# Patient Record
Sex: Male | Born: 1935
Health system: Southern US, Community
[De-identification: ages and names within clinical notes are randomized; demographics above are authoritative.]

## PROBLEM LIST (undated history)

## (undated) DIAGNOSIS — Z951 Presence of aortocoronary bypass graft: Secondary | ICD-10-CM

## (undated) DIAGNOSIS — N400 Enlarged prostate without lower urinary tract symptoms: Secondary | ICD-10-CM

## (undated) DIAGNOSIS — I471 Supraventricular tachycardia, unspecified: Secondary | ICD-10-CM

## (undated) DIAGNOSIS — E785 Hyperlipidemia, unspecified: Secondary | ICD-10-CM

## (undated) DIAGNOSIS — I252 Old myocardial infarction: Secondary | ICD-10-CM

## (undated) DIAGNOSIS — I444 Left anterior fascicular block: Secondary | ICD-10-CM

## (undated) DIAGNOSIS — M1990 Unspecified osteoarthritis, unspecified site: Secondary | ICD-10-CM

## (undated) DIAGNOSIS — M109 Gout, unspecified: Secondary | ICD-10-CM

## (undated) DIAGNOSIS — D494 Neoplasm of unspecified behavior of bladder: Secondary | ICD-10-CM

## (undated) DIAGNOSIS — F32A Depression, unspecified: Secondary | ICD-10-CM

## (undated) DIAGNOSIS — K219 Gastro-esophageal reflux disease without esophagitis: Secondary | ICD-10-CM

## (undated) DIAGNOSIS — E119 Type 2 diabetes mellitus without complications: Secondary | ICD-10-CM

## (undated) DIAGNOSIS — I2510 Atherosclerotic heart disease of native coronary artery without angina pectoris: Secondary | ICD-10-CM

## (undated) DIAGNOSIS — I350 Nonrheumatic aortic (valve) stenosis: Secondary | ICD-10-CM

## (undated) DIAGNOSIS — Z87898 Personal history of other specified conditions: Secondary | ICD-10-CM

## (undated) DIAGNOSIS — N529 Male erectile dysfunction, unspecified: Secondary | ICD-10-CM

## (undated) DIAGNOSIS — Z973 Presence of spectacles and contact lenses: Secondary | ICD-10-CM

## (undated) DIAGNOSIS — Z87448 Personal history of other diseases of urinary system: Secondary | ICD-10-CM

## (undated) DIAGNOSIS — K449 Diaphragmatic hernia without obstruction or gangrene: Secondary | ICD-10-CM

## (undated) DIAGNOSIS — R399 Unspecified symptoms and signs involving the genitourinary system: Secondary | ICD-10-CM

## (undated) DIAGNOSIS — D509 Iron deficiency anemia, unspecified: Secondary | ICD-10-CM

## (undated) DIAGNOSIS — K5730 Diverticulosis of large intestine without perforation or abscess without bleeding: Secondary | ICD-10-CM

## (undated) DIAGNOSIS — Z87438 Personal history of other diseases of male genital organs: Secondary | ICD-10-CM

## (undated) DIAGNOSIS — Z87442 Personal history of urinary calculi: Secondary | ICD-10-CM

## (undated) DIAGNOSIS — I493 Ventricular premature depolarization: Secondary | ICD-10-CM

## (undated) DIAGNOSIS — R011 Cardiac murmur, unspecified: Secondary | ICD-10-CM

## (undated) DIAGNOSIS — I10 Essential (primary) hypertension: Secondary | ICD-10-CM

## (undated) DIAGNOSIS — F329 Major depressive disorder, single episode, unspecified: Secondary | ICD-10-CM

## (undated) DIAGNOSIS — M069 Rheumatoid arthritis, unspecified: Secondary | ICD-10-CM

## (undated) DIAGNOSIS — Q2381 Bicuspid aortic valve: Secondary | ICD-10-CM

## (undated) DIAGNOSIS — Q231 Congenital insufficiency of aortic valve: Secondary | ICD-10-CM

## (undated) HISTORY — PX: TOTAL KNEE ARTHROPLASTY: SHX125

## (undated) HISTORY — DX: Supraventricular tachycardia: I47.1

## (undated) HISTORY — PX: CARDIAC CATHETERIZATION: SHX172

## (undated) HISTORY — DX: Rheumatoid arthritis, unspecified: M06.9

## (undated) HISTORY — DX: Bicuspid aortic valve: Q23.81

## (undated) HISTORY — PX: FINGER SURGERY: SHX640

## (undated) HISTORY — DX: Supraventricular tachycardia, unspecified: I47.10

## (undated) HISTORY — DX: Depression, unspecified: F32.A

## (undated) HISTORY — DX: Congenital insufficiency of aortic valve: Q23.1

## (undated) HISTORY — DX: Nonrheumatic aortic (valve) stenosis: I35.0

## (undated) HISTORY — DX: Left anterior fascicular block: I44.4

## (undated) HISTORY — PX: TRANSTHORACIC ECHOCARDIOGRAM: SHX275

## (undated) HISTORY — DX: Major depressive disorder, single episode, unspecified: F32.9

## (undated) HISTORY — DX: Male erectile dysfunction, unspecified: N52.9

## (undated) HISTORY — PX: CYSTOSCOPY WITH RETROGRADE PYELOGRAM, URETEROSCOPY AND STENT PLACEMENT: SHX5789

---
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
## 1976-12-13 HISTORY — PX: CHOLECYSTECTOMY OPEN: SUR202

## 2000-10-24 ENCOUNTER — Encounter: Payer: Self-pay | Admitting: Orthopedic Surgery

## 2000-10-24 ENCOUNTER — Ambulatory Visit (HOSPITAL_COMMUNITY): Admission: RE | Admit: 2000-10-24 | Discharge: 2000-10-24 | Payer: Self-pay | Admitting: Orthopedic Surgery

## 2001-12-13 HISTORY — PX: CATARACT EXTRACTION W/ INTRAOCULAR LENS  IMPLANT, BILATERAL: SHX1307

## 2002-06-04 ENCOUNTER — Ambulatory Visit (HOSPITAL_COMMUNITY): Admission: RE | Admit: 2002-06-04 | Discharge: 2002-06-04 | Payer: Self-pay | Admitting: Gastroenterology

## 2007-08-23 ENCOUNTER — Inpatient Hospital Stay (HOSPITAL_COMMUNITY): Admission: RE | Admit: 2007-08-23 | Discharge: 2007-08-26 | Payer: Self-pay | Admitting: Orthopedic Surgery

## 2008-03-20 ENCOUNTER — Inpatient Hospital Stay (HOSPITAL_COMMUNITY): Admission: RE | Admit: 2008-03-20 | Discharge: 2008-03-23 | Payer: Self-pay | Admitting: Orthopedic Surgery

## 2008-04-10 ENCOUNTER — Encounter: Payer: Self-pay | Admitting: Thoracic Surgery (Cardiothoracic Vascular Surgery)

## 2008-04-10 ENCOUNTER — Inpatient Hospital Stay (HOSPITAL_COMMUNITY): Admission: EM | Admit: 2008-04-10 | Discharge: 2008-04-17 | Payer: Self-pay | Admitting: Emergency Medicine

## 2008-04-10 ENCOUNTER — Ambulatory Visit: Payer: Self-pay | Admitting: Thoracic Surgery (Cardiothoracic Vascular Surgery)

## 2008-04-11 HISTORY — PX: CORONARY ARTERY BYPASS GRAFT: SHX141

## 2008-05-09 ENCOUNTER — Ambulatory Visit: Payer: Self-pay | Admitting: Thoracic Surgery (Cardiothoracic Vascular Surgery)

## 2008-05-09 ENCOUNTER — Encounter
Admission: RE | Admit: 2008-05-09 | Discharge: 2008-05-09 | Payer: Self-pay | Admitting: Thoracic Surgery (Cardiothoracic Vascular Surgery)

## 2008-05-16 ENCOUNTER — Encounter (HOSPITAL_COMMUNITY): Admission: RE | Admit: 2008-05-16 | Discharge: 2008-08-14 | Payer: Self-pay | Admitting: Cardiology

## 2010-08-13 ENCOUNTER — Inpatient Hospital Stay (HOSPITAL_COMMUNITY): Admission: EM | Admit: 2010-08-13 | Discharge: 2010-08-14 | Payer: Self-pay | Admitting: Emergency Medicine

## 2010-08-14 ENCOUNTER — Encounter (INDEPENDENT_AMBULATORY_CARE_PROVIDER_SITE_OTHER): Payer: Self-pay | Admitting: Diagnostic Radiology

## 2011-02-25 LAB — URINALYSIS, ROUTINE W REFLEX MICROSCOPIC
Bilirubin Urine: NEGATIVE
Hgb urine dipstick: NEGATIVE
Ketones, ur: NEGATIVE mg/dL
Nitrite: NEGATIVE
Urobilinogen, UA: 0.2 mg/dL (ref 0.0–1.0)
pH: 6 (ref 5.0–8.0)

## 2011-02-25 LAB — CBC
Hemoglobin: 12.8 g/dL — ABNORMAL LOW (ref 13.0–17.0)
MCH: 31.9 pg (ref 26.0–34.0)
Platelets: 175 10*3/uL (ref 150–400)
RBC: 4.01 MIL/uL — ABNORMAL LOW (ref 4.22–5.81)
WBC: 7.9 10*3/uL (ref 4.0–10.5)

## 2011-02-25 LAB — DIFFERENTIAL
Eosinophils Absolute: 0.3 10*3/uL (ref 0.0–0.7)
Lymphocytes Relative: 28 % (ref 12–46)
Lymphs Abs: 2.2 10*3/uL (ref 0.7–4.0)
Monocytes Relative: 9 % (ref 3–12)
Neutro Abs: 4.6 10*3/uL (ref 1.7–7.7)
Neutrophils Relative %: 58 % (ref 43–77)

## 2011-02-25 LAB — POCT CARDIAC MARKERS
CKMB, poc: <1 ng/mL — ABNORMAL LOW (ref 1.0–8.0)
Myoglobin, poc: 76.9 ng/mL (ref 12–200)
Troponin i, poc: <0.05 ng/mL (ref 0.00–0.09)

## 2011-02-25 LAB — POCT I-STAT, CHEM 8
BUN: 25 mg/dL — ABNORMAL HIGH (ref 6–23)
Chloride: 114 mEq/L — ABNORMAL HIGH (ref 96–112)
Creatinine, Ser: 1 mg/dL (ref 0.4–1.5)
Glucose, Bld: 109 mg/dL — ABNORMAL HIGH (ref 70–99)
Hemoglobin: 13.3 g/dL (ref 13.0–17.0)
Potassium: 3.9 mEq/L (ref 3.5–5.1)
Sodium: 144 mEq/L (ref 135–145)

## 2011-02-25 LAB — COMPREHENSIVE METABOLIC PANEL
Albumin: 3.5 g/dL (ref 3.5–5.2)
Alkaline Phosphatase: 68 U/L (ref 39–117)
BUN: 21 mg/dL (ref 6–23)
Creatinine, Ser: 1.04 mg/dL (ref 0.4–1.5)
Glucose, Bld: 108 mg/dL — ABNORMAL HIGH (ref 70–99)
Potassium: 4 mEq/L (ref 3.5–5.1)
Total Protein: 6.6 g/dL (ref 6.0–8.3)

## 2011-02-25 LAB — CARDIAC PANEL(CRET KIN+CKTOT+MB+TROPI): Relative Index: INVALID (ref 0.0–2.5)

## 2011-02-25 LAB — URINE MICROSCOPIC-ADD ON

## 2011-02-25 LAB — TSH: TSH: 1.082 u[IU]/mL (ref 0.350–4.500)

## 2011-02-25 LAB — TROPONIN I: Troponin I: 0.02 ng/mL (ref 0.00–0.06)

## 2011-02-25 LAB — CK TOTAL AND CKMB (NOT AT ARMC)
CK, MB: 1.9 ng/mL (ref 0.3–4.0)
Relative Index: INVALID (ref 0.0–2.5)
Total CK: 75 U/L (ref 7–232)

## 2011-02-25 LAB — MAGNESIUM: Magnesium: 1.8 mg/dL (ref 1.5–2.5)

## 2011-04-27 NOTE — Cardiovascular Report (Signed)
Seth Simpson, Seth Simpson                ACCOUNT NO.:  [id number]   MEDICAL RECORD NO.:  [id number]          PATIENT TYPE:  INP   LOCATION:  2905                         FACILITY:  [facility]   PHYSICIAN:  Lyn Records, M.D.   DATE OF BIRTH:  05-Nov-1931   DATE OF PROCEDURE:  DATE OF DISCHARGE:                            CARDIAC CATHETERIZATION   INDICATIONS:  This patient has been having waxing and waning chest pain  since 9:30 this morning.  Initial inferior ST elevation on EKGs at  around 1:30, had resolved to baseline by 2 o'clock p.m.  We still  brought to the cath lab to define coronary anatomy.   PROCEDURE PERFORMED:  1. Left heart catheter.  2. Selective coronary angiography.  3. Left ventriculography.   DESCRIPTION:  After informed consent, the patient was brought under  emergency circumstances.  A 6-French sheath was started on the left  femoral artery because the patient has a fresh less than 2-week right  total knee replacement.  In the cath lab, the patient was complaining of  chest pain and ST elevation noted in the ER had resolved.   A 6-French sheath was inserted and we used a 6-French right Judkins  catheter to perform right coronary angiography selectively and also left  ventriculography.  We then performed left coronary angiography with a #4  6-French left Judkins catheter.  Of note, on each engagement of the left  coronary with Judkins catheter, the patient became restless and  complained of shortness of breath.   We reviewed the digital replace in the cath suite and felt that his  anatomy was quite ambiguous.  My suspicion is that his right coronary  was the culprit vessel, although, his anatomy is such that a large  branching ramus intermedius branch could also be the culprit vessel.  With the patient not clinically stable and with him having basically  three-vessel coronary disease, I made a decision not to proceed with  multivessel PCI in this patient who is  complex.  We will start medical  therapy, reviewed the pictures with my colleague Dr. Corliss Marcus and  also have the surgeons to take a look at the patient and give [place] an  opinion about the feasibility of bypass surgery.  Each opportunity to  help the gentleman right now will be somewhat complicated by the fact  that he has recently had total knee replacement surgery.  Manual  compression was used for left groin hemostasis.   RESULTS:  1. Hemodynamic data:      a.     Aortic pressure 160/80.      b.     Left ventricular pressure 172/11.  2. Left ventriculography:  LV function is normal EF is greater than      60%.  No mitral regurgitation.  3. Coronary angiography.      a.     Left main coronary:  Appears widely patent mildly calcified.      b.     Left anterior descending coronary:  Left anterior descending       is calcified proximally.  Gives  origin to a large diagonal that       arises proximally.  There is eccentric 50%-70% LAD stenosis.  This       is also in the same region from which the first diagonal arises.       The first diagonal has 70%-75% proximal/ostial narrowing.      c.     Ramus intermedius branch:  The ramus trifurcates on the left       lateral wall and contains a 95% proximal stenosis eccentric and       could possibly be the culprit for the patient's presentation.      d.     Circumflex artery:  This vessel appears small and the       subtotally occluded with TIMI grade 3 flow to a small first obtuse       marginal branch less than 2 mm in diameter.      e.     Right coronary:  The right coronary is large vessel that       branches in the mid segment into a large codominant acute marginal       branch that courses along the anterolateral right ventricle and       then to the mid posterior and ventricular groove to supply the PDA       territory.  The right coronary also remains in the       atrioventricular groove and after the acute marginal origin        contains a segmental 95% stenosis that could also be the culprit.       The RCA then courses into the posterior interventricular groove       and supplies the proximal one third of the interventricular       groove.   CONCLUSION:  1. Aborted acute inferior myocardial infarction with spontaneous      resolution of inferior ST elevation and symptoms.  2. Ambiguous coronary anatomy with the possibility that the culprit is      the right coronary or the ramus intermedius branch.  3. Three-vessel coronary disease with high-grade mid right coronary      high-grade ramus intermedius subtotal circumflex and moderate-to-      moderately severe proximal left anterior descending and first      diagonal disease.  4. Normal left ventricular function.  5. Dilated aortic root with aortic calcification.   PLAN:  1. We will review digital images with Dr. Amil Amen.  2. We will have Dr. Jannette Spanner and see the patient from [facility] to assess the      patient's surgical possibilities.  3. We will start IV integralin.  The patient has already been given      aspirin.  We will aggressively treat his blood pressure.  We will      start IV nitroglycerin and statin therapy.  4. The patient may need to return back to the cath lab if recurrent ST      elevation, but hopefully aggressive medical therapy can cool him      off and allow [place] to make a more powerful decision about surgery      versus multivessel PCI.      Lyn Records, M.D.  Electronically Signed     HWS/MEDQ  D:  04/10/2008  T:  04/11/2008  Job:  [id number]   cc:   Francisca December, M.D.  Jaquelyn Bitter. Chabon, P.A.

## 2011-04-27 NOTE — Assessment & Plan Note (Signed)
OFFICE VISIT   Seth Simpson, Seth Simpson  DOB:  [birth year]                                        May 09, 2008  CHART #:  [id number]   HISTORY OF PRESENT ILLNESS:  The patient is status post coronary artery  bypass grafting times 5 done by Dr. Dorris Fetch April 11, 2008.  The  patient did well postoperatively with the exception of mild anemia and  an episode of atrial fibrillation which was converted back to normal  sinus rhythm prior to discharge home.  He presents today for his 3-week  followup visit.  The patient states he is feeling well and is without  complaints.  He has seen Dr. Amil Amen last Thursday who has been managing  his Coumadin.  Cardiac rehab has contacted the patient and is scheduled  to meet with them next Thursday.  The patient is ambulating 3 to 4 times  per day without difficulty.  He denies any chest pain, shortness of  breath, fevers, nausea, vomiting, lightheadedness, dizziness.  His  appetite is stable.   PHYSICAL EXAM:  Vital Signs:  Blood pressure of 135/81, pulse 69,  respirations 80 with O2 sats 90% on room air.  Respiratory:  Clear to  auscultation bilaterally.  Cardiac:  Regular rate and rhythm.  S1-S2  noted.  He does have a 2/6 to 3/6 systolic ejection murmur noted.  Abdomen:  Benign.  Extremities:  No peripheral edema noted.  He does  have an area of fluid collection at his distal AVH site on his left  lower extremity, but no cellulitic area noted to this.  It is not tender  to touch.   INCISIONS:  All incisions are clean, dry and intact and healing well.   STUDIES:  The patient had PA and lateral chest x-ray done today which is  clear.  No signs atelectasis or effusions.   IMPRESSION AND PLAN:  The patient is status post coronary artery bypass  grafting about 3 weeks post-op.  The patient is progressing well.  He  was seen and evaluated by Dr. Dorris Fetch.  Plan is for the patient to  continue all current medications and follow  up with Dr. Amil Amen as  directed.  Coumadin dosing will be based per Dr. Amil Amen.  The patient  is to follow up with his orthopedic surgeon as well pertaining to his  knee replacement.  He is encouraged to do cardiac rehab.  He is to  continue to ambulate 3 to 4 times per day.  The patient is to contact us  if he develops any fevers, opening or drainage from any of his incision  sites, or his fluid collection on his left lower extremity becomes  tender, warm to touch, or erythematous.  The patient acknowledged  understanding.  The patient is instructed he is released to drive, but  told no heavy lifting over 10 pounds for another 2 to 4 weeks.  We will  release the patient from our office and follow up as needed.  The  patient is in agreement.   Salvatore Decent Dorris Fetch, M.D.  Electronically Signed   KMD/MEDQ  D:  05/09/2008  T:  05/09/2008  Job:  [id number]   cc:   Francisca December, M.D.

## 2011-04-27 NOTE — Op Note (Signed)
Seth Simpson, Seth Simpson                ACCOUNT NO.:  [id number]   MEDICAL RECORD NO.:  [id number]          PATIENT TYPE:  INP   LOCATION:  0010                         FACILITY:  [facility]   PHYSICIAN:  Ollen Gross, M.D.    DATE OF BIRTH:  [birth year]   DATE OF PROCEDURE:  03/20/2008  DATE OF DISCHARGE:                               OPERATIVE REPORT   PREOPERATIVE DIAGNOSIS:  Osteoarthritis, right knee.   POSTOPERATIVE DIAGNOSIS:  Osteoarthritis, right knee.   PROCEDURE:  Right total knee arthroplasty.   SURGEON:  Ollen Gross, M.D.   ASSISTANT:  Alexzandrew L. Perkins, P.A.C.   ANESTHESIA:  General with postop Marcaine pain pump.   ESTIMATED BLOOD LOSS:  Minimal.   DRAINS:  None.   TOURNIQUET TIME:  30 minutes at 300 mmHg.   COMPLICATIONS:  None.   CONDITION:  Stable to recovery.   BRIEF CLINICAL NOTE:  Mr. Capri is a 77 year old male who has end  stage arthritis of the right knee with progressively worsening pain and  dysfunction.  He has had a previous successful left total knee  arthroplasty and presents now for right total knee arthroplasty.   PROCEDURE IN DETAIL:  After the successful administration of general  anesthetic, a tourniquet was placed high on the right thigh, and the  right lower extremity is prepped and draped in the usual sterile  fashion.  The extremity is wrapped in Esmarch, knee flexed, and  tourniquet inflated to 300 mmHg.  A midline incision was made with a 10  blade through the subcutaneous tissue to the level of the extensor  mechanism.  A fresh blade was used to make a medial parapatellar  arthrotomy.  Soft tissue over the proximal medial tibia was  subperiosteally elevated to the joint line with the knife and into the  semimembranosus bursa with a Cobb elevator.  The soft tissue laterally  was elevated with attention being paid to avoid the patellar tendon on  the tibial tubercle.  The patella was subluxed laterally and the knee  flexed 90  degrees.  The ACL and PCL were removed.  A drill was used to  create a starting hole in the distal femur and the canal was thoroughly  irrigated.  The 5 degrees right valgus alignment guide is placed  referencing off the posterior condyles. Rotation is marked and the block  pinned to remove 11 mm off the distal femur.  I took 11 mm because of  preop flexion contracture.  Distal femoral resection is made with an  oscillating saw.  Sizing block is placed, size 4 is the most  appropriate.  Rotation is marked at the epicondylar axis.  Size 4  cutting block is placed and the anterior, posterior, and chamfer cuts  made.   The tibia is subluxed forward and menisci removed.  Extramedullary  tibial alignment guide was placed referencing proximally at the medial  aspect of the tibial tubercle and distally along the second metatarsal  axis and tibial crest.  The block is pinned to remove 2 mm off the more  deficient medial side.  This is  a thicker resection than usual because  the medial side was very deficient.  The tibial resection is made with  an oscillating saw.  Size 4 is the most appropriate tibial component and  the proximal tibia prepared with the modular drill and keel punch for a  size 4.  Femoral preparation is completed with the intercondylar cut.   A size 4 mobile bearing tibial trial, size 4 posterior stabilized  femoral trial, and a 12.5 mm posterior stabilized rotating platform  insert trial are placed.  The 12.5 was not thick enough and I went to  15.  With 15, we had full extension with excellent varus valgus anterior  and posterior balance throughout full range of motion.  The patella was  everted and thickness measured to be 24 mm.  Freehand resection is taken  to 14 mm, a 38 template is placed, lug holes were drilled, trial patella  is placed and it tracks normally.  Osteophytes were removed from the  posterior femur with the trial in place.  All trials are removed and the   cut bone surfaces are prepared with pulsatile lavage.  The cement is  mixed and once ready for implantation, a size 4 mobile bearing tibial  tray, size 4 posterior stabilized femur, and 38 patella are cemented  into place and the patella is held with a clamp.  The trial 15 mm insert  is placed and the knee held in full extension and all extruded cement  removed.  When the cement had fully hardened, then the wound was  copiously irrigated with saline solution.  FloSeal was injected on the  posterior capsule and the permanent 15 mm posterior stabilized rotating  platform insert is placed into the tibial tray.  The FloSeal is injected  in the medial and lateral gutters and suprapatellar area.  A moist  sponge is placed and the tourniquet released for a total time of 30  minutes.  The sponge is held for two minutes then removed.  Minimal  bleeding is encountered.  That which is encountered is stopped with  electrocautery.  I then copiously irrigated again and closed the  extensor mechanism with interrupted #1 PDS.  Flexion against gravity was  135 degrees.  The subcu was closed with interrupted 2-0 Vicryl and  subcuticular running 4-0 Monocryl.  The incisions were cleaned and  dried.  The catheter for the Marcaine pain pump is placed and the pump  is initiated.  Steri-Strips were placed and he is placed into a bulky  dressing, awakened, and transferred to recovery in stable condition.      Ollen Gross, M.D.  Electronically Signed     FA/MEDQ  D:  03/20/2008  T:  03/20/2008  Job:  [id number]

## 2011-04-27 NOTE — Discharge Summary (Signed)
NAMEBERDELL, HOSTETLER                ACCOUNT NO.:  [id number]   MEDICAL RECORD NO.:  [id number]          PATIENT TYPE:  INP   LOCATION:  1621                         [facility]ACILITY:  [facility]   PHYSICIAN:  Ollen Gross, M.D.    DATE OF BIRTH:  1931-04-12   DATE OF ADMISSION:  03/20/2008  DATE OF DISCHARGE:  03/23/2008                               DISCHARGE SUMMARY   ADMISSION DIAGNOSES:  1. Osteoarthritis right knee.  2. Hypertension.  3. Coronary arterial disease.  4. Gastroesophageal reflux disease.   DISCHARGE DIAGNOSES:  1. Osteoarthritis right knee status post right total knee      arthroplasty.  2. Mild postop blood loss anemia.  Did not require transfusion.  Remainder of discharge diagnoses same as admitting diagnoses.   PROCEDURE:  March 20, 2008 right total knee surgery by Dr. Merlyn Albert  assisted by Patrica Duel, P.A.-C.  Anesthesia general.  Tourniquet time 30 minutes.   CONSULTATIONS:  None.   BRIEF HISTORY:  Mr. Hollings is a 77 year old male with end-stage  arthritis of the right knee with progressive  worsening pain and  dysfunction with a previous successful left total knee, now presents for  a right total knee.   LABORATORY DATA:  Preop CBC:  Hemoglobin 14.6, hematocrit of 41.6, white  cell count 6.4.  Platelets 189.  Hemoglobin dropped to 11.6 postop and  then to 9.8 with a hematocrit of 27.0.  Hemoglobin stabilized.  Last  known H&H was 9.4, hematocrit stabilized at 27.4.  PT PTT preop 13.5 and  31 respectively.  INR 1.0  Serial pro times all less than PT INR 32.2  and 3.0.  CHEM panel on admission all within normal limits.  Serial  albumins and all electrolytes remained within normal limits.  Preop UA  negative.  Blood retype O positive.  EKG:  The date on the EKG is cut off by the fax.  It was sent over by  [facility].  Said normal EKG.  Just unable to read the full date.   HOSPITAL COURSE:  The patient was admitted to [facility].  Tolerated  procedure well.  Later transferred from recovery room to  orthopedic floor.  Started on PCA and pain analgesics.  Given Coumadin  for DVT prophylaxis.  Given 24 hour postop IV antibiotics  postoperatively.  Had a fair amount of pain through the night  and had  to increase his PCA, but he was doing a little bit better.  Started  working on his pain control.  Had good output the next morning, over  300.  Hemoglobin was stable.  Blood pressure was a little low, so we put  his blood pressure medications on parameters and used fluids.  Serum  glucose was elevated so we took the D5 out and changed him over to  normal saline.  Got up and did extremely well with therapy that day.  On  postop day #1, walked about 120 feet.  Doing much better by day #2.  Sleeping well.  Pain was under better control.  Hemoglobin was down a  little bit to 9.8,  but he was asymptomatic with it.  Dressing was  changed and incision looked good.  Continued to progress well.  Walking  over 100 feet and then the next day 180 feet, and then 200 feet.  Progressed very well with physical therapy, and by March 23, 2008, he  was doing well.  No complaints.  He was discharged home.   DISCHARGE PLAN:  Patient was discharged home  on March 23, 2008.   DISCHARGE DIAGNOSIS:  Please see above.   DISCHARGE MEDICATIONS:  Percocet, Robaxin, Nu-Iron and Coumadin.   DIET:  Heart healthy diet.   ACTIVITY:  Weightbearing as tolerated.  Right leg total knee protocol.  Home health PT and home health nursing.  Followup two weeks.   DISPOSITION:  Home.   CONDITION ON DISCHARGE:  Improved.      Alexzandrew L. Perkins, P.A.C.      Ollen Gross, M.D.  Electronically Signed    ALP/MEDQ  D:  04/25/2008  T:  04/25/2008  Job:  [id number]   cc:   Ollen Gross, M.D.  Fax: [phone]

## 2011-04-27 NOTE — Discharge Summary (Signed)
NAME:  Seth Simpson, Seth Simpson                ACCOUNT NO.:  [id number]   MEDICAL RECORD NO.:  [id number]          PATIENT TYPE:  INP   LOCATION:  2015                         FACILITY:  [facility]   PHYSICIAN:  Salvatore Decent. Dorris Fetch, M.D.DATE OF BIRTH:  1930/12/22   DATE OF ADMISSION:  04/10/2008  DATE OF DISCHARGE:  04/17/2008                               DISCHARGE SUMMARY   ADMITTING DIAGNOSES:  1. ST-segment elevation myocardial infarction.  2. History of coronary artery disease.  3. History of hypertension.  4. History of hypercholesterolemia.  5. History of benign prostatic hypertrophy.  6. History of degenerative arthritis.  7. Status post right total knee replacement 3 weeks ago.   DISCHARGE DIAGNOSES:  1. ST-segment elevation myocardial infarction.  2. History of coronary artery disease.  3. History of hypertension.  4. History of hypercholesterolemia.  5. History of benign prostatic hypertrophy.  6. History of degenerative arthritis.  7. Status post right total knee replacement 3 weeks ago.  8. Postop atrial fibrillation.   PROCEDURES:  1. Cardiac catheterization performed on April 10, 2008, by Dr. Verdis Prime.  2. Coronary artery bypass graft x5 (left internal mammary artery to      ramus intermedius, saphenous vein graft to diagonal 1, saphenous      vein graft to left anterior descending, sequential saphenous vein      graft to acute marginal and distal right coronary with endoscopic      vein harvest of the left thigh, and open harvest of the right lower      extremity done on April 11, 2008, by Dr. Dorris Fetch).   HOSPITAL COURSE STAY:  This is a 77 year old male with known coronary  artery disease, who is status post right total knee replacement  approximately 3 weeks ago.  Several days prior to his admission to [facility], he had a brief episode of substernal chest discomfort, which  slowly resolved.  On the morning of April 10, 2008, he developed severe  substernal  chest pain that became rather persistent and he was brought  via EMS to [facility].  A 12-lead EKG showed ST-segment  elevation inferiorly.  The patient was given aspirin and nitroglycerin  and his chest pain resolved.  He then underwent a cardiac  catheterization on April 10, 2008, and he was found to have multivessel  coronary artery disease (50-70% LAD stenosis, 70-75% proximal ostial  narrowing of diagonal 1, 95% proximal stenosis of the ramus intermedius,  95% stenosis of the RCA, and left ventricular function was 60%.  The  patient underwent CABG x5.  Postoperatively, the patient was anemic.  His H&H was monitored closely.  He did remain asymptomatic.  Chest tubes  were removed on Apr 12, 2008.  Followup x-ray on Apr 13, 2008, revealed no  pneumothorax.  The patient had brief atrial flutter on Apr 13, 2008, and  then developed atrial fibrillation with RVR.  He was placed on an  amiodarone drip and then placed on p.o. amiodarone.  Coumadin was also  initiated.  He then converted to normal sinus  rhythm.  In addition, he  was volume overloaded.  He was diuresed daily.  The patient's H&H did  drop to 7.3 and 21.6 on postop day #4 (Apr 15, 2008).  He was given 2  units of packed red blood cells.  The patient was doing well with  cardiac rehab and continuing to pursue to the postop day #5.  He had  some sternal incisional pain and headache as well as some right great  toe pain, which he attributed to gout (which he had a previous history).  The patient was afebrile.  He remained hemodynamically stable.  His  weight was 67.8 kg, down from his preop weight of 70 kg.  His H&H was  10.1 and 30 respectively.  PT and INR were 20.4 and 1.7.   PHYSICAL EXAMINATION:  CARDIOVASCULAR:  Regular rate and rhythm.  Grade  1 systolic murmur.  PULMONARY:  Clear to auscultation bilaterally.  EXTREMITIES:  Trace edema.  His sternal and lower extremity wounds were  clean and dry.  No sign of  infection.  Positive bilateral thigh  ecchymoses.  The patient is doing well and surgically stable.   He is most likely going to be discharged on Apr 17, 2008.  Last chest x-  ray was done on Apr 13, 2008, which showed left lower lobe atelectasis  and tiny bilateral pleural effusions.  No pneumothorax.   LAST LABORATORY STUDIES:  CBC done Apr 16, 2008, H&H of 10.1 and 30  respectively, white count D100, and platelet count 237,000.  BMET done  on Apr 16, 2008, potassium 40, BUN and creatinine were 11 and 0.92  respectively.  PT and INR as stated previously 20.4 and 1.7  respectively.   DISCHARGE INSTRUCTIONS:  The patient is not to drive or lift more than  10 pounds until instructed otherwise.  He is to continue with breathing  exercise (uses incentive spirometer daily, to walk daily and increase  the number of time and length of walks as tolerated).  He is to remain  on low-fat, low-salt diet.   FOLLOWUP APPOINTMENTS:  His followup appointments include Dr. Amil Amen in  2 weeks.  He needs to call their office for an appointment.  Dr.  Dorris Fetch on May 09, 2008.  A chest x-ray will be obtained prior to  this appointment.  He will need to have a PT and INR drawn on Thursday,  results faxed to Dr. Amil Amen' office.  We will continue to monitor his  PT and INR and adjust his Coumadin accordingly.   DISCHARGE MEDICATIONS:  Include the following:  1. Terazosin 5 mg p.o. daily.  2. Ranitidine 10 mg p.o. daily.  3. ECASA 1 mg p.o. daily.  4. Methocarbamol 500 mg p.o. q.i.d. p.r.n.  5. Hydrocodone 5/500, 1-2 tablets p.o. q.4-6 h. p.r.n. pain.  6. Coumadin 2 mg p.o. daily or as directed.  7. Zocor 20 mg p.o. at bedtime.  8. Toprol-XL 50 mg p.o. daily.  9. Amiodarone 400 mg p.o. twice daily.  On Apr 19, 2008, the patient is      to take 200 mg p.o. twice daily until he sees his cardiologist.      The patient's epicardial pacing wires will be removed today and      chest tube sutures removed in  the a.m.      Doree Fudge, PA      Viviann Spare C. Dorris Fetch, M.D.  Electronically Signed    DZ/MEDQ  D:  04/16/2008  T:  04/17/2008  Job:  [id number]

## 2011-04-27 NOTE — H&P (Signed)
Seth Simpson, Seth Simpson                ACCOUNT NO.:  [id number]   MEDICAL RECORD NO.:  [id number]          Simpson TYPE:  INP   LOCATION:  0010                         FACILITY:  [facility]   PHYSICIAN:  Ollen Gross, M.D.    DATE OF BIRTH:  1931-02-19   DATE OF ADMISSION:  08/23/2007  DATE OF DISCHARGE:                              HISTORY & PHYSICAL   CHIEF COMPLAINT:  Left knee pain.   HISTORY OF PRESENT ILLNESS:  Seth Simpson is a 77 year old male who has  been seen by Dr. Lequita Halt for ongoing left knee pain.  Seth Simpson has known  arthritis and found to have some bone on bone in medial compartment. his  pain has been progressive in nature and now at a point where Seth Simpson would  like to have something done about it.  Risks and benefits of Seth total  knee replacement have been discussed with Seth Simpson.  Seth Simpson elects to  proceed with surgery.   ALLERGIES:  NO KNOWN DRUG ALLERGIES.   CURRENT MEDICATIONS:  Imdur, Zocor, Hytrin Lotrel, Toprol and Claritin.   PAST MEDICAL HISTORY:  Hypertension, coronary arterial disease,  gastroesophageal reflux disease.   PAST SURGICAL HISTORY:  Appendectomy, vasectomy, ACL knee reconstructive  surgery, gallbladder and lens implants.   SOCIAL HISTORY:  Married.  Retired.  Nonsmoker.  Occasional glass of  wine.  Two children.  Spouse will be assisting with care after surgery.   FAMILY HISTORY:  Father with a history of heart disease.  Brother with a  history of heart disease.  Mother with arthritis and hypertension.  Sister with breast cancer and another brother with COPD.   REVIEW OF SYSTEMS:  GENERAL:  No fevers, chills, night sweats.  NEURO:  No seizures, syncope or paralysis.  Respiratory: No shortness of breath,  productive cough or hemoptysis.  CARDIOVASCULAR: Seth Simpson had a cardiac  event approximately 12 ago.  Seth Simpson did have a cardiac catheterization and  found to have some nonobstructive coronary arterial disease.  Seth Simpson denies  any chest pain, angina, orthopnea.  GI:  No nausea, vomiting, diarrhea or  constipation.  GU:  Little bit of urgency, nocturia.  No dysuria or  hematuria.  MUSCULOSKELETAL:  Left knee.   PHYSICAL EXAM:  VITAL SIGNS:  Pulse 68, respirations 12, blood pressure  138/76.  GENERAL:  A 77 year old white male, well nourished, well developed, in  no acute distress.  Good historian.  Accompanied by his wife.  Seth Simpson is  alert, oriented and cooperative.  HEENT:  Normocephalic, atraumatic.  Pupils are round and reactive.  Oropharynx clear.  EOM's Intact.  NECK:  Supple.  CHEST: Clear anterior and posterior chest walls.  No rhonchi, rales or  wheezing.  HEART: Regular rate and rhythm with grade II/VI systolic ejection murmur  heard over aortic and also Seth mitral and tricuspid points of Seth heart,  anterior chest.  ABDOMEN:  Soft, flat, nontender.  Bowel sounds present.  BREASTS/GENITALIA:  Not done, not pertinent to present illness.  EXTREMITIES:  Left knee, no effusion, slight varus malalignment  deformity.  Seth Simpson does have marked crepitus noted on passive  range of  motion.  No instability.   IMPRESSION:  1. Osteoarthritis left knee.  2. Hypertension.  3. Coronary arterial disease.  4. Gastroesophageal reflux disease   PLAN:  Seth Simpson admitted to [facility] to undergo a left  total knee replacement arthroplasty.  Surgery will be performed by Dr.  Ollen Gross.      Alexzandrew L. Perkins, P.A.C.      Ollen Gross, M.D.  Electronically Signed    ALP/MEDQ  D:  08/22/2007  T:  08/23/2007  Job:  [id number]   cc:   Candyce Churn, M.D.  Fax: [phone]   Ollen Gross, M.D.  Fax: [phone]

## 2011-04-27 NOTE — Op Note (Signed)
NAMEEDEN, RHO                ACCOUNT NO.:  [id number]   MEDICAL RECORD NO.:  [id number]          PATIENT TYPE:  INP   LOCATION:  0010                         FACILITY:  [facility]   PHYSICIAN:  Ollen Gross, M.D.    DATE OF BIRTH:  1931-11-21   DATE OF PROCEDURE:  08/23/2007  DATE OF DISCHARGE:                               OPERATIVE REPORT   PREOPERATIVE DIAGNOSIS:  Osteoarthritis left knee.   POSTOPERATIVE DIAGNOSIS:  Osteoarthritis left knee.   PROCEDURE:  Left total knee arthroplasty.   SURGEON:  Ollen Gross, M.D.   ASSISTANT:  Avel Peace PA-C   ANESTHESIA:  Spinal with Duramorph.   ESTIMATED BLOOD LOSS:  Minimal.   DRAINS:  None.   TOURNIQUET TIME:  28 minutes at 300 mmHg.   COMPLICATIONS:  None.   CONDITION:  Stable to recovery.   BRIEF CLINICAL NOTE:  Mr. Schuenemann is a 77 year old male with severe end-  stage arthritis left knee with progressively worsening pain and  dysfunction.  He has failed nonoperative management and presents now for  left total knee arthroplasty.   PROCEDURE IN DETAIL:  After successful administration of spinal  anesthetic, a tourniquet is placed on the left thigh and left lower  extremity prepped and draped in usual sterile fashion.  Extremity was  wrapped in Esmarch, knee flexed, tourniquet inflated 300 mmHg.  Midline  incision made an 10 blade through subcutaneous tissue to the level of  the extensor mechanism.  Fresh blade used make a medial parapatellar  arthrotomy.  Soft tissue over the proximal medial tibia subperiosteally  elevated to the joint line with the knife and into the semimembranosus  bursa with a Cobb elevator.  Soft tissue laterally is elevated with  attention being paid to avoiding patellar tendon on tibial tubercle.  Patella subluxed laterally, knee flexed 90 degrees, ACL and PCL removed.  Drill was used to create a starting hole in the distal femur and the  canal was thoroughly irrigated.  5 degrees left valgus  alignment guide  was placed and referencing off the posterior condyles rotations marked  and the block pinned to remove 10 mm off the distal femur.  Distal  femoral resection is made with an oscillating saw.  Sizing blocks placed  and size 4 is most appropriate.  Rotation is marked off the epicondylar  axis and the size 4 cutting block placed.  The anterior, posterior and  chamfer cuts are made.   Tibia subluxed forward and menisci are removed.  Extramedullary tibial  alignment guides placed referencing proximally at the medial aspect of  the tibial tubercle and distally along the second metatarsal axis and  tibial crest.  Blocks pinned to remove 10 mm off the non deficient  lateral side.  Tibial resection is made with an oscillating saw.  Size 4  is the most appropriate tibial component and the proximal tibia is  prepared with the modular drill and keel punch for a size 4.  Femoral  preparation is completed with intercondylar cut.   Size 4 mobile bearing tibial trial, size 4 posterior stabilized femoral  trial  and a 10 mL posterior stabilized rotating platform insert trial  are placed.  With a 10 he has a tiny bit of varus-valgus play, so I went  to 12.5 which led to outstanding stability from full flexion down  through full extension.  We then everted the patella and the thickness  was measured to be 24 mm.  Freehand resection taken to 14 mm, 38  template is placed, lug holes were drilled, trial patella was placed and  it tracks normally.  Osteophytes were removed off the posterior femur  with the trial in place.  All trials removed and the cut bone surfaces  are prepared with pulsatile lavage.  Cement was mixed and once ready for  implantation a size 4 mobile bearing tibial trial, size 4 posterior  stabilized femoral trial and a 12.5 mm posterior stabilized rotating  platform insert trial are placed.  The knee was held in full extension  and all extruded cement removed.  We then also  cemented the 38 patella  in and held it with patellar clamp.  Once the cement is hardened, then  we removed the trial and thoroughly irrigated the joint.  FloSeal was  placed on the posterior capsule and the permanent 12.5 mm posterior  stabilized rotating platform insert is placed.  We then placed the  FloSeal in the suprapatellar area, medial and lateral gutters.  Moist  sponge is placed and the tourniquet released with a total time of 28  minutes.  Then we held pressure about two minutes, removed the sponge  and minimal bleeding was encountered.  Any encountered bleeding was  stopped with electrocautery.  Wound again irrigated and the arthrotomy  closed with interrupted #1 PDS.  Flexion against gravity to 140 degrees.  Subcu was closed with interrupted 2-0 Vicryl subcuticular running 4-0  Monocryl.  The incisions cleaned and dried and Steri-Strips and bulky  sterile dressing applied.  Placed into a knee immobilizer, awakened and  transferred to recovery in stable condition.      Ollen Gross, M.D.  Electronically Signed     FA/MEDQ  D:  08/23/2007  T:  08/24/2007  Job:  [id number]

## 2011-04-27 NOTE — Op Note (Signed)
NAME:  Winningham, Wilkie                ACCOUNT NO.:  [id number]   MEDICAL RECORD NO.:  [id number]          PATIENT TYPE:  INP   LOCATION:  2312                         FACILITY:  [facility]   PHYSICIAN:  Salvatore Decent. Dorris Fetch, M.D.DATE OF BIRTH:  [birth year]   DATE OF PROCEDURE:  04/11/2008  DATE OF DISCHARGE:                               OPERATIVE REPORT   PREOPERATIVE DIAGNOSIS:  Three-vessel coronary artery disease status  post acute myocardial infarction.   POSTOPERATIVE DIAGNOSIS:  Three-vessel coronary artery disease status  post acute myocardial infarction.   PROCEDURE:  1. Median sternotomy, extracorporeal circulation.  2. Coronary artery bypass grafting x5 (left internal mammary artery to      ramus intermedius, saphenous vein graft to first diagonal,      saphenous vein graft to left anterior descending, sequential      saphenous vein graft to acute marginal and distal right coronary).  3. Endoscopic vein harvest of the left thigh combination endoscopic      and open vein harvest right thigh.   SURGEON:  Salvatore Decent. Dorris Fetch, MD.   ASSISTANT:  Allyn Kenner, MD.   SECOND ASSISTANT:  Doree Fudge, PA.   ANESTHESIA:  General.   FINDINGS:  Hematuria with bleeding around the catheter noted on patient  arrival to room.  Catheter was left in place.  Good-quality coronary  targets for the ramus intermedius, diagonal and LAD, acute marginal, and  distal right coronary with severe plaquing at the bifurcation with fair-  quality target vessels.  Atypical course for the distal right coronary  artery.  Large segments of saphenous vein not acceptable for use as  bypass grafts.  Adequate vein was found for the three bypasses after  harvesting vein from the left leg and right thigh.   CLINICAL NOTE:  Mr. Kritikos is a 77 year old gentleman who recently had  total knee replacement.  He presented to the emergency room with  complaints of substernal chest pain.  He initially had  ST elevation.  He  was taken emergently to the catheterization laboratory by Dr. Verdis Prime and was found to have severe three-vessel disease but only  moderate disease in the LAD.  He stabilized medically and did not  require emergent intervention.  He was seen in consultation by Dr.  Tressie Stalker who recommended coronary bypass grafting with me  performing the surgery today.  The patient was in agreement.  The  patient was seen by Dr. Corliss Marcus on the morning of surgery who  reviewed the films, discussed with Dr. Katrinka Blazing and concurred with our  assessment.  The indications, risks, benefits and alternatives were  discussed with the patient.  He understood and accepted the risks and  agreed to proceed.   OPERATIVE NOTE:  Mr. Seelman was brought to the preop holding area on  April 30, 75.  There, the Anesthesia Service under the direction of  Dr. Judie Petit placed lines for monitoring arterial, central  venous, and pulmonary arterial pressure.  Intravenous antibiotics were  administered.  The patient was taken to the operating room, anesthetized  and intubated.  There it was noted to be blood around the Foley catheter  and notable hematuria.  The catheter was left in place and not replaced  with a temperature probe catheter.  The chest, abdomen and legs were  prepped and draped in the usual sterile fashion.   Incision was made in the medial aspect of the left leg at the level of  the knee.  The greater saphenous vein was identified.  It was harvested  endoscopically from groin to mid calf.  After removing the vein and  inspecting only a small portion of this vein was acceptable for use as a  bypass graft.  Simultaneously with this median sternotomy was performed  in the left internal mammary artery was harvested using standard  technique.  A 2000 units of heparin was administered during the vessel  harvest.  Next, an attempt was made to harvest the saphenous vein from  the  right leg endoscopically.  Incision was made just below the knee.  The vein was identified and a scope was inserted.  Vein was tracked  proximally and there was a proximal portion of the vein which was  suitable for use as a bypass graft, however, there was a long sclerotic  portion in the mid portion of the vein which was not easily accessible  endoscopically.  Bridged incisions then were used to harvest the  remainder of the vein.  Again, there was sufficient vein for the bypass  grafts to be utilized.   The patient then was fully heparinized and the pericardium was opened.  The ascending aorta was inspected with no evidence of atherosclerotic  disease.  The aorta was cannulated via concentric two Ethibond pledgeted  pursestring sutures.  A dual stage venous cannula was placed via  pursestring suture in the right atrial appendage.  Cardiopulmonary  bypass was instituted.  The patient was cooled to 32 degrees Celsius.  The coronary arteries were inspected and anastomotic sites were chosen.  The conduits were inspected and cut to length.  A foam pad was placed in  the pericardium to protect the left phrenic nerve.  A temperature probe  was placed in the myocardial septum and a cardioplegic cannula was  placed in the ascending aorta.  Of note, this dissection of the distal  right coronary in the usual location near the takeoff with a typical  posterior descending artery revealed no artery in that location.  Decision was made to dissect out the right coronary more proximally  after cross-clamping the heart.   The aorta was cross-clamped.  The left ventricle was emptied via the  aortic root vent.  Cardiac arrest then was achieved with a combination  of cold antegrade blood cardioplegia and topical iced saline.  A 1300 mL  of cardioplegia was administered.  Myocardial septal temperature was 10  degrees Celsius.  The following distal anastomoses were performed.   First, a reversed saphenous  vein graft was placed end-to-side to the  LAD.  This was in the mid portion of the vessel high on the anterior  wall.  It was a 2.5-mm good-quality targeted site.  Vein graft was good  quality.  The anastomosis was performed with a running 7-0 Prolene  suture.  Each anastomosis was probed proximally and distally to ensure  patency and each vein graft they were flushed with heparinized saline to  assess flow and then cardioplegia was administered to assess flow and  hemostasis.   Next, a reverse saphenous vein graft was placed end-to-side  of the first  diagonal branch of the LAD.  This was a 1.5-mm good-quality target.  The  vein graft was good quality.  The anastomosis was performed with a  running 7-0 Prolene suture and there was good flow and good hemostasis.   Next, the left internal mammary artery was brought through a window in  the pericardium.  The distal end was beveled and was anastomosed end-to-  side to the ramus intermedius.  This was 2-mm good-quality target.  It  was superficially intramyocardial.  The mammary to ramus anastomosis was  performed with a running 8-0 Prolene suture.  At completion of  anastomosis, the bulldog clamps were briefly removed to inspect for  hemostasis and was replaced.  The mammary pedicle was tacked to the  epicardial surface of the heart with 6-0 Prolene sutures.   Additional cardioplegia was administered and dissection was carried out.  The acute marginal branch was identified and was dissected retrograde.  There was a large fat pad in the bifurcation of the distal right and  acute marginal was deep within the fat pad in the atrioventricular  groove.  The bifurcation was identified.  There was a large plaque at  this site which involved also the takeoff of the acute marginal which  was not tight by angiographic criteria but there was a significant  burden of disease in that area.  Therefore, the decision was made to  sequentially bypass the  acute marginal and the distal right coronary.  A  side-to-side anastomosis was performed to the acute marginal which was a  1.5-mm vessel.  A fair-quality of the vein graft was anastomosed in side-  to-side with a running 7-0 Prolene suture.  The distal end of the vein  then was cut to length and then anastomosed end-to-side to the distal  right coronary.  After opening the distal right coronary, probe would  not pass proximally due to the tight stenosis.  There was heavy  calcification in this area.  Passing the probe distally, the vessel did  course much more posterior than normal and the probe could be palpated  within the right atrium adjacent to the atrioventricular groove.  This  anastomosis, likewise was performed with a running 7-0 Prolene suture.  There was good flow through the graft.  Cardioplegia was administered.  There was good hemostasis.   Additional cardioplegia then was administered.  The vein grafts were cut  to length.  The cardioplegic cannula was removed from the ascending  aorta.  The proximal vein graft anastomoses were performed to 4.0-mm  punch aortotomies with running 6-0 Prolene sutures.  After completion of  the final proximal anastomosis, the patient was placed in Trendelenburg  position.  Lidocaine was administered.  The bulldog clamps removed from  the left mammary artery after de-airing the aortic root.  The aortic  cross-clamp was removed.  Total cross-clamp time was 94 minutes.   While the patient being rewarmed, all proximal and distal anastomoses  were inspect for hemostasis.  Epicardial pacing wires were placed on the  right ventricle and right atrium.  The patient rewarmed to a core  temperature of 37 degrees Celsius.  He was weaned from cardiopulmonary  bypass on the first attempt.  He was DDD paced but on no inotropic  support on  separation from bypass.  Total bypass time was 140 minutes.  The patient did not require inotropic support.   At  test dose of protamine was administered and was well tolerated.  The  atrial and aortic cannulae were removed.  The remainder of the protamine  was administered without incident.  The chest was irrigated with 1 L of  warm normal saline containing 1 g of vancomycin.  Hemostasis was  achieved.  Left pleural and two mediastinal chest tubes were placed  through separate subcostal incisions.  The pericardium was  reapproximated with interrupted 3-0 silk sutures.  It came together  easily without tension.  The sternum was closed with a combination of  single, simple and figure-of-eight stainless steel wires.  The  pectoralis fascia, subcutaneous tissue and skin were closed in standard  fashion.  All sponge, needle and instrument counts were correct at the  end of the procedure.  The patient was transported from the operating  room to the Surgical Intensive Care Unit in fair condition.      Salvatore Decent Dorris Fetch, M.D.  Electronically Signed     SCH/MEDQ  D:  04/11/2008  T:  04/12/2008  Job:  [id number]   cc:   Francisca December, M.D.  Candyce Churn, M.D.

## 2011-04-27 NOTE — Consult Note (Signed)
[name], Seth Simpson                ACCOUNT NO.:  [id number]   MEDICAL RECORD NO.:  [id number]          PATIENT TYPE:  INP   LOCATION:  2905                         FACILITY:  [facility]   PHYSICIAN:  Salvatore Decent. Cornelius Moras, M.D. DATE OF BIRTH:  09-Aug-1931   DATE OF CONSULTATION:  DATE OF DISCHARGE:                                 CONSULTATION   REQUESTING PHYSICIAN:  Lyn Records, MD.   PRIMARY CARDIOLOGIST:  Francisca December, MD.   PRIMARY CARE PHYSICIAN:  Candyce Churn, MD.   REASON FOR CONSULTATION:  Three-vessel coronary artery disease with  acute coronary syndrome.   HISTORY OF PRESENT ILLNESS:  [name] is a 77 year old gentleman  from [place] with known history of coronary artery disease, dates  back about 13 years ago.  At that time, he apparently underwent cardiac  catheterization and was treated medically.  No interventions were  performed.  The patient has done well since then until recently.  The  patient underwent left total knee replacement in August 2008 and  recovered uneventfully.  The patient underwent right total knee  replacement 3 weeks ago from today by Dr. Ollen Gross.  This procedure  was initially tolerated quite well and the patient has recovered  uneventfully so far.  The patient states that several days ago, he had a  brief episode of substernal chest discomfort that resolved.  This  morning he developed severe substernal chest pain, stopped and started  and then became quite severe and persistent, prompting him to summon  EMS.  Upon initial arrival of EMS, a 12-lead electrocardiogram was  performed demonstrating ST-segment elevation inferiorly.  The patient  was administered aspirin and nitroglycerin.  His symptoms resolved.  He  was brought to the emergency room where baseline 12-lead  electrocardiograms were without ischemic change.  The patient was  brought to the cardiac cath lab for cardiac catheterization by Dr.  Katrinka Blazing.  Findings at the  time of catheterization demonstrate severe three-  vessel coronary artery disease with normal left ventricular function.  Cardiothoracic surgery consultation was requested.   REVIEW OF SYSTEMS:  GENERAL:  The patient overall has been doing quite  well.  His appetite has been normal.  He has otherwise been recovering  uneventfully following his recent knee surgery.  CARDIAC:  The patient  does report one brief episode of chest discomfort several days ago, but  otherwise he has not had any chest discomfort suspicious for angina in  many years.  The patient has fairly good exercise tolerance and has  otherwise been limited only by his knees up until now.  He denies  significant exertional shortness of breath.  He reports no resting  shortness of breath, PND, orthopnea, or lower extremity edema.  RESPIRATORY:  Negative.  The patient denies productive cough,  hemoptysis, wheezing.  GASTROINTESTINAL:  Negative.  The patient's  appetite has recovered following his knee surgery.  Bowel function has  been regular.  He denies hematochezia, hematemesis, melena.  MUSCULOSKELETAL:  Notable for some soreness and mild swelling in his  right knee related to his recent surgery.  He has been actively  involving in rehab and overall getting along well.  He has been  ambulating and using a cane to some degree although recently he has been  ambulating some without the cane for assistance and he has no difficulty  bearing weight.  GENITOURINARY:  Notable for some symptoms of mild  prostatic hypertrophy.  The patient denies urinary urgency or frequency.  HEENT:  Negative.  INFECTIOUS:  Negative   PAST MEDICAL HISTORY:  1. Coronary artery disease.  2. Hypertension.  3. Hypercholesterolemia.  4. Benign prostatic hypertrophy.  5. Degenerative arthritis.   PAST SURGICAL HISTORY:  1. Right total knee replacement 3 weeks ago.  2. Left total knee replacement, August 2008.  3. Cholecystectomy approximately 30  years ago.  4. Appendectomy in the distant past.   FAMILY HISTORY:  Noncontributory.   SOCIAL HISTORY:  The patient is retired from [employer] and lives here in  [place] with his wife.  He has remained active physically.  He has a  remote history of tobacco use, although he quit smoking more than 40  years ago.  He denies alcohol consumption.   MEDICATIONS PRIOR TO ADMISSION:  Include Imdur, Claritin-D, terazosin,  aspirin, Coumadin (2 mg daily), metoprolol, Endocet, methocarbamol, and  Hydrocodone.   DRUG ALLERGIES:  None known.   PHYSICAL EXAMINATION:  GENERAL:  The patient is a thin well-appearing  male who appears his stated age or perhaps even younger and is in no  acute distress.  He is currently in normal sinus rhythm.  He has not had  any chest pain since arrival to the hospital.  HEENT:  Exam is grossly unrevealing.  NECK:  Supple.  There is no cervical or supraclavicular lymphadenopathy.  There is no jugular venous distention.  No carotid bruits are noted.  CHEST:  Auscultation of chest demonstrates clear breath sounds  anteriorly.  No wheezes or rhonchi noted.  CARDIOVASCULAR:  Exam includes regular rate and rhythm.  No murmurs,  rubs or gallops noted.  ABDOMEN:  Flat, soft, nondistended, nontender.  Bowel sounds are  present.  EXTREMITIES:  The patient has just been cathed in the left femoral  artery.  There does not appear to be a hematoma.  The patient has a  surgical incision on the anterior surface of the right knee that is  healing nicely.  There is a well-healed scar on the left knee.  There is  mild swelling around the right knee.  There is no lower extremity edema.  Distal pulses are palpable in both lower legs at the ankle.  RECTAL:  Both deferred.  GU:  Both deferred.  NEUROLOGIC:  Grossly nonfocal and symmetrical throughout.   LABORATORY DATA:  Blood work obtained this admission include hemoglobin  12.3, hematocrit 36%, white blood count 12,000, platelet  count 466,000.  Sodium 135, potassium 4.4, chloride 106, bicarb 21, BUN 16, creatinine  0.9, glucose 136.  Prothrombin time 19.3 seconds, INR 1.6, PTT 32  seconds.   DIAGNOSTIC TEST:  Cardiac catheterization performed by Dr. Katrinka Blazing is  reviewed and this demonstrates three-vessel coronary artery disease with  normal left ventricular function.  There is 60% tubular stenosis of  proximal left anterior descending coronary artery with 90% ostial  stenosis of the diagonal branch.  There is a very large ramus  intermediate branch with 95% proximal stenosis.  There is 90% stenosis  of the proximal left circumflex coronary artery, although the terminal  branches of the circumflex are quite small after the  ramus intermediate.  There is 90% stenosis of mid right coronary artery.  Left ventricular  function appears preserved.   IMPRESSION:  Severe three-vessel coronary artery disease with normal  left ventricular function, sudden onset acute coronary syndrome now 3  weeks following right total knee replacement.  Although, multivessel  percutaneous coronary intervention is an option, I suspect that Mr.  Agerton would best be treated with surgical revascularization.   PLAN:  I have discussed options at length with Mr. Walker and his  family here in the cath lab holding area.  Dr. Katrinka Blazing has requested that  we hold off until Dr. Corliss Marcus has the opportunity to review the  cath films for further consultation.  We could tentatively plan to  proceed with surgery tomorrow by Dr. Dorris Fetch if all are in agreement  with proceeding with surgical revascularizations.  Mr. Urizar and his  family understand all potential associated risks of surgery including  but not limited to risk of death, stroke, myocardial infarction,  congestive heart failure, respiratory failure, pneumonia, bleeding  requiring blood transfusion, arrhythmia, infection, recurrent coronary  artery disease.  All their questions has  been addressed.      Salvatore Decent. Cornelius Moras, M.D.  Electronically Signed     CHO/MEDQ  D:  04/10/2008  T:  04/11/2008  Job:  [id number]

## 2011-04-30 NOTE — H&P (Signed)
Seth Simpson, Seth Simpson                ACCOUNT NO.:  [id number]   MEDICAL RECORD NO.:  [id number]          PATIENT TYPE:  INP   LOCATION:  0010                         FACILITY:  [facility]   PHYSICIAN:  Ollen Gross, M.D.    DATE OF BIRTH:  June 20, 1931   DATE OF ADMISSION:  03/20/2008  DATE OF DISCHARGE:                              HISTORY & PHYSICAL   CHIEF COMPLAINT:  Right knee pain.   HISTORY OF PRESENT ILLNESS:  The patient is a 77 year old white male  well-known to Dr. Ollen Gross and had previously undergone a left  total knee back on September 2008 doing well.  He has known arthritis in  both knees and presents for the right knee which has continued be  problematic and symptomatic.  The patient subsequently admitted to the  hospital.   ALLERGIES:  No known drug allergies.   CURRENT MEDICATIONS:  Imdur, Zocor, Hytrin, Lotrel, Toprol, Claritin,  Prilosec.   PAST MEDICAL HISTORY:  Hypertension, coronary arterial disease,  gastroesophageal reflux disease.   PAST SURGICAL HISTORY:  Appendectomy, vasectomy, ACL reconstructive  surgery, gallbladder surgery, lens implants, and left total knee.   SOCIAL HISTORY:  Married, retired, nonsmoker, occasional glass of wine.  Two children.  Spouse will be assisting with care.   FAMILY HISTORY:  Father with history of heart disease.  Brother with  history of heart disease.  Mother with arthritis and hypertension.  Sister with breast cancer.  Another brother with COPD.   REVIEW OF SYSTEMS:  General:  No fevers, chills or night sweats.  Neuro:  No seizures or paralysis.  Respiratory:  No shortness of breath,  productive cough or hemoptysis.  Cardiovascular: No chest pain,  orthopnea.  He did have a cardiac catheterization approximately 12 years  ago and had nonobstructive coronary artery disease.  GI: No nausea or  constipation.  GU: No dysuria or discharge.  Musculoskeletal: Right  knee.   PHYSICAL EXAM:  VITAL SIGNS:  Pulse 64,  respirations 12, blood pressure  148/70.  GENERAL: 77 year old white male well-nourished, well-developed, small  frame no acute distress.  HEENT:  Normocephalic, atraumatic.  Pupils are equal, round, and  reactive.  EOMs intact.  Neck is supple.  Faint bruits are noted.  CHEST:  Clear anterior posterior chest walls.  No rhonchi, rales or  wheezing.  HEART:  Regular rate and rhythm with a grade 3/6 systolic ejection  murmur heard throughout but best over aortic and mitral point.  ABDOMEN:  Soft, nontender.  Bowel sounds present.  BREASTS AND GENITALIA:  Not done not per the present illness.  EXTREMITIES:  Right knee range of motion 0 to 125.  Marked crepitus.  Tender more medial.   IMPRESSION:  1. Osteoarthritis right knee.  2. Hypertension.  3. Coronary arterial disease.  4. Gastroesophageal reflux disease.   PLAN:  The patient admitted to [facility] to undergo a right  total knee replacement arthroplasty.  Surgery will be performed by Dr.  Ollen Gross.      Alexzandrew L. Perkins, P.A.C.      Ollen Gross, M.D.  Electronically Signed  ALP/MEDQ  D:  03/20/2008  T:  03/20/2008  Job:  [id number]

## 2011-04-30 NOTE — Procedures (Signed)
[facility]  Patient:    Seth Simpson, Seth Simpson Visit Number: [id number] MRN: [id number]          Service Type: END Location: ENDO Attending Physician:  Dennison Bulla Ii Dictated by:   Verlin Grills, M.D. Proc. Date: 06/04/02 Admit Date:  06/04/2002 Discharge Date: 06/04/2002   CC:         Pearla Dubonnet, M.D.   Procedure Report  PROCEDURE:  Colonoscopy.  INDICATIONS FOR PROCEDURE:  The patient is a 77 year old male who is referred for his screening colonoscopy with polypectomy to prevent colon cancer.  I discussed with him the complications associated with colonoscopy and polypectomy including a 15 per 1000 risk of bleeding and 4 per 1000 risk of colon perforation requiring surgical repair.  The patient has signed the operative permit.  ENDOSCOPIST:  Verlin Grills, M.D.  PREMEDICATION:  Versed 5 mg and Demerol 50 mg.  ENDOSCOPE:  Olympus pediatric colonoscope.  DESCRIPTION OF PROCEDURE:  After obtaining informed consent, the patient was placed in the left lateral decubitus position.  I administered intravenous Demerol and intravenous Versed to achieve conscious sedation for the procedure.  The patients blood pressure, oxygen saturation, and cardiac rhythm were monitored throughout the procedure, and documented in the medical record.  Anal inspection was normal.  Digital rectal exam was normal.  The prostate was enlarged, but non-nodular.  The Olympus pediatric video colonoscope was introduced into the rectum and advanced to the cecum.  Colonic preparation for the exam today was excellent.  Rectum normal.  Sigmoid colon and descending colon:  Diverticulosis.  Splenic flexure normal.  Transverse colon normal.  Hepatic flexure normal.  Ascending colon normal.  Cecum and ileocecal valve normal.  ASSESSMENT:  Left colonic diverticulosis; otherwise normal proctocolonoscopy to the cecum.  No endoscopic evidence for  the presence of colorectal neoplasia. Dictated by:   Verlin Grills, M.D. Attending Physician:  Dennison Bulla Ii DD:  06/04/02 TD:  06/05/02 Job: [id number]

## 2011-05-04 NOTE — Discharge Summary (Signed)
NAMEJATHEN, Seth Simpson                ACCOUNT NO.:  [id number]   MEDICAL RECORD NO.:  [id number]          PATIENT TYPE:  INP   LOCATION:  1604                         FACILITY:  [facility]   PHYSICIAN:  Ollen Gross, M.D.    DATE OF BIRTH:  [birth year]   DATE OF ADMISSION:  08/23/2007  DATE OF DISCHARGE:  08/26/2007                               DISCHARGE SUMMARY   ADMITTING DIAGNOSES:  1. Osteoarthritis, left knee.  2. Hypertension.  3. Coronary arterial disease.  4. Gastroesophageal reflux disease.   DISCHARGE DIAGNOSES:  1. Osteoarthritis, left knee, status post left total knee replacement      arthroplasty.  2. Hypertension.  3. Coronary arterial disease.  4. Gastroesophageal reflux disease.   PROCEDURE PERFORMED:  On August 23, 2007, left total knee, surgeon  Dr. Lequita Halt, assistant Avel Peace, PA-C.  Spinal anesthesia.   CONSULTATIONS:  None.   HISTORY OF PRESENT ILLNESS:  The patient is a 77 year old male who has  been seen by Dr. Lequita Halt for ongoing knee pain that has been refractory  to nonoperative management and now presents for total knee arthroplasty.   LABORATORY DATA:  Preop CBC showed a hemoglobin 15.3, hematocrit of  44.9, red cell count 4.58, platelets 208.  Chemistry panel with slightly  elevated glucose 108 with remaining chemistry panel within normal  limits.  Preop UA negative except for trace of blood.  PT/INR preop 12.1  and 0.9 respectively with a PTT of 31.  Serial CBCs were followed, but  hemoglobin did drop down to 11.3 with last known H&H 10.6 and 30.3.  Serial protimes followed with last PT/INR 24.4 and 2.1.  Serial BMETs  were followed and electrolytes remained within normal limits.  Blood  group type O positive.   Preop EKG sinus rhythm at 58, normal EKG confirmed by Dr. Kevan Ny. Two-  view chest on August 17, 2007, no active disease.   HOSPITAL COURSE:  The patient was admitted to [facility] and  tolerated the procedure well and  later transferred to the recovery room  and orthopedic floor.  He was started on PCA and p.o. analgesic for pain  control following surgery and 24 hours postop IV antibiotics.  He was  started on Coumadin for DVT prophylaxis.  Actually, he was doing fairly  well on the morning of day #1, except for some pain, encouraged p.o.  medications and weaned off of the PCA.  Started back on home  medications.  He started to get up out of bed.  By day #2, the dressing  was changed and incision looked good.  He had excellent output.  Urinary  output was good, so we discontinued the fluids.  Hemoglobin was stable  with no complaints from a therapy standpoint.  He started getting up  doing more mobility walking about 50 feet on the morning of day #2, but  then got up to about 200 feet on that afternoon.  He continued to  progress well and was ready to go home by the following day of August 26, 2007.   DISPOSITION:  The patient is  discharged home on August 26, 2007.   DISCHARGE MEDICATIONS:  Percocet, Robaxin, Coumadin.   DIET:  Low sodium, heart-healthy diet.   ACTIVITY:  Weightbearing as tolerated in left lower extremity.  Home  health PT.  Home health nursing.  Total knee protocol.  Follow up in 2  weeks.   CONDITION ON DISCHARGE:  Improved.      Seth Simpson, P.A.C.      Ollen Gross, M.D.  Electronically Signed    ALP/MEDQ  D:  09/15/2007  T:  09/16/2007  Job:  [id number]   cc:   Candyce Churn, M.D.  Fax: [phone]

## 2011-09-07 LAB — URINALYSIS, ROUTINE W REFLEX MICROSCOPIC
Bilirubin Urine: NEGATIVE
Glucose, UA: NEGATIVE
Hgb urine dipstick: NEGATIVE
Nitrite: NEGATIVE
Specific Gravity, Urine: 1.024
Specific Gravity, Urine: 1.033 — ABNORMAL HIGH
Urobilinogen, UA: 1
pH: 5.5

## 2011-09-07 LAB — POCT I-STAT, CHEM 8
BUN: 11
Chloride: 109
Potassium: 4.5
Sodium: 140

## 2011-09-07 LAB — BASIC METABOLIC PANEL
CO2: 25
CO2: 21
Calcium: 8.4
Calcium: 8.5
Chloride: 106
Chloride: 106
Creatinine, Ser: 0.91
GFR calc Af Amer: >60
GFR calc Af Amer: >60
GFR calc non Af Amer: 56 — ABNORMAL LOW
Potassium: 4.3
Potassium: 4.7
Sodium: 135
Sodium: 139
Sodium: 135

## 2011-09-07 LAB — PROTIME-INR
INR: 1
INR: 1.8 — ABNORMAL HIGH
INR: 1.6 — ABNORMAL HIGH
Prothrombin Time: 13.5
Prothrombin Time: 19.3 — ABNORMAL HIGH
Prothrombin Time: 24.8 — ABNORMAL HIGH

## 2011-09-07 LAB — TYPE AND SCREEN
ABO/RH(D): O POS
ABO/RH(D): O POS
Antibody Screen: NEGATIVE

## 2011-09-07 LAB — CBC
HCT: 27 — ABNORMAL LOW
HCT: 27.4 — ABNORMAL LOW
HCT: 33.1 — ABNORMAL LOW
HCT: 31.9 — ABNORMAL LOW
HCT: 36 — ABNORMAL LOW
Hemoglobin: 11.6 — ABNORMAL LOW
Hemoglobin: 14.6
Hemoglobin: 9.4 — ABNORMAL LOW
Hemoglobin: 9.8 — ABNORMAL LOW
Hemoglobin: 10.9 — ABNORMAL LOW
Hemoglobin: 12.3 — ABNORMAL LOW
MCHC: 35
MCHC: 36.1 — ABNORMAL HIGH
MCHC: 34.2
MCHC: 34.6
MCV: 95.6
MCV: 96.5
MCV: 97.5
MCV: 94.7
MCV: 95.3
MCV: 96.1
Platelets: 169
Platelets: 466 — ABNORMAL HIGH
RBC: 2.83 — ABNORMAL LOW
RBC: 4.31
RBC: 2.98 — ABNORMAL LOW
RBC: 3.32 — ABNORMAL LOW
RBC: 3.77 — ABNORMAL LOW
RDW: 12.7
RDW: 12.7
RDW: 12.9
RDW: 13
RDW: 14.1
WBC: 12.3 — ABNORMAL HIGH
WBC: 10.2
WBC: 12 — ABNORMAL HIGH

## 2011-09-07 LAB — DIFFERENTIAL
Basophils Absolute: 0
Basophils Relative: 0
Eosinophils Absolute: 0
Eosinophils Relative: 0
Lymphocytes Relative: 10 — ABNORMAL LOW
Lymphs Abs: 1.1
Monocytes Absolute: 0.9
Monocytes Relative: 8
Neutro Abs: 9.8 — ABNORMAL HIGH
Neutrophils Relative %: 82 — ABNORMAL HIGH

## 2011-09-07 LAB — BLOOD GAS, ARTERIAL
Patient temperature: 98.6
pH, Arterial: 7.436

## 2011-09-07 LAB — APTT
aPTT: 31
aPTT: 32

## 2011-09-07 LAB — COMPREHENSIVE METABOLIC PANEL
Alkaline Phosphatase: 78
BUN: 12
CO2: 25
CO2: 26
Calcium: 9.3
Chloride: 107
Creatinine, Ser: 0.91
Creatinine, Ser: 0.99
GFR calc Af Amer: >60
GFR calc non Af Amer: >60
GFR calc non Af Amer: >60
Glucose, Bld: 103 — ABNORMAL HIGH
Potassium: 4.4
Total Bilirubin: 1.1
Total Protein: 6.7

## 2011-09-07 LAB — LIPID PANEL
HDL: 21 — ABNORMAL LOW
Triglycerides: 121
VLDL: 24

## 2011-09-07 LAB — URINE MICROSCOPIC-ADD ON

## 2011-09-07 LAB — ABO/RH: ABO/RH(D): O POS

## 2011-09-07 LAB — POCT I-STAT 4, (NA,K, GLUC, HGB,HCT)
Glucose, Bld: 121 — ABNORMAL HIGH
Glucose, Bld: 129 — ABNORMAL HIGH
Glucose, Bld: 95
HCT: 21 — ABNORMAL LOW
HCT: 33 — ABNORMAL LOW
Hemoglobin: 8.5 — ABNORMAL LOW
Hemoglobin: 9.9 — ABNORMAL LOW
Operator id: 274841
Operator id: 3291
Operator id: 3402
Operator id: 3402
Operator id: 3402
Potassium: 4.2
Potassium: 5.1
Sodium: 140
Sodium: 140
Sodium: 140
Sodium: 141

## 2011-09-07 LAB — POCT I-STAT GLUCOSE: Glucose, Bld: 115 — ABNORMAL HIGH

## 2011-09-07 LAB — HEMOGLOBIN AND HEMATOCRIT, BLOOD
HCT: 24.5 — ABNORMAL LOW
Hemoglobin: 8.4 — ABNORMAL LOW

## 2011-09-07 LAB — POCT I-STAT 3, ART BLOOD GAS (G3+)
Acid-base deficit: 2
Bicarbonate: 21.7
Bicarbonate: 21.8
Bicarbonate: 24.2 — ABNORMAL HIGH
O2 Saturation: 100
Operator id: 297641
Patient temperature: 36.6
TCO2: 23
TCO2: 26
pCO2 arterial: 36.9
pH, Arterial: 7.376
pH, Arterial: 7.38
pO2, Arterial: 122 — ABNORMAL HIGH

## 2011-09-07 LAB — HEMOGLOBIN A1C: Mean Plasma Glucose: 126

## 2011-09-07 LAB — BASIC METABOLIC PANEL WITH GFR
BUN: 16
Calcium: 9
GFR calc non Af Amer: >60
Glucose, Bld: 136 — ABNORMAL HIGH
Potassium: 4.4

## 2011-09-07 LAB — CARDIAC PANEL(CRET KIN+CKTOT+MB+TROPI)
Relative Index: 12.8 — ABNORMAL HIGH
Troponin I: 3.83

## 2011-09-07 LAB — PLATELET COUNT: Platelets: 310

## 2011-09-07 LAB — POCT CARDIAC MARKERS
CKMB, poc: <1 — ABNORMAL LOW
Troponin i, poc: <0.05

## 2011-09-07 LAB — HEPARIN LEVEL (UNFRACTIONATED): Heparin Unfractionated: 0.18 — ABNORMAL LOW

## 2011-09-24 LAB — BASIC METABOLIC PANEL
BUN: 8
CO2: 27
Calcium: 8.7
Calcium: 8.8
Creatinine, Ser: 1.23
GFR calc Af Amer: >60
GFR calc non Af Amer: >60
Potassium: 4.2

## 2011-09-24 LAB — PROTIME-INR
INR: 1.1
INR: 1.9 — ABNORMAL HIGH
INR: 2.1 — ABNORMAL HIGH
Prothrombin Time: 14.7
Prothrombin Time: 22.4 — ABNORMAL HIGH
Prothrombin Time: 24.4 — ABNORMAL HIGH

## 2011-09-24 LAB — TYPE AND SCREEN
ABO/RH(D): O POS
Antibody Screen: NEGATIVE

## 2011-09-24 LAB — CBC
HCT: 32.1 — ABNORMAL LOW
MCHC: 34.9
MCV: 97
Platelets: 150
RBC: 3.13 — ABNORMAL LOW
RBC: 3.59 — ABNORMAL LOW
WBC: 12.1 — ABNORMAL HIGH
WBC: 13.7 — ABNORMAL HIGH
WBC: 15.2 — ABNORMAL HIGH

## 2011-09-24 LAB — ABO/RH: ABO/RH(D): O POS

## 2012-04-06 ENCOUNTER — Encounter (HOSPITAL_COMMUNITY)
Admission: RE | Disposition: A | Discharge status: Home / Self Care | Payer: Self-pay | Source: Ambulatory Visit | Attending: Gastroenterology

## 2012-04-06 ENCOUNTER — Encounter (HOSPITAL_COMMUNITY): Payer: Self-pay

## 2012-04-06 ENCOUNTER — Ambulatory Visit (HOSPITAL_COMMUNITY)
Admission: RE | Admit: 2012-04-06 | Discharge: 2012-04-06 | Disposition: A | Discharge status: Home / Self Care | Payer: Medicare Other | Source: Ambulatory Visit | Attending: Gastroenterology | Admitting: Gastroenterology

## 2012-04-06 DIAGNOSIS — K222 Esophageal obstruction: Secondary | ICD-10-CM | POA: Insufficient documentation

## 2012-04-06 DIAGNOSIS — K5730 Diverticulosis of large intestine without perforation or abscess without bleeding: Secondary | ICD-10-CM | POA: Insufficient documentation

## 2012-04-06 DIAGNOSIS — N400 Enlarged prostate without lower urinary tract symptoms: Secondary | ICD-10-CM | POA: Insufficient documentation

## 2012-04-06 DIAGNOSIS — K219 Gastro-esophageal reflux disease without esophagitis: Secondary | ICD-10-CM | POA: Insufficient documentation

## 2012-04-06 DIAGNOSIS — K449 Diaphragmatic hernia without obstruction or gangrene: Secondary | ICD-10-CM | POA: Insufficient documentation

## 2012-04-06 DIAGNOSIS — I10 Essential (primary) hypertension: Secondary | ICD-10-CM | POA: Insufficient documentation

## 2012-04-06 DIAGNOSIS — Z7982 Long term (current) use of aspirin: Secondary | ICD-10-CM | POA: Insufficient documentation

## 2012-04-06 DIAGNOSIS — I2510 Atherosclerotic heart disease of native coronary artery without angina pectoris: Secondary | ICD-10-CM | POA: Insufficient documentation

## 2012-04-06 DIAGNOSIS — E7800 Pure hypercholesterolemia, unspecified: Secondary | ICD-10-CM | POA: Insufficient documentation

## 2012-04-06 DIAGNOSIS — Z79899 Other long term (current) drug therapy: Secondary | ICD-10-CM | POA: Insufficient documentation

## 2012-04-06 DIAGNOSIS — Z951 Presence of aortocoronary bypass graft: Secondary | ICD-10-CM | POA: Insufficient documentation

## 2012-04-06 DIAGNOSIS — D509 Iron deficiency anemia, unspecified: Secondary | ICD-10-CM | POA: Insufficient documentation

## 2012-04-06 DIAGNOSIS — Z87891 Personal history of nicotine dependence: Secondary | ICD-10-CM | POA: Insufficient documentation

## 2012-04-06 DIAGNOSIS — Z96659 Presence of unspecified artificial knee joint: Secondary | ICD-10-CM | POA: Insufficient documentation

## 2012-04-06 HISTORY — PX: ESOPHAGOGASTRODUODENOSCOPY: SHX5428

## 2012-04-06 HISTORY — PX: COLONOSCOPY: SHX5424

## 2012-04-06 HISTORY — DX: Cardiac murmur, unspecified: R01.1

## 2012-04-06 HISTORY — PX: BALLOON DILATION: SHX5330

## 2012-04-06 HISTORY — DX: Essential (primary) hypertension: I10

## 2012-04-06 HISTORY — DX: Gastro-esophageal reflux disease without esophagitis: K21.9

## 2012-04-06 HISTORY — DX: Atherosclerotic heart disease of native coronary artery without angina pectoris: I25.10

## 2012-04-06 SURGERY — COLONOSCOPY
Anesthesia: Moderate Sedation

## 2012-04-06 MED ORDER — METOPROLOL TARTRATE 1 MG/ML IV SOLN
5.0000 mg | Freq: Once | INTRAVENOUS | Status: AC
  Administered 2012-04-06: 5 mg via INTRAVENOUS

## 2012-04-06 MED ORDER — MIDAZOLAM HCL 10 MG/2ML IJ SOLN
INTRAMUSCULAR | Status: AC
  Filled 2012-04-06: qty 2

## 2012-04-06 MED ORDER — SODIUM CHLORIDE 0.9 % IV SOLN
Freq: Once | INTRAVENOUS | Status: AC
  Administered 2012-04-06: 15:00:00 via INTRAVENOUS

## 2012-04-06 MED ORDER — METOPROLOL TARTRATE 1 MG/ML IV SOLN
INTRAVENOUS | Status: AC
  Filled 2012-04-06: qty 5

## 2012-04-06 MED ORDER — FENTANYL NICU IV SYRINGE 50 MCG/ML
INJECTION | INTRAMUSCULAR | Status: DC | PRN
  Administered 2012-04-06 (×2): 25 ug via INTRAVENOUS

## 2012-04-06 MED ORDER — MIDAZOLAM HCL 10 MG/2ML IJ SOLN
INTRAMUSCULAR | Status: DC | PRN
  Administered 2012-04-06 (×2): 2.5 mg via INTRAVENOUS

## 2012-04-06 MED ORDER — FENTANYL CITRATE 0.05 MG/ML IJ SOLN
INTRAMUSCULAR | Status: AC
  Filled 2012-04-06: qty 2

## 2012-04-06 MED ORDER — BUTAMBEN-TETRACAINE-BENZOCAINE 2-2-14 % EX AERO
INHALATION_SPRAY | CUTANEOUS | Status: DC | PRN
  Administered 2012-04-06: 2 via TOPICAL

## 2012-04-06 NOTE — Op Note (Signed)
Procedure: Diagnostic esophagogastroduodenoscopy with dilation of a benign distal esophageal stricture.  Endoscopist: Danise Edge  Premedication: Versed 5 mg intravenously. Fentanyl 50 mcg intravenously.  Procedure: The patient was placed in the left lateral decubitus position. The Pentax gastroscope was passed through the posterior hypopharynx into the proximal esophagus without difficulty. The hypopharynx, larynx, and vocal cords appeared normal.  Esophagoscopy: The proximal and mid segments of the esophageal mucosa appeared normal. The squamocolumnar junction is noted at approximately 37 cm from the incisor teeth. There is a benign peptic stricture at the esophagogastric junction. There is no endoscopic evidence for the presence of erosive esophagitis or Barrett's esophagus. A benign peptic stricture at the esophagogastric junction was dilated to 16.5 mm using the esophageal balloon dilator.  Gastroscopy: Retroflex view of the gastric cardia and fundus was normal. There is a moderate-sized hiatal hernia. The gastric body, antrum, and pylorus appeared normal.  Duodenoscopy: The duodenal bulb and descending duodenum appeared normal.  Assessment: A benign peptic stricture at the esophagogastric junction was dilated to 16.5 mm using the esophageal balloon dilator. A moderate-sized hiatal hernia is present. Otherwise the esophagogastroduodenoscopy was normal and without signs of gastrointestinal bleeding.  Plan: Proceed with diagnostic colonoscopy to evaluate iron deficiency anemia.

## 2012-04-06 NOTE — Discharge Instructions (Addendum)
Endoscopy Care After Please read the instructions outlined below and refer to this sheet in the next few weeks. These discharge instructions provide you with general information on caring for yourself after you leave the hospital. Your doctor may also give you specific instructions. While your treatment has been planned according to the most current medical practices available, unavoidable complications occasionally occur. If you have any problems or questions after discharge, please call your doctor. HOME CARE INSTRUCTIONS Activity  You may resume your regular activity but move at a slower pace for the next 24 hours.   Take frequent rest periods for the next 24 hours.   Walking will help expel (get rid of) the air and reduce the bloated feeling in your abdomen.   No driving for 24 hours (because of the anesthesia (medicine) used during the test).   You may shower.   Do not sign any important legal documents or operate any machinery for 24 hours (because of the anesthesia used during the test).  Nutrition  Drink plenty of fluids.   You may resume your normal diet.   Begin with a light meal and progress to your normal diet.   Avoid alcoholic beverages for 24 hours or as instructed by your caregiver.  Medications You may resume your normal medications unless your caregiver tells you otherwise. What you can expect today  You may experience abdominal discomfort such as a feeling of fullness or "gas" pains.   You may experience a sore throat for 2 to 3 days. This is normal. Gargling with salt water may help this.  Follow-up Your doctor will discuss the results of your test with you. SEEK IMMEDIATE MEDICAL CARE IF:  You have excessive nausea (feeling sick to your stomach) and/or vomiting.   You have severe abdominal pain and distention (swelling).   You have trouble swallowing.   You have a temperature over 100 F (37.8 C).   You have rectal bleeding or vomiting of blood.    Document Released: 07/13/2004 Document Revised: 11/18/2011 Document Reviewed: 01/24/2008 [employer] Patient Information 2012 [employer]. 

## 2012-04-06 NOTE — Consult Note (Signed)
Admit date: 04/06/2012 Referring Physician  Dr. Laural Benes Primary Physician Pearla Dubonnet, MD, MD Primary Cardiologist  [facility]  Reason for Consultation  evaluation of tachycardia  HPI:  78 year old male with coronary artery disease status post bypass surgery, bicuspid aortic valve, PVCs with recent anemia. I saw him recently in clinic and had him evaluated with echocardiogram which demonstrated a normal ejection fraction. He underwent endoscopy by Dr. Laural Benes successfully. Uneventful. While in recovery, a wide complex tachycardia at 149 beats per minute was noted, regular. Khyler, endoscopy nurse, had him bear down and after second Valsalva, tachycardia successfully terminated.  I personally viewed EKG and the QRS complexes are slightly wider than his native QRS complexes and most likely resemble aberrant conduction with underlying SVT. I do not see A-V dissociation present. He was completely asymptomatic. No chest pain, no shortness of breath, no dizziness. In review of his notes, in 2009 he had his Toprol stopped because of bradycardia. He had one isolated syncopal episode in the distant past. This workup led to his bypass surgery. A second EKG done in the endoscopy showed frequent PVCs. This was similar to the EKG that was seen in the office setting. I went ahead and gave him 5 mg of Lopressor here in endoscopy which helped decrease PVC burden. I prescribed him 25 mg of metoprolol extended release to be taken at home. Given his asymptomatic nature, I felt comfortable allowing him to go home but clearly demonstrated  To him and his wife that if he has any warning signs, near syncope or anything like this that he is to immediately seek medical attention. I will also set him up with an appointment on Monday to see Cristopher Peru nurse practitioner in our clinic.  PMH:   Past Medical History  Diagnosis Date  . Coronary artery disease   . Myocardial infarction   . Angina   . Hypertension   . Heart  murmur   . Shortness of breath   . Anemia   . GERD (gastroesophageal reflux disease)   . Arthritis     PSH:   Past Surgical History  Procedure Date  . Cardiac catheterization   . Joint replacement   . Coronary artery bypass graft   . Intraocular lens insertion 2003  . Replacement total knee bilateral 2008   Allergies:  Codeine Prior to Admit Meds:   Prescriptions prior to admission  Medication Sig Dispense Refill  . amLODipine-benazepril (LOTREL) 5-20 MG per capsule Take 1 capsule by mouth daily.      Marland Kitchen aspirin 81 MG tablet Take 81 mg by mouth daily.      . colchicine 0.6 MG tablet Take 0.6 mg by mouth 2 (two) times daily.      Marland Kitchen geriatric multivitamins-minerals (ELDERTONIC/GEVRABON) ELIX Take 15 mLs by mouth daily.      Marland Kitchen loratadine (CLARITIN) 10 MG tablet Take 10 mg by mouth daily.      Marland Kitchen omeprazole (PRILOSEC) 20 MG capsule Take 20 mg by mouth daily.      . rosuvastatin (CRESTOR) 10 MG tablet Take 10 mg by mouth daily.      . sertraline (ZOLOFT) 50 MG tablet Take 50 mg by mouth daily.      . sildenafil (REVATIO) 20 MG tablet Take 10 mg by mouth 3 (three) times daily.      Marland Kitchen terazosin (HYTRIN) 5 MG capsule Take 5 mg by mouth at bedtime.       Fam HX:   History reviewed. No pertinent family  history. Social HX:    History   Social History  . Marital Status: Married    Spouse Name: N/A    Number of Children: N/A  . Years of Education: N/A   Occupational History  . Not on file.   Social History Main Topics  . Smoking status: Not on file  . Smokeless tobacco: Not on file  . Alcohol Use: 1.2 oz/week    2 Glasses of wine per week  . Drug Use: No  . Sexually Active: Yes   Other Topics Concern  . Not on file   Social History Narrative  . No narrative on file     ROS:  anemia, does not feel palpitations. No melena. All 11 ROS were addressed and are negative except what is stated in the HPI  Physical Exam: Blood pressure 104/81, temperature 98.1 F (36.7 C),  temperature source Oral, resp. rate 13, height 5\' 10"  (1.778 m), weight 68.04 kg (150 lb), SpO2 100.00%.    General: Well developed, well nourished, in no acute distress Head: Eyes PERRLA, No xanthomas.   Normal cephalic and atramatic  Lungs:   Clear bilaterally to auscultation and percussion. Normal respiratory effort. No wheezes, no rales. Heart:   HRRR S1 S2 Pulses are 2+ & equal. Occasional ectopy            No carotid bruit. No JVD.  No abdominal bruits. No femoral bruits. Abdomen: Bowel sounds are positive, abdomen soft and non-tender without masses. No hepatosplenomegaly. Msk:  Back normal, normal gait. Normal strength and tone for age. Extremities:   No clubbing, cyanosis or edema.  DP +1 Neuro: Alert and oriented X 3, non-focal, MAE x 4 GU: Deferred Rectal: Deferred Psych:  Good affect, responds appropriately    Labs:   Lab Results  Component Value Date   WBC 7.9 08/13/2010   HGB 13.3 08/13/2010   HCT 39.0 08/13/2010   MCV 95.8 08/13/2010   PLT 175 08/13/2010   No results found for this basename: NA,K,CL,CO2,BUN,CREATININE,CALCIUM,LABALBU,PROT,BILITOT,ALKPHOS,ALT,AST,GLUCOSE in the last 168 hours No results found for this basename: PTT   Lab Results  Component Value Date   INR 2.2* 04/11/2008   INR 1.6* 04/10/2008   INR 3.0* 03/23/2008   Lab Results  Component Value Date   CKTOTAL 63 08/13/2010   CKMB 1.6 08/13/2010   TROPONINI  Value: 0.01        NO INDICATION OF MYOCARDIAL INJURY. 08/13/2010     Lab Results  Component Value Date   CHOL  Value: 102        ATP III CLASSIFICATION:  <200     mg/dL   Desirable  914-782  mg/dL   Borderline High  >=956    mg/dL   High 01/26/864   Lab Results  Component Value Date   HDL 21* 04/11/2008   Lab Results  Component Value Date   LDLCALC  Value: 57        Total Cholesterol/HDL:CHD Risk Coronary Heart Disease Risk Table                     Men   Women  1/2 Average Risk   3.4   3.3 04/11/2008   Lab Results  Component Value Date   TRIG 121  04/11/2008   Lab Results  Component Value Date   CHOLHDL 4.9 04/11/2008   No results found for this basename: LDLDIRECT    EKG:  As described above.  Personally viewed.   ASSESSMENT/PLAN:  78 year old with coronary artery disease, prior bypass, bicuspid aortic valve, normal ejection fraction, frequent PVCs, anemia of unexplained etiology with recently discovered wide-complex tachycardia, paroxysmal at heart rate of 149 beats per minute, personally witnessed, terminated with Valsalva.  Supraventricular tachycardia-etiology of wide-complex tachycardia is most likely supraventricular tachycardia As described above. One cannot fully exclude ventricular tachycardia however there is no evidence of A-V dissociation and the QRS morphology resembles the native QRS morphology but wider. Was normal. I have gone ahead and placed him on metoprolol extended release 25 mg once a day. I monitored him in endoscopy after receiving 5 mg of Lopressor and he did well. If he does have significant bradycardia, we may have to contemplate pacemaker in order to effectively treat his SVT. I have discussed this with he and his wife. He has had issues with bradycardia in the past. I will have prompt followup with him. His recent electrolytes were normal. Once again, he has not had any syncope at home nor anginal-like symptoms.  I will let him be discharged from endoscopy with close followup.  Donato Schultz, MD  04/06/2012  6:26 PM

## 2012-04-06 NOTE — Op Note (Signed)
Procedure: Diagnostic colonoscopy to evaluate iron deficiency anemia.  Endoscopist: Danise Edge  Premedication: Versed 5 mg intravenously. Fentanyl 50 mcg intravenously.  Procedure: Anal inspection was normal. Digital rectal exam reveals an enlarged hard prostate. The Pentax pediatric colonoscope was introduced into the rectum and advanced to the cecum. A normal-appearing ileocecal valve and appendiceal orifice were identified. Colonic preparation for the exam today was good.  Rectum. Normal. Retroflex view of the distal rectum normal.  Sigmoid colon and descending colon. Extensive left colonic diverticulosis.  Splenic flexure. Normal.  Transverse colon. Normal.  Hepatic flexure. Normal.  Descending colon. Normal.  Cecum and ileocecal valve normal.  Assessment: Normal diagnostic proctocolonoscopy to the cecum except for the presence of left colonic diverticulosis. No signs of colorectal neoplasia or lower gastrointestinal bleeding to explain the patient's iron deficiency anemia.

## 2012-04-06 NOTE — Brief Op Note (Signed)
At 1725 recovery R asked me to take a look at Mr. Cottrells heart rate. Mr Bean appeared to be in a  wide complex tachycardia (SVT) he was comfortable and hemodynamically stable. Dr Laural Benes and Dr Anne Fu were contacted. A12 lead EKG was obtained during the SVT. I asked the pt to bare down as if he was having a bowel movement and this vagle manuver was successful in breaking his SVT. Pt was in a SR with pacs and pvcs, Dr Anne Fu arrived in the dept to evaluate the pt.

## 2012-04-06 NOTE — H&P (Signed)
Problems: Unexplained iron deficiency anemia, heartburn, dysphagia.  History: [name] is a 78 year old male born [birth year]. The patient has unexplained iron deficiency anemia, heartburn, and esophageal dysphagia unassociated with odynophagia. He chronically takes aspirin, Naprosyn, and sertraline which are associated with gastrointestinal bleeding. He denies a past history of peptic ulcer disease. He denies hematemesis, hematochezia, and the passage of melenic-appearing stool.  The patient underwent a normal screening colonoscopy in June 2003. He received intravenous Versed 5 mg and intravenous Demerol 50 mg for conscious sedation.  The patient is scheduled to undergo a diagnostic esophagogastroduodenoscopy with possible balloon dilation of an esophageal stricture and diagnostic colonoscopy.  Chronic medications: Claritin. Multivitamin. Omeprazole. Naprosyn. Aspirin. Viagra. Flaxseed oil. Crestor. Colchicine. Amlodipine. Terazocin. Sertraline. Iron.  Past medical and surgical history: Coronary artery disease. Coronary artery bypass grafting. Hypercholesterolemia. Hypertension. Erectile dysfunction. Depression. Decreased libido. Prostatitis. Benign prostatic hypertrophy. Gastroesophageal reflux disease. Bicuspid aortic valve. Mild aortic valve stenosis. Bradycardia and syncope in September 2011. Bilateral knee replacement surgery. Laparotomy with cholecystectomy. Appendectomy. Bilateral cataract surgery.  Habits: The patient quit smoking cigarettes in 1965. He consumes alcohol in moderation.  Allergies: Peanuts. Adhesive tape. Hydrochlorothiazide. Splenda.  Exam: The patient is alert and lying comfortably on the endoscopy stretcher. Mouth and throat appear normal. Lungs are clear to auscultation. Cardiac exam reveals a regular rhythm with early grade 2/6 systolic murmur consistent with bicuspid aortic valve and mild aortic stenosis. Abdomen is soft, flat, and nontender to palpation.  Plan:  Proceed with diagnostic esophagogastroduodenoscopy, possible esophageal stricture dilation, and diagnostic colonoscopy.

## 2012-04-07 ENCOUNTER — Encounter (HOSPITAL_COMMUNITY): Payer: Self-pay | Admitting: Gastroenterology

## 2013-09-29 ENCOUNTER — Encounter: Payer: Self-pay | Admitting: Cardiology

## 2013-09-29 DIAGNOSIS — D649 Anemia, unspecified: Secondary | ICD-10-CM | POA: Insufficient documentation

## 2013-09-29 DIAGNOSIS — R001 Bradycardia, unspecified: Secondary | ICD-10-CM | POA: Insufficient documentation

## 2013-09-29 DIAGNOSIS — Q231 Congenital insufficiency of aortic valve: Secondary | ICD-10-CM | POA: Insufficient documentation

## 2013-09-29 DIAGNOSIS — Q2381 Bicuspid aortic valve: Secondary | ICD-10-CM | POA: Insufficient documentation

## 2013-09-29 DIAGNOSIS — E7800 Pure hypercholesterolemia, unspecified: Secondary | ICD-10-CM | POA: Insufficient documentation

## 2013-09-29 DIAGNOSIS — R011 Cardiac murmur, unspecified: Secondary | ICD-10-CM | POA: Insufficient documentation

## 2013-09-29 DIAGNOSIS — M1990 Unspecified osteoarthritis, unspecified site: Secondary | ICD-10-CM | POA: Insufficient documentation

## 2013-09-29 DIAGNOSIS — I252 Old myocardial infarction: Secondary | ICD-10-CM | POA: Insufficient documentation

## 2013-09-29 DIAGNOSIS — N453 Epididymo-orchitis: Secondary | ICD-10-CM | POA: Insufficient documentation

## 2013-09-29 DIAGNOSIS — I2510 Atherosclerotic heart disease of native coronary artery without angina pectoris: Secondary | ICD-10-CM | POA: Insufficient documentation

## 2013-09-29 DIAGNOSIS — I10 Essential (primary) hypertension: Secondary | ICD-10-CM | POA: Insufficient documentation

## 2013-09-29 DIAGNOSIS — N419 Inflammatory disease of prostate, unspecified: Secondary | ICD-10-CM | POA: Insufficient documentation

## 2013-09-29 DIAGNOSIS — R6882 Decreased libido: Secondary | ICD-10-CM | POA: Insufficient documentation

## 2013-09-29 DIAGNOSIS — K219 Gastro-esophageal reflux disease without esophagitis: Secondary | ICD-10-CM | POA: Insufficient documentation

## 2013-09-29 DIAGNOSIS — I7090 Unspecified atherosclerosis: Secondary | ICD-10-CM | POA: Insufficient documentation

## 2013-09-29 DIAGNOSIS — R55 Syncope and collapse: Secondary | ICD-10-CM | POA: Insufficient documentation

## 2013-09-29 DIAGNOSIS — N400 Enlarged prostate without lower urinary tract symptoms: Secondary | ICD-10-CM | POA: Insufficient documentation

## 2013-10-02 ENCOUNTER — Ambulatory Visit (INDEPENDENT_AMBULATORY_CARE_PROVIDER_SITE_OTHER): Payer: Medicare Other | Admitting: Cardiology

## 2013-10-02 ENCOUNTER — Encounter: Payer: Self-pay | Admitting: Cardiology

## 2013-10-02 VITALS — BP 140/70 | HR 66 | Ht 69.0 in | Wt 148.0 lb

## 2013-10-02 DIAGNOSIS — I252 Old myocardial infarction: Secondary | ICD-10-CM

## 2013-10-02 DIAGNOSIS — I10 Essential (primary) hypertension: Secondary | ICD-10-CM

## 2013-10-02 DIAGNOSIS — M069 Rheumatoid arthritis, unspecified: Secondary | ICD-10-CM

## 2013-10-02 DIAGNOSIS — I2510 Atherosclerotic heart disease of native coronary artery without angina pectoris: Secondary | ICD-10-CM

## 2013-10-02 DIAGNOSIS — I471 Supraventricular tachycardia: Secondary | ICD-10-CM

## 2013-10-02 DIAGNOSIS — I7090 Unspecified atherosclerosis: Secondary | ICD-10-CM

## 2013-10-02 DIAGNOSIS — Q231 Congenital insufficiency of aortic valve: Secondary | ICD-10-CM

## 2013-10-02 DIAGNOSIS — I498 Other specified cardiac arrhythmias: Secondary | ICD-10-CM

## 2013-10-02 DIAGNOSIS — R001 Bradycardia, unspecified: Secondary | ICD-10-CM

## 2013-10-02 HISTORY — DX: Rheumatoid arthritis, unspecified: M06.9

## 2013-10-02 NOTE — Patient Instructions (Signed)
Your physician recommends that you continue on your current medications as directed. Please refer to the Current Medication list given to you today.  Your physician wants you to follow-up in: 6 months with Dr. Skains. You will receive a reminder letter in the mail two months in advance. If you don't receive a letter, please call our office to schedule the follow-up appointment.  

## 2013-10-02 NOTE — Progress Notes (Signed)
[address], [address] [place], [place]  [address] Phone: [phone] Fax:  [phone]  Date:  10/02/2013   ID:  Seth Simpson, DOB 03/18/1931, MRN [id number]  PCP:  Pearla Dubonnet, MD   History of Present Illness: Seth Simpson is a 79 y.o. male with coronary artery disease status post bypass surgery in April of 2009, prior myocardial infarction/inferior, history of anemia, left anterior fascicular block, PVCs, minimal aortic stenosis with hyperlipidemia here for followup.  PVCs-occasional dizzy spells with paroxysmal supraventricular tachycardia noted one time during colonoscopy postop. Prior consultation note reviewed. HIV spells, quickly lasting about every seconds and has a feeling going through his face and neck. No syncope. Sometimes associated with caffeine. Valsalva maneuver such as taking a deep breath can help. Tolerating statin well. Wants to discuss Crestor. Stopped for a while to see if this helps with fatigue. No anginal symptoms. Currently having no palpitations.   Eating steel cut oats for breakfast for years. BP higher in the am hours. He has noticed this since taking methotrexate for newly diagnosed with arthritis, followed by Dr. Lendon Colonel as well as metformin.  Wt Readings from Last 3 Encounters:  10/02/13 148 lb (67.132 kg)  04/06/12 150 lb (68.04 kg)  04/06/12 150 lb (68.04 kg)     Past Medical History  Diagnosis Date  . Coronary artery disease   . Myocardial infarction   . Angina   . Hypertension   . Heart murmur   . Shortness of breath   . Anemia   . GERD (gastroesophageal reflux disease)   . Arthritis   . ASVD (arteriosclerotic vascular disease)     , s/p CAGB-presentation acute IMI, medically aborted-Dr. Corliss Marcus  . Hypercholesteremia   . Erectile dysfunction   . Depression   . Decreased libido   . Orchitis and epididymitis   . Prostatitis   . BPH (benign prostatic hyperplasia)   . Bradycardia   . Syncope   .  Supraventricular tachycardia   . Bicuspid aortic valve     Past Surgical History  Procedure Laterality Date  . Cardiac catheterization    . Total knee arthroplasty    . Coronary artery bypass graft Bilateral 2009  . Intraocular lens insertion  2003  . Replacement total knee bilateral  2008  . Colonoscopy  04/06/2012    Procedure: COLONOSCOPY;  Surgeon: Charolett Bumpers, MD;  Location: [facility] ENDOSCOPY;  Service: Endoscopy;  Laterality: N/A;  . Esophagogastroduodenoscopy  04/06/2012    Procedure: ESOPHAGOGASTRODUODENOSCOPY (EGD);  Surgeon: Charolett Bumpers, MD;  Location: [facility];  Service: Endoscopy;  Laterality: N/A;  . Balloon dilation  04/06/2012    Procedure: BALLOON DILATION;  Surgeon: Charolett Bumpers, MD;  Location: [facility]OSCOPY;  Service: Endoscopy;  Laterality: N/A;  . Cholecystectomy    . Appendectomy    . Knee arthroscopy Right   . Cataract extraction, bilateral      , with IOLs  . Finger surgery      Current Outpatient Prescriptions  Medication Sig Dispense Refill  . allopurinol (ZYLOPRIM) 300 MG tablet Take 300 mg by mouth daily.      Marland Kitchen amLODipine-benazepril (LOTREL) 5-20 MG per capsule Take 1 capsule by mouth daily.      Marland Kitchen aspirin 81 MG tablet Take 81 mg by mouth daily.      . colchicine 0.6 MG tablet Take 0.6 mg by mouth 2 (two) times daily.      . ferrous sulfate  325 (65 FE) MG tablet Take 325 mg by mouth 2 (two) times daily.      . Flaxseed, Linseed, (FLAXSEED OIL MAX STR) 1300 MG CAPS Take 1 tablet by mouth daily.      . folic acid (FOLVITE) 1 MG tablet Take 1 mg by mouth daily.      Marland Kitchen geriatric multivitamins-minerals (ELDERTONIC/GEVRABON) ELIX Take 15 mLs by mouth daily.      Marland Kitchen loratadine (CLARITIN) 10 MG tablet Take 10 mg by mouth daily.      . metformin (FORTAMET) 500 MG (OSM) 24 hr tablet Take 500 mg by mouth 2 (two) times daily.      . methotrexate (RHEUMATREX) 2.5 MG tablet Take 2.5 mg by mouth once a week. Caution:Chemotherapy. Protect from light.      .  metoprolol succinate (TOPROL-XL) 25 MG 24 hr tablet Take 25 mg by mouth daily.      . Multiple Vitamin (MULTIVITAMIN WITH MINERALS) TABS tablet Take 1 tablet by mouth daily.      . naproxen (NAPRELAN) 500 MG 24 hr tablet Take 500 mg by mouth daily with breakfast.      . omeprazole (PRILOSEC) 20 MG capsule Take 20 mg by mouth daily.      . sertraline (ZOLOFT) 50 MG tablet Take 50 mg by mouth daily.      . sildenafil (REVATIO) 20 MG tablet Take 10 mg by mouth 3 (three) times daily.      . sildenafil (VIAGRA) 100 MG tablet Take 100 mg by mouth daily as needed for erectile dysfunction.      Marland Kitchen terazosin (HYTRIN) 5 MG capsule Take 5 mg by mouth at bedtime.      . rosuvastatin (CRESTOR) 10 MG tablet Take 10 mg by mouth daily.       No current facility-administered medications for this visit.    Allergies:    Allergies  Allergen Reactions  . Fruit & Vegetable Daily [Nutritional Supplements]     Papaya  . Hydrochlorothiazide Other (See Comments)    Gout  . Other Diarrhea    Splenda  . Peanut-Containing Drug Products     Peanuts/Pistachios...Marland KitchenMarland KitchenMarland Kitchen Anaphylaxis  . Codeine Itching and Rash    Social History:  The patient  reports that he quit smoking about 49 years ago. He does not have any smokeless tobacco history on file. He reports that he drinks about 1.2 ounces of alcohol per week. He reports that he does not use illicit drugs.   ROS:  Please see the history of present illness.  No syncope, no bleeding, no palps. Has been dx with RA. ?Gout   All other systems reviewed and negative.   PHYSICAL EXAM: VS:  BP 140/70  Pulse 66  Ht 5\' 9"  (1.753 m)  Wt 148 lb (67.132 kg)  BMI 21.85 kg/m2 Well nourished, well developed, in no acute distress HEENT: normal Neck: no JVD Cardiac:  normal S1, S2;RRR; 2/6 systolic murmur with radiation to carotids. Lungs:  clear to auscultation bilaterally, no wheezing, rhonchi or rales Abd: soft, nontender, no hepatomegaly Ext: no edema Skin: warm and  dry Neuro: no focal abnormalities noted  EKG:  Sinus rhythm, left anterior fascicular block, nonspecific ST T-wave changes, no significant change from prior EKG. Echocardiogram: Mild aortic stenosis, mean gradient 13 mm mercury 2011     ASSESSMENT AND PLAN:  1. RA-newly diagnosed. Dr. Lendon Colonel. Methotrexate. Monitoring liver function. 2. HTN-higher in the am hours. New since MTX and metformin. Suggested trying to take amlodipine/benazepril  in the evening hours. Monitor blood pressure. 3. Paroxysmal supraventricular tachycardia-currently well controlled. Low-dose metoprolol. No recent issues with this. Once again, this was seen post endoscopy. Valsalva maneuver. 4. Anterior fascicular block-currently monitoring, no syncope. 5. Sinoatrial node dysfunction-prior bradycardia. No significant symptoms currently. 6. Old myocardial infarction-inferior wall 7. Coronary artery disease-prior bypass surgery 2009 8. Mild aortic stenosis-continue to monitor clinically. 9. Hyperlipidemia-I encouraged him to resume his Crestor 10 mg once a day. He went for a trial off of this medication however his fatigue/symptoms did not change. Dr. Kevan Ny supervised this trial. 10. Congenital bicuspid aortic valve  Signed, Donato Schultz, MD [facility]  10/02/2013 9:17 AM

## 2014-03-27 ENCOUNTER — Other Ambulatory Visit: Payer: Self-pay

## 2014-03-27 MED ORDER — METOPROLOL SUCCINATE ER 25 MG PO TB24: 25.0000 mg | ORAL_TABLET | Freq: Every day | ORAL | Status: DC

## 2014-04-04 ENCOUNTER — Encounter: Payer: Self-pay | Admitting: Cardiology

## 2014-04-04 ENCOUNTER — Ambulatory Visit (INDEPENDENT_AMBULATORY_CARE_PROVIDER_SITE_OTHER): Payer: Medicare PPO | Admitting: Cardiology

## 2014-04-04 VITALS — BP 118/78 | HR 65 | Ht 69.0 in | Wt 145.0 lb

## 2014-04-04 DIAGNOSIS — I444 Left anterior fascicular block: Secondary | ICD-10-CM

## 2014-04-04 DIAGNOSIS — I2510 Atherosclerotic heart disease of native coronary artery without angina pectoris: Secondary | ICD-10-CM

## 2014-04-04 DIAGNOSIS — I4460 Unspecified fascicular block: Secondary | ICD-10-CM

## 2014-04-04 DIAGNOSIS — I10 Essential (primary) hypertension: Secondary | ICD-10-CM

## 2014-04-04 DIAGNOSIS — I252 Old myocardial infarction: Secondary | ICD-10-CM

## 2014-04-04 DIAGNOSIS — Q231 Congenital insufficiency of aortic valve: Secondary | ICD-10-CM

## 2014-04-04 NOTE — Patient Instructions (Signed)
Your physician wants you to follow-up in: 6 months You will receive a reminder letter in the mail two months in advance. If you don't receive a letter, please call our office to schedule the follow-up appointment.   Your physician has requested that you have an echocardiogram. Echocardiography is a painless test that uses sound waves to create images of your heart. It provides your doctor with information about the size and shape of your heart and how well your heart's chambers and valves are working. This procedure takes approximately one hour. There are no restrictions for this procedure.  Your physician recommends that you continue on your current medications as directed. Please refer to the Current Medication list given to you today.

## 2014-04-04 NOTE — Progress Notes (Signed)
[name]. [address] [place], [place]  [address] Phone: [phone] Fax:  [phone]  Date:  04/04/2014   ID:  Seth Simpson, DOB 1931-07-09, MRN [id number]  PCP:  Henrine Screws, MD   History of Present Illness: Seth Simpson is a 80 y.o. male with coronary artery disease status post bypass surgery in April of 2009, prior myocardial infarction/inferior, history of anemia, left anterior fascicular block, PVCs, minimal aortic stenosis with possible bicuspid valve with hyperlipidemia here for followup.  PVCs-occasional dizzy spells with paroxysmal supraventricular tachycardia noted one time during colonoscopy postop. Prior consultation note reviewed. HIV spells, quickly lasting about every seconds and has a feeling going through his face and neck. No syncope. Sometimes associated with caffeine. Valsalva maneuver such as taking a deep breath can help.  Tolerating statin well.  Crestor.    Eating steel cut oats for breakfast for years.  Denies any syncope, shortness of breath, chest pain.  Wt Readings from Last 3 Encounters:  04/04/14 145 lb (65.772 kg)  10/02/13 148 lb (67.132 kg)  04/06/12 150 lb (68.04 kg)     Past Medical History  Diagnosis Date  . Coronary artery disease   . Myocardial infarction   . Angina   . Hypertension   . Heart murmur   . Shortness of breath   . Anemia   . GERD (gastroesophageal reflux disease)   . Arthritis   . ASVD (arteriosclerotic vascular disease)     , s/p CAGB-presentation acute IMI, medically aborted-Dr. Rodell Perna  . Hypercholesteremia   . Erectile dysfunction   . Depression   . Decreased libido   . Orchitis and epididymitis   . Prostatitis   . BPH (benign prostatic hyperplasia)   . Bradycardia   . Syncope   . Supraventricular tachycardia   . Bicuspid aortic valve   . Rheumatoid arthritis 10/02/2013    Dr. Lenna Gilford, methotrexate    Past Surgical History  Procedure Laterality Date  . Cardiac  catheterization    . Total knee arthroplasty    . Coronary artery bypass graft Bilateral 2009  . Intraocular lens insertion  2003  . Replacement total knee bilateral  2008  . Colonoscopy  04/06/2012    Procedure: COLONOSCOPY;  Surgeon: Garlan Fair, MD;  Location: [facility];  Service: Endoscopy;  Laterality: N/A;  . Esophagogastroduodenoscopy  04/06/2012    Procedure: ESOPHAGOGASTRODUODENOSCOPY (EGD);  Surgeon: Garlan Fair, MD;  Location: [facility];  Service: Endoscopy;  Laterality: N/A;  . Balloon dilation  04/06/2012    Procedure: BALLOON DILATION;  Surgeon: Garlan Fair, MD;  Location: [facility];  Service: Endoscopy;  Laterality: N/A;  . Cholecystectomy    . Appendectomy    . Knee arthroscopy Right   . Cataract extraction, bilateral      , with IOLs  . Finger surgery      Current Outpatient Prescriptions  Medication Sig Dispense Refill  . allopurinol (ZYLOPRIM) 300 MG tablet Take 300 mg by mouth daily.      Marland Kitchen amLODipine-benazepril (LOTREL) 5-20 MG per capsule Take 1 capsule by mouth daily.      Marland Kitchen aspirin 81 MG tablet Take 81 mg by mouth daily.      . colchicine 0.6 MG tablet Take 0.6 mg by mouth as needed.       . ferrous sulfate 325 (65 FE) MG tablet Take 325 mg by mouth 2 (two) times daily.      Marland Kitchen  Flaxseed, Linseed, (FLAXSEED OIL MAX STR) 1300 MG CAPS Take 1 tablet by mouth daily.      . folic acid (FOLVITE) 1 MG tablet Take 1 mg by mouth daily.      Marland Kitchen geriatric multivitamins-minerals (ELDERTONIC/GEVRABON) ELIX Take 15 mLs by mouth daily.      Marland Kitchen loratadine (CLARITIN) 10 MG tablet Take 10 mg by mouth daily.      . metformin (FORTAMET) 500 MG (OSM) 24 hr tablet Take 500 mg by mouth 2 (two) times daily.      . methotrexate (RHEUMATREX) 2.5 MG tablet Take 2.5 mg by mouth once a week. Caution:Chemotherapy. Protect from light.      . metoprolol succinate (TOPROL-XL) 25 MG 24 hr tablet Take 1 tablet (25 mg total) by mouth daily.  30 tablet  6  . Multiple Vitamin  (MULTIVITAMIN WITH MINERALS) TABS tablet Take 1 tablet by mouth daily.      . naproxen (NAPRELAN) 500 MG 24 hr tablet Take 500 mg by mouth as needed.       Marland Kitchen omeprazole (PRILOSEC) 20 MG capsule Take 20 mg by mouth daily.      . rosuvastatin (CRESTOR) 10 MG tablet Take 10 mg by mouth daily.      . sertraline (ZOLOFT) 50 MG tablet Take 50 mg by mouth daily.      . sildenafil (REVATIO) 20 MG tablet Take 10 mg by mouth 3 (three) times daily.      . sildenafil (VIAGRA) 100 MG tablet Take 100 mg by mouth daily as needed for erectile dysfunction.      Marland Kitchen terazosin (HYTRIN) 5 MG capsule Take 5 mg by mouth at bedtime.       No current facility-administered medications for this visit.    Allergies:    Allergies  Allergen Reactions  . Fruit & Vegetable Daily [Nutritional Supplements]     Papaya  . Hydrochlorothiazide Other (See Comments)    Gout  . Other Diarrhea    Splenda  . Peanut-Containing Drug Products     Peanuts/Pistachios...Marland KitchenMarland KitchenMarland Kitchen Anaphylaxis  . Codeine Itching and Rash    Social History:  The patient  reports that he quit smoking about 49 years ago. He does not have any smokeless tobacco history on file. He reports that he drinks about 1.2 ounces of alcohol per week. He reports that he does not use illicit drugs.   ROS:  Please see the history of present illness.  No syncope, no bleeding, no palps. Has been dx with RA. ?Gout      PHYSICAL EXAM: VS:  BP 118/78  Pulse 65  Ht 5\' 9"  (1.753 m)  Wt 145 lb (65.772 kg)  BMI 21.40 kg/m2 Well nourished, well developed, in no acute distress HEENT: normal Neck: no JVD Cardiac:  normal S1, H8;NID; 2/6 systolic murmur with radiation to carotids. Rare ectopy. Lungs:  clear to auscultation bilaterally, no wheezing, rhonchi or rales Abd: soft, nontender, no hepatomegaly Ext: no edema Skin: warm and dry Neuro: no focal abnormalities noted  EKG:  Sinus rhythm, left anterior fascicular block, nonspecific ST T-wave changes, no significant change  from prior EKG. Echocardiogram 2013: Mild aortic stenosis, mean gradient 13 mm mercury      ASSESSMENT AND PLAN:  1. RA-newly diagnosed. Dr. Lenna Gilford. Methotrexate. Monitoring liver function. 2. HTN-was higher in the am hours. He has not seen as recently. 3. Paroxysmal supraventricular tachycardia-currently well controlled. Low-dose metoprolol. No recent issues with this. Once again, this was seen post endoscopy. Valsalva  maneuver. 4. Anterior fascicular block-currently monitoring, no syncope. 5. Sinoatrial node dysfunction-prior bradycardia. No significant symptoms currently. 6. Old myocardial infarction-inferior wall 7. Coronary artery disease-prior bypass surgery 2009 8. Mild aortic stenosis-continue to monitor, check ECHO 9. Hyperlipidemia-Crestor 10. Congenital bicuspid aortic valve  Signed, Candee Furbish, MD [facility]  04/04/2014 5:26 PM

## 2014-04-23 ENCOUNTER — Ambulatory Visit (HOSPITAL_COMMUNITY): Payer: Medicare PPO | Attending: Cardiology | Admitting: Radiology

## 2014-04-23 DIAGNOSIS — I359 Nonrheumatic aortic valve disorder, unspecified: Secondary | ICD-10-CM

## 2014-04-23 DIAGNOSIS — Q231 Congenital insufficiency of aortic valve: Secondary | ICD-10-CM

## 2014-04-23 NOTE — Progress Notes (Signed)
Echocardiogram performed.  

## 2014-04-26 ENCOUNTER — Telehealth: Payer: Self-pay | Admitting: Cardiology

## 2014-04-26 NOTE — Telephone Encounter (Signed)
Notified of echo results.

## 2014-04-26 NOTE — Telephone Encounter (Signed)
New message ° ° ° ° ° °Want echo results °

## 2014-05-02 NOTE — Progress Notes (Signed)
LVM for patient to call the office

## 2014-05-10 ENCOUNTER — Other Ambulatory Visit: Payer: Self-pay

## 2014-05-10 MED ORDER — METOPROLOL SUCCINATE ER 25 MG PO TB24: 25.0000 mg | ORAL_TABLET | Freq: Every day | ORAL | Status: DC

## 2014-10-22 ENCOUNTER — Encounter: Payer: Self-pay | Admitting: Cardiology

## 2014-10-22 ENCOUNTER — Ambulatory Visit (INDEPENDENT_AMBULATORY_CARE_PROVIDER_SITE_OTHER): Payer: Medicare PPO | Admitting: Cardiology

## 2014-10-22 VITALS — BP 102/60 | HR 58 | Ht 70.0 in | Wt 144.0 lb

## 2014-10-22 DIAGNOSIS — Q231 Congenital insufficiency of aortic valve: Secondary | ICD-10-CM

## 2014-10-22 DIAGNOSIS — I2510 Atherosclerotic heart disease of native coronary artery without angina pectoris: Secondary | ICD-10-CM

## 2014-10-22 DIAGNOSIS — I350 Nonrheumatic aortic (valve) stenosis: Secondary | ICD-10-CM

## 2014-10-22 DIAGNOSIS — I471 Supraventricular tachycardia: Secondary | ICD-10-CM

## 2014-10-22 DIAGNOSIS — I493 Ventricular premature depolarization: Secondary | ICD-10-CM

## 2014-10-22 DIAGNOSIS — I2583 Coronary atherosclerosis due to lipid rich plaque: Principal | ICD-10-CM

## 2014-10-22 DIAGNOSIS — I498 Other specified cardiac arrhythmias: Secondary | ICD-10-CM

## 2014-10-22 DIAGNOSIS — I499 Cardiac arrhythmia, unspecified: Secondary | ICD-10-CM

## 2014-10-22 DIAGNOSIS — I252 Old myocardial infarction: Secondary | ICD-10-CM

## 2014-10-22 NOTE — Patient Instructions (Signed)
The current medical regimen is effective;  continue present plan and medications.  Follow up in 6 months with Dr. Skains.  You will receive a letter in the mail 2 months before you are due.  Please call us when you receive this letter to schedule your follow up appointment.  

## 2014-10-22 NOTE — Progress Notes (Signed)
[facility]. [address] [place], [place]  [address] Phone: [phone] Fax:  [phone]  Date:  10/22/2014   ID:  Seth Simpson, DOB July 05, 1931, MRN [id number]  PCP:  Henrine Screws, MD   History of Present Illness: Seth Simpson is a 80 y.o. male with coronary artery disease status post bypass surgery in April of 2009, prior myocardial infarction/inferior, history of anemia, left anterior fascicular block, PVCs, minimal aortic stenosis with possible bicuspid valve with hyperlipidemia here for followup.  PVCs-occasional dizzy spells with paroxysmal supraventricular tachycardia noted one time during colonoscopy postop. Prior consultation note reviewed. dizzy spells, quickly lasting about every seconds and has a feeling going through his face and neck. No syncope. Sometimes associated with caffeine. Valsalva maneuver such as taking a deep breath can help. He really feels the sensation.  Tolerating statin well.  Crestor.    Eating steel cut oats for breakfast for years.  Denies any syncope, shortness of breath, chest pain.  Echocardiogram 04/23/14-normal ejection fraction, possible bicuspid aortic valve, mild aortic stenosis mean gradient 12 mmHg.   Enjoys yardwork, [employer]. Feels good. Rheumatoid arthritis under good control.  Wt Readings from Last 3 Encounters:  10/22/14 144 lb (65.318 kg)  04/04/14 145 lb (65.772 kg)  10/02/13 148 lb (67.132 kg)     Past Medical History  Diagnosis Date  . Coronary artery disease   . Myocardial infarction   . Angina   . Hypertension   . Heart murmur   . Shortness of breath   . Anemia   . GERD (gastroesophageal reflux disease)   . Arthritis   . ASVD (arteriosclerotic vascular disease)     , s/p CAGB-presentation acute IMI, medically aborted-Dr. Rodell Perna  . Hypercholesteremia   . Erectile dysfunction   . Depression   . Decreased libido   . Orchitis and epididymitis   . Prostatitis   . BPH (benign prostatic  hyperplasia)   . Bradycardia   . Syncope   . Supraventricular tachycardia   . Bicuspid aortic valve   . Rheumatoid arthritis 10/02/2013    Dr. Lenna Gilford, methotrexate    Past Surgical History  Procedure Laterality Date  . Cardiac catheterization    . Total knee arthroplasty    . Coronary artery bypass graft Bilateral 2009  . Intraocular lens insertion  2003  . Replacement total knee bilateral  2008  . Colonoscopy  04/06/2012    Procedure: COLONOSCOPY;  Surgeon: Garlan Fair, MD;  Location: [facility];  Service: Endoscopy;  Laterality: N/A;  . Esophagogastroduodenoscopy  04/06/2012    Procedure: ESOPHAGOGASTRODUODENOSCOPY (EGD);  Surgeon: Garlan Fair, MD;  Location: [facility];  Service: Endoscopy;  Laterality: N/A;  . Balloon dilation  04/06/2012    Procedure: BALLOON DILATION;  Surgeon: Garlan Fair, MD;  Location: [facility];  Service: Endoscopy;  Laterality: N/A;  . Cholecystectomy    . Appendectomy    . Knee arthroscopy Right   . Cataract extraction, bilateral      , with IOLs  . Finger surgery      Current Outpatient Prescriptions  Medication Sig Dispense Refill  . allopurinol (ZYLOPRIM) 300 MG tablet Take 300 mg by mouth daily.    Marland Kitchen amLODipine-benazepril (LOTREL) 5-20 MG per capsule Take 1 capsule by mouth daily.    Marland Kitchen aspirin 81 MG tablet Take 81 mg by mouth daily.    . colchicine 0.6 MG tablet Take 0.6 mg by mouth as needed.     Marland Kitchen  ferrous sulfate 325 (65 FE) MG tablet Take 325 mg by mouth 2 (two) times daily.    . Flaxseed, Linseed, (FLAXSEED OIL MAX STR) 1300 MG CAPS Take 1 tablet by mouth daily.    . folic acid (FOLVITE) 1 MG tablet Take 1 mg by mouth daily.    Marland Kitchen geriatric multivitamins-minerals (ELDERTONIC/GEVRABON) ELIX Take 15 mLs by mouth daily.    Marland Kitchen loratadine (CLARITIN) 10 MG tablet Take 10 mg by mouth daily.    . metformin (FORTAMET) 500 MG (OSM) 24 hr tablet Take 500 mg by mouth 2 (two) times daily.    . methotrexate (RHEUMATREX) 2.5 MG tablet  Take 2.5 mg by mouth once a week. Caution:Chemotherapy. Protect from light.    . metoprolol succinate (TOPROL-XL) 25 MG 24 hr tablet Take 1 tablet (25 mg total) by mouth daily. 30 tablet 6  . Multiple Vitamin (MULTIVITAMIN WITH MINERALS) TABS tablet Take 1 tablet by mouth daily.    . naproxen (NAPRELAN) 500 MG 24 hr tablet Take 500 mg by mouth as needed.     Marland Kitchen omeprazole (PRILOSEC) 20 MG capsule Take 20 mg by mouth daily.    . rosuvastatin (CRESTOR) 10 MG tablet Take 10 mg by mouth daily.    . sertraline (ZOLOFT) 50 MG tablet Take 50 mg by mouth daily.    . sildenafil (VIAGRA) 100 MG tablet Take 100 mg by mouth daily as needed for erectile dysfunction.    Marland Kitchen terazosin (HYTRIN) 5 MG capsule Take 5 mg by mouth at bedtime.     No current facility-administered medications for this visit.    Allergies:    Allergies  Allergen Reactions  . Fruit & Vegetable Daily [Nutritional Supplements]     Papaya  . Hydrochlorothiazide Other (See Comments)    Gout  . Other Diarrhea    Splenda  . Peanut-Containing Drug Products     Peanuts/Pistachios...Marland KitchenMarland KitchenMarland Kitchen Anaphylaxis  . Codeine Itching and Rash    Social History:  The patient  reports that he quit smoking about 50 years ago. He does not have any smokeless tobacco history on file. He reports that he drinks about 1.2 oz of alcohol per week. He reports that he does not use illicit drugs.   ROS:  Please see the history of present illness.  No syncope, no bleeding, no palps. Has been dx with RA. ?Gout      PHYSICAL EXAM: VS:  BP 102/60 mmHg  Pulse 58  Ht 5\' 10"  (1.778 m)  Wt 144 lb (65.318 kg)  BMI 20.66 kg/m2 Well nourished, well developed, in no acute distress HEENT: normal Neck: no JVD Cardiac:  normal S1, J1;HER; 2/6 systolic murmur with radiation to carotids. Occsaional ectopy. Lungs:  clear to auscultation bilaterally, no wheezing, rhonchi or rales Abd: soft, nontender, no hepatomegaly Ext: no edema Skin: warm and dry Neuro: no focal  abnormalities noted  EKG:  10/22/14-sinus bradycardia rate 58 with frequent PVCs in bigeminy pattern, left axis deviation. Septal infarct pattern.-Prior Sinus rhythm, left anterior fascicular block, nonspecific ST T-wave changes, no significant change from prior EKG. Echocardiogram 2013: Mild aortic stenosis, mean gradient 13 mm mercury      ASSESSMENT AND PLAN:  1. RA-newly diagnosed. Dr. Lenna Gilford. Methotrexate. Monitoring liver function. He is doing well. 2. HTN-was higher in the am hours. Currently under great control. He is not dizzy. 3. Paroxysmal supraventricular tachycardia-currently well controlled. Low-dose metoprolol. No recent issues with this. Once again, this was seen post endoscopy. Valsalva maneuver.  4. Anterior fascicular  block-currently monitoring, no syncope. 5. Sinoatrial node dysfunction-prior bradycardia. No significant symptoms currently. 6. Frequent PVCs-bigeminy pattern seen. Low-dose metoprolol. 7. Old myocardial infarction-inferior wall 8. Coronary artery disease-prior bypass surgery 2009-secondary prevention 9. Mild aortic stenosis-continue to monitor, echocardiogram 2015 with mild aortic stenosis. Murmur appreciated. 10. Hyperlipidemia-Crestor. LDL GOAL 70. Dr. Inda Merlin has been monitoring. 11. Possible Congenital bicuspid aortic valve  Signed, Candee Furbish, MD [facility]  10/22/2014 8:35 AM

## 2014-11-20 ENCOUNTER — Inpatient Hospital Stay (HOSPITAL_COMMUNITY)
Admission: EM | Admit: 2014-11-20 | Discharge: 2014-11-21 | DRG: 309 | Disposition: A | Discharge status: Home / Self Care | Payer: Medicare PPO | Attending: Cardiology | Admitting: Cardiology

## 2014-11-20 ENCOUNTER — Emergency Department (HOSPITAL_COMMUNITY): Payer: Medicare PPO

## 2014-11-20 ENCOUNTER — Encounter (HOSPITAL_COMMUNITY): Payer: Self-pay | Admitting: Family Medicine

## 2014-11-20 DIAGNOSIS — I350 Nonrheumatic aortic (valve) stenosis: Secondary | ICD-10-CM | POA: Diagnosis present

## 2014-11-20 DIAGNOSIS — Z87891 Personal history of nicotine dependence: Secondary | ICD-10-CM

## 2014-11-20 DIAGNOSIS — E785 Hyperlipidemia, unspecified: Secondary | ICD-10-CM | POA: Diagnosis present

## 2014-11-20 DIAGNOSIS — Z7982 Long term (current) use of aspirin: Secondary | ICD-10-CM | POA: Diagnosis not present

## 2014-11-20 DIAGNOSIS — F1010 Alcohol abuse, uncomplicated: Secondary | ICD-10-CM | POA: Diagnosis present

## 2014-11-20 DIAGNOSIS — I10 Essential (primary) hypertension: Secondary | ICD-10-CM | POA: Diagnosis present

## 2014-11-20 DIAGNOSIS — I493 Ventricular premature depolarization: Secondary | ICD-10-CM | POA: Diagnosis present

## 2014-11-20 DIAGNOSIS — I471 Supraventricular tachycardia, unspecified: Secondary | ICD-10-CM | POA: Diagnosis present

## 2014-11-20 DIAGNOSIS — E780 Pure hypercholesterolemia: Secondary | ICD-10-CM | POA: Diagnosis present

## 2014-11-20 DIAGNOSIS — R002 Palpitations: Secondary | ICD-10-CM

## 2014-11-20 DIAGNOSIS — I252 Old myocardial infarction: Secondary | ICD-10-CM

## 2014-11-20 DIAGNOSIS — D539 Nutritional anemia, unspecified: Secondary | ICD-10-CM | POA: Diagnosis present

## 2014-11-20 DIAGNOSIS — F329 Major depressive disorder, single episode, unspecified: Secondary | ICD-10-CM | POA: Diagnosis present

## 2014-11-20 DIAGNOSIS — M1990 Unspecified osteoarthritis, unspecified site: Secondary | ICD-10-CM | POA: Diagnosis present

## 2014-11-20 DIAGNOSIS — Z888 Allergy status to other drugs, medicaments and biological substances status: Secondary | ICD-10-CM | POA: Diagnosis not present

## 2014-11-20 DIAGNOSIS — I2510 Atherosclerotic heart disease of native coronary artery without angina pectoris: Secondary | ICD-10-CM | POA: Diagnosis present

## 2014-11-20 DIAGNOSIS — Q231 Congenital insufficiency of aortic valve: Secondary | ICD-10-CM

## 2014-11-20 DIAGNOSIS — Z951 Presence of aortocoronary bypass graft: Secondary | ICD-10-CM

## 2014-11-20 DIAGNOSIS — N400 Enlarged prostate without lower urinary tract symptoms: Secondary | ICD-10-CM | POA: Diagnosis present

## 2014-11-20 DIAGNOSIS — Z885 Allergy status to narcotic agent status: Secondary | ICD-10-CM | POA: Diagnosis not present

## 2014-11-20 DIAGNOSIS — K219 Gastro-esophageal reflux disease without esophagitis: Secondary | ICD-10-CM | POA: Diagnosis present

## 2014-11-20 DIAGNOSIS — M069 Rheumatoid arthritis, unspecified: Secondary | ICD-10-CM | POA: Diagnosis present

## 2014-11-20 HISTORY — DX: Ventricular premature depolarization: I49.3

## 2014-11-20 HISTORY — DX: Type 2 diabetes mellitus without complications: E11.9

## 2014-11-20 LAB — COMPREHENSIVE METABOLIC PANEL
ALBUMIN: 3.7 g/dL (ref 3.5–5.2)
ALK PHOS: 106 U/L (ref 39–117)
ALT: 13 U/L (ref 0–53)
ANION GAP: 15 (ref 5–15)
AST: 18 U/L (ref 0–37)
BILIRUBIN TOTAL: 0.6 mg/dL (ref 0.3–1.2)
BUN: 28 mg/dL — AB (ref 6–23)
CHLORIDE: 110 meq/L (ref 96–112)
CO2: 18 mEq/L — ABNORMAL LOW (ref 19–32)
CREATININE: 1.05 mg/dL (ref 0.50–1.35)
Calcium: 9.9 mg/dL (ref 8.4–10.5)
GFR calc non Af Amer: 64 mL/min — ABNORMAL LOW (ref >90)
GFR, EST AFRICAN AMERICAN: 75 mL/min — AB (ref >90)
Glucose, Bld: 139 mg/dL — ABNORMAL HIGH (ref 70–99)
Potassium: 4.2 mEq/L (ref 3.7–5.3)
Sodium: 143 mEq/L (ref 137–147)
TOTAL PROTEIN: 7.2 g/dL (ref 6.0–8.3)

## 2014-11-20 LAB — CBC WITH DIFFERENTIAL/PLATELET
BASOS PCT: 0 % (ref 0–1)
Basophils Absolute: 0 10*3/uL (ref 0.0–0.1)
EOS ABS: 0.1 10*3/uL (ref 0.0–0.7)
Eosinophils Relative: 1 % (ref 0–5)
HEMATOCRIT: 38 % — AB (ref 39.0–52.0)
Hemoglobin: 12.4 g/dL — ABNORMAL LOW (ref 13.0–17.0)
LYMPHS ABS: 1.1 10*3/uL (ref 0.7–4.0)
Lymphocytes Relative: 13 % (ref 12–46)
MCH: 34.7 pg — AB (ref 26.0–34.0)
MCHC: 32.6 g/dL (ref 30.0–36.0)
MCV: 106.4 fL — ABNORMAL HIGH (ref 78.0–100.0)
MONO ABS: 0.8 10*3/uL (ref 0.1–1.0)
Monocytes Relative: 9 % (ref 3–12)
Neutro Abs: 6.4 10*3/uL (ref 1.7–7.7)
Neutrophils Relative %: 77 % (ref 43–77)
Platelets: 207 10*3/uL (ref 150–400)
RBC: 3.57 MIL/uL — ABNORMAL LOW (ref 4.22–5.81)
RDW: 13.7 % (ref 11.5–15.5)
WBC: 8.4 10*3/uL (ref 4.0–10.5)

## 2014-11-20 LAB — GLUCOSE, CAPILLARY
GLUCOSE-CAPILLARY: 180 mg/dL — AB (ref 70–99)
Glucose-Capillary: 134 mg/dL — ABNORMAL HIGH (ref 70–99)

## 2014-11-20 LAB — PROTIME-INR
INR: 1.18 (ref 0.00–1.49)
Prothrombin Time: 15.1 seconds (ref 11.6–15.2)

## 2014-11-20 LAB — TSH: TSH: 2.74 u[IU]/mL (ref 0.350–4.500)

## 2014-11-20 LAB — MRSA PCR SCREENING: MRSA by PCR: NEGATIVE

## 2014-11-20 MED ORDER — FOLIC ACID 1 MG PO TABS
1.0000 mg | ORAL_TABLET | Freq: Every morning | ORAL | Status: DC
  Administered 2014-11-21: 1 mg via ORAL
  Filled 2014-11-20: qty 1

## 2014-11-20 MED ORDER — ONDANSETRON HCL 4 MG/2ML IJ SOLN
4.0000 mg | Freq: Four times a day (QID) | INTRAMUSCULAR | Status: DC | PRN
Start: 2014-11-20 — End: 2014-11-21

## 2014-11-20 MED ORDER — ALLOPURINOL 300 MG PO TABS
300.0000 mg | ORAL_TABLET | Freq: Every evening | ORAL | Status: DC
  Administered 2014-11-20: 300 mg via ORAL
  Filled 2014-11-20: qty 1

## 2014-11-20 MED ORDER — NITROGLYCERIN 0.4 MG SL SUBL: 0.4000 mg | SUBLINGUAL_TABLET | SUBLINGUAL | Status: DC | PRN

## 2014-11-20 MED ORDER — ACETAMINOPHEN 325 MG PO TABS: 650.0000 mg | ORAL_TABLET | ORAL | Status: DC | PRN

## 2014-11-20 MED ORDER — TERAZOSIN HCL 5 MG PO CAPS
5.0000 mg | ORAL_CAPSULE | Freq: Every day | ORAL | Status: DC
  Administered 2014-11-20: 5 mg via ORAL
  Filled 2014-11-20 (×2): qty 1

## 2014-11-20 MED ORDER — LORATADINE 10 MG PO TABS
10.0000 mg | ORAL_TABLET | Freq: Every morning | ORAL | Status: DC
  Administered 2014-11-21: 10 mg via ORAL
  Filled 2014-11-20: qty 1

## 2014-11-20 MED ORDER — ROSUVASTATIN CALCIUM 10 MG PO TABS
10.0000 mg | ORAL_TABLET | Freq: Every morning | ORAL | Status: DC
  Administered 2014-11-21: 10 mg via ORAL
  Filled 2014-11-20: qty 1

## 2014-11-20 MED ORDER — ADENOSINE 6 MG/2ML IV SOLN
6.0000 mg | Freq: Once | INTRAVENOUS | Status: AC
  Administered 2014-11-20: 6 mg via INTRAVENOUS

## 2014-11-20 MED ORDER — DILTIAZEM LOAD VIA INFUSION
10.0000 mg | Freq: Once | INTRAVENOUS | Status: AC
  Administered 2014-11-20: 10 mg via INTRAVENOUS
  Filled 2014-11-20: qty 10

## 2014-11-20 MED ORDER — SODIUM CHLORIDE 0.9 % IJ SOLN
3.0000 mL | Freq: Two times a day (BID) | INTRAMUSCULAR | Status: DC
  Administered 2014-11-20: 3 mL via INTRAVENOUS

## 2014-11-20 MED ORDER — DILTIAZEM LOAD VIA INFUSION
10.0000 mg | Freq: Once | INTRAVENOUS | Status: AC
  Administered 2014-11-20: 10 mg via INTRAVENOUS

## 2014-11-20 MED ORDER — SERTRALINE HCL 50 MG PO TABS
50.0000 mg | ORAL_TABLET | Freq: Every morning | ORAL | Status: DC
  Administered 2014-11-21: 50 mg via ORAL
  Filled 2014-11-20: qty 1

## 2014-11-20 MED ORDER — AMIODARONE HCL IN DEXTROSE 360-4.14 MG/200ML-% IV SOLN
60.0000 mg/h | INTRAVENOUS | Status: AC
  Administered 2014-11-20: 60 mg/h via INTRAVENOUS
  Filled 2014-11-20: qty 200

## 2014-11-20 MED ORDER — ASPIRIN EC 81 MG PO TBEC
81.0000 mg | DELAYED_RELEASE_TABLET | Freq: Every day | ORAL | Status: DC
  Administered 2014-11-21: 81 mg via ORAL
  Filled 2014-11-20: qty 1

## 2014-11-20 MED ORDER — ADENOSINE 6 MG/2ML IV SOLN
INTRAVENOUS | Status: AC
  Administered 2014-11-20: 6 mg via INTRAVENOUS
  Filled 2014-11-20: qty 6

## 2014-11-20 MED ORDER — METOPROLOL SUCCINATE ER 25 MG PO TB24
25.0000 mg | ORAL_TABLET | Freq: Every day | ORAL | Status: DC
  Administered 2014-11-21: 25 mg via ORAL
  Filled 2014-11-20: qty 1

## 2014-11-20 MED ORDER — HEPARIN SODIUM (PORCINE) 5000 UNIT/ML IJ SOLN
5000.0000 [IU] | Freq: Three times a day (TID) | INTRAMUSCULAR | Status: DC
  Administered 2014-11-20 – 2014-11-21 (×2): 5000 [IU] via SUBCUTANEOUS
  Filled 2014-11-20 (×2): qty 1

## 2014-11-20 MED ORDER — ASPIRIN 81 MG PO TABS: 81.0000 mg | ORAL_TABLET | Freq: Every morning | ORAL | Status: DC

## 2014-11-20 MED ORDER — AMIODARONE HCL IN DEXTROSE 360-4.14 MG/200ML-% IV SOLN
INTRAVENOUS | Status: AC
  Administered 2014-11-20: 60 mg/h via INTRAVENOUS
  Filled 2014-11-20: qty 200

## 2014-11-20 MED ORDER — AMIODARONE LOAD VIA INFUSION
150.0000 mg | Freq: Once | INTRAVENOUS | Status: AC
  Administered 2014-11-20: 150 mg via INTRAVENOUS
  Filled 2014-11-20 (×2): qty 83.34

## 2014-11-20 MED ORDER — COLCHICINE 0.6 MG PO TABS: 0.6000 mg | ORAL_TABLET | Freq: Two times a day (BID) | ORAL | Status: DC | PRN

## 2014-11-20 MED ORDER — INSULIN ASPART 100 UNIT/ML ~~LOC~~ SOLN
0.0000 [IU] | Freq: Every day | SUBCUTANEOUS | Status: DC
Start: 2014-11-20 — End: 2014-11-21

## 2014-11-20 MED ORDER — AMLODIPINE BESYLATE 5 MG PO TABS: 5.0000 mg | ORAL_TABLET | Freq: Every day | ORAL | Status: DC

## 2014-11-20 MED ORDER — ELDERTONIC PO ELIX: 15.0000 mL | ORAL_SOLUTION | Freq: Every morning | ORAL | Status: DC

## 2014-11-20 MED ORDER — PANTOPRAZOLE SODIUM 40 MG PO TBEC
40.0000 mg | DELAYED_RELEASE_TABLET | Freq: Every day | ORAL | Status: DC
  Administered 2014-11-21: 40 mg via ORAL
  Filled 2014-11-20: qty 1

## 2014-11-20 MED ORDER — BENAZEPRIL HCL 20 MG PO TABS
20.0000 mg | ORAL_TABLET | Freq: Every day | ORAL | Status: DC
  Filled 2014-11-20 (×2): qty 1

## 2014-11-20 MED ORDER — TRIAMCINOLONE ACETONIDE 55 MCG/ACT NA AERO
2.0000 | INHALATION_SPRAY | Freq: Every day | NASAL | Status: DC | PRN
  Filled 2014-11-20: qty 21.6

## 2014-11-20 MED ORDER — FERROUS SULFATE 325 (65 FE) MG PO TABS
325.0000 mg | ORAL_TABLET | Freq: Two times a day (BID) | ORAL | Status: DC
  Administered 2014-11-20 – 2014-11-21 (×2): 325 mg via ORAL
  Filled 2014-11-20 (×2): qty 1

## 2014-11-20 MED ORDER — AMLODIPINE BESY-BENAZEPRIL HCL 5-20 MG PO CAPS: 1.0000 | ORAL_CAPSULE | Freq: Every evening | ORAL | Status: DC

## 2014-11-20 MED ORDER — SODIUM CHLORIDE 0.9 % IV SOLN: 250.0000 mL | INTRAVENOUS | Status: DC | PRN

## 2014-11-20 MED ORDER — AMIODARONE HCL IN DEXTROSE 360-4.14 MG/200ML-% IV SOLN
30.0000 mg/h | INTRAVENOUS | Status: DC
  Administered 2014-11-21: 30 mg/h via INTRAVENOUS
  Filled 2014-11-20: qty 200

## 2014-11-20 MED ORDER — DIPHENHYDRAMINE HCL 25 MG PO TABS
25.0000 mg | ORAL_TABLET | Freq: Every day | ORAL | Status: DC | PRN
  Filled 2014-11-20: qty 1

## 2014-11-20 MED ORDER — DILTIAZEM HCL 100 MG IV SOLR
5.0000 mg/h | Freq: Once | INTRAVENOUS | Status: AC
  Administered 2014-11-20: 5 mg/h via INTRAVENOUS

## 2014-11-20 MED ORDER — SODIUM CHLORIDE 0.9 % IJ SOLN: 3.0000 mL | INTRAMUSCULAR | Status: DC | PRN

## 2014-11-20 NOTE — ED Notes (Signed)
ED Secretary repaged Dr. Jens Som regarding medications.

## 2014-11-20 NOTE — ED Notes (Signed)
Dr Kline at bedside.

## 2014-11-20 NOTE — Progress Notes (Signed)
Notified Almyra Deforest PA that pt is going in and out of SR/ST/SVT. Pt is not symptomatic. Vitals stable. Continue to monitor. Carroll Kinds RN

## 2014-11-20 NOTE — ED Notes (Signed)
X-ray at bedside

## 2014-11-20 NOTE — ED Notes (Signed)
Dr. Rogene Houston at bedside. Family at bedside and updated.

## 2014-11-20 NOTE — ED Notes (Addendum)
Patient noted to be back in SVT with a rate of 152 at this time.

## 2014-11-20 NOTE — H&P (Signed)
ELECTROPHYSIOLOGY ADMISSION NOTE  Patient ID: Seth Simpson, MRN: [id number], DOB/AGE: Oct 05, 1931 80 y.o. Admit date: 11/20/2014 Date of Consult: 11/20/2014  Primary Physician: Henrine Screws, MD Primary Cardiologist: *MS  Chief Complaint:     HPI ERMIAS TOMEO is a 80 y.o. male  [place] with [place]   He had shown up at PCPs office and found to be in [place] with CSM only to recur  He received Adenosine with termination and immediate resumption.  Then treated with dilt, and has had recurrent return to sinus with rapid return to tach.  The QRS morphology is identical to sinus with freq PVCs  Has had a history of recurrent SVT associated with presyncope and terminating mostly with Valsalva. He has had no syncope. He has been on low-dose beta blockers as well as combination therapy for hypertension including amlodipine and ACE inhibitor as   Cardiac hx notable for remote MI with subsequent CABG in 2009. His a history of mild aortic stenosis and prior PVCs ECG at baseline 11/15 demonstrated sinus bradycardia with left axis deviation and frequent PVCs in a pattern of bigeminy  echocardiogram 5/15 demonstrated normal LV function  He has mild AS    Past Medical History  Diagnosis Date  . Coronary artery disease   . Myocardial infarction   . Angina   . Hypertension   . Heart murmur   . Shortness of breath   . Anemia   . GERD (gastroesophageal reflux disease)   . Arthritis   . ASVD (arteriosclerotic vascular disease)     , s/p CAGB-presentation acute IMI, medically aborted-Dr. Rodell Perna  . Hypercholesteremia   . Erectile dysfunction   . Depression   . Decreased libido   . Orchitis and epididymitis   . Prostatitis   . BPH (benign prostatic hyperplasia)   . Bradycardia   . Syncope   . Supraventricular tachycardia   . Bicuspid aortic valve   . Rheumatoid arthritis 10/02/2013    Dr. Lenna Gilford, methotrexate      Surgical History:    Past Surgical History  Procedure Laterality Date  . Cardiac catheterization    . Total knee arthroplasty    . Coronary artery bypass graft Bilateral 2009  . Intraocular lens insertion  2003  . Replacement total knee bilateral  2008  . Colonoscopy  04/06/2012    Procedure: COLONOSCOPY;  Surgeon: Garlan Fair, MD;  Location: [facility];  Service: Endoscopy;  Laterality: N/A;  . Esophagogastroduodenoscopy  04/06/2012    Procedure: ESOPHAGOGASTRODUODENOSCOPY (EGD);  Surgeon: Garlan Fair, MD;  Location: [facility];  Service: Endoscopy;  Laterality: N/A;  . Balloon dilation  04/06/2012    Procedure: BALLOON DILATION;  Surgeon: Garlan Fair, MD;  Location: [facility];  Service: Endoscopy;  Laterality: N/A;  . Cholecystectomy    . Appendectomy    . Knee arthroscopy Right   . Cataract extraction, bilateral      , with IOLs  . Finger surgery       Home Meds: Prior to Admission medications   Medication Sig Start Date End Date Taking? Authorizing Provider  allopurinol (ZYLOPRIM) 300 MG tablet Take 300 mg by mouth every evening.    Yes Historical Provider, MD  amLODipine-benazepril (LOTREL) 5-20 MG per capsule Take 1 capsule by mouth every evening.    Yes Historical Provider, MD  aspirin 81 MG tablet Take 81 mg by mouth every morning.    Yes Historical Provider, MD  colchicine 0.6 MG tablet Take 0.6 mg by mouth as needed.    Yes Historical Provider, MD  diphenhydrAMINE (BENADRYL) 25 MG tablet Take 25 mg by mouth daily as needed for allergies.   Yes Historical Provider, MD  Diphenhydramine Cit-Aspirin 38.3-500 MG TABS Take 1 tablet by mouth daily as needed (Headache).  11/12/14  Yes Historical Provider, MD  ferrous sulfate 325 (65 FE) MG tablet Take 325 mg by mouth 2 (two) times daily.   Yes Historical Provider, MD  Flaxseed, Linseed, (FLAXSEED OIL MAX STR) 1300 MG CAPS Take 1 tablet by mouth every morning.    Yes Historical Provider, MD  folic acid (FOLVITE) 1 MG tablet Take 1 mg  by mouth every morning.    Yes Historical Provider, MD  geriatric multivitamins-minerals (ELDERTONIC/GEVRABON) ELIX Take 15 mLs by mouth every morning.    Yes Historical Provider, MD  loratadine (CLARITIN) 10 MG tablet Take 10 mg by mouth every morning.    Yes Historical Provider, MD  metFORMIN (GLUCOPHAGE) 500 MG tablet Take 500 mg by mouth 2 (two) times daily with a meal.  11/04/14  Yes Historical Provider, MD  metoprolol succinate (TOPROL-XL) 25 MG 24 hr tablet Take 1 tablet (25 mg total) by mouth daily. 05/10/14  Yes Candee Furbish, MD  Multiple Vitamin (MULTIVITAMIN WITH MINERALS) TABS tablet Take 1 tablet by mouth every evening.    Yes Historical Provider, MD  naproxen (NAPRELAN) 500 MG 24 hr tablet Take 500 mg by mouth daily as needed (Pain).    Yes Historical Provider, MD  omeprazole (PRILOSEC) 20 MG capsule Take 20 mg by mouth every morning.    Yes Historical Provider, MD  rosuvastatin (CRESTOR) 10 MG tablet Take 10 mg by mouth every morning.    Yes Historical Provider, MD  sertraline (ZOLOFT) 100 MG tablet Take 50 mg by mouth every morning.  10/05/14  Yes Historical Provider, MD  terazosin (HYTRIN) 5 MG capsule Take 5 mg by mouth at bedtime.   Yes Historical Provider, MD  triamcinolone (NASACORT ALLERGY 24HR) 55 MCG/ACT AERO nasal inhaler Place 2 sprays into the nose daily as needed (Nasal congestion).   Yes Historical Provider, MD  metformin (FORTAMET) 500 MG (OSM) 24 hr tablet Take 500 mg by mouth 2 (two) times daily.    Historical Provider, MD  methotrexate (RHEUMATREX) 2.5 MG tablet Take 15 mg by mouth once a week. Take on Thursdays.  Caution:Chemotherapy. Protect from light.    Historical Provider, MD  sildenafil (VIAGRA) 100 MG tablet Take 100 mg by mouth daily as needed for erectile dysfunction.    Historical Provider, MD      Allergies:  Allergies  Allergen Reactions  . Fruit & Vegetable Daily [Nutritional Supplements]     Papaya  . Hydrochlorothiazide Other (See Comments)     Gout  . Other Diarrhea    Splenda  . Peanut-Containing Drug Products     Peanuts/Pistachios...Marland KitchenMarland KitchenMarland Kitchen Anaphylaxis  . Codeine Itching and Rash    History   Social History  . Marital Status: Married    Spouse Name: N/A    Number of Children: N/A  . Years of Education: N/A   Occupational History  . Not on file.   Social History Main Topics  . Smoking status: Former Smoker    Quit date: 09/29/1964  . Smokeless tobacco: Not on file  . Alcohol Use: 1.2 oz/week    2 Glasses of wine per week  . Drug Use: No  . Sexual Activity: Yes   Other  Topics Concern  . Not on file   Social History Narrative     Family History  Problem Relation Age of Onset  . Heart disease Father      ROS:  Please see the history of present illness.     All other systems reviewed and negative.    Physical Exam: Blood pressure 118/87, pulse 129, temperature 98.1 F (36.7 C), temperature source Oral, resp. rate 14, SpO2 93 %. General: Well developed, well nourished male in no acute distress. Head: Normocephalic, atraumatic, sclera non-icteric, no xanthomas, nares are without discharge. EENT: normal Lymph Nodes:  none Back: without scoliosis/kyphosis, no CVA tendersness Neck: Negative for carotid bruits. JVD not elevated. Lungs: Clear bilaterally to auscultation without wheezes, rales, or rhonchi. Breathing is unlabored. Heart: RRR with S1 S2. 2/6 systolic  Murmur which changes with RR intervals  , rubs, or gallops appreciated. Abdomen: Soft, non-tender, non-distended with normoactive bowel sounds. No hepatomegaly. No rebound/guarding. No obvious abdominal masses. Msk:  Strength and tone appear normal for age. Extremities: No clubbing or cyanosis tredema.  Distal pedal pulses are 2+ and equal bilaterally. Skin: Warm and Dry Neuro: Alert and oriented X 3. CN III-XII intact Grossly normal sensory and motor function . Psych:  Responds to questions appropriately with a normal affect.      Labs: Cardiac  Enzymes No results for input(s): CKTOTAL, CKMB, TROPONINI in the last 72 hours. CBC Lab Results  Component Value Date   WBC 8.4 11/20/2014   HGB 12.4* 11/20/2014   HCT 38.0* 11/20/2014   MCV 106.4* 11/20/2014   PLT 207 11/20/2014   PROTIME: No results for input(s): LABPROT, INR in the last 72 hours. Chemistry  Recent Labs Lab 11/20/14 1036  NA 143  K 4.2  CL 110  CO2 18*  BUN 28*  CREATININE 1.05  CALCIUM 9.9  PROT 7.2  BILITOT 0.6  ALKPHOS 106  ALT 13  AST 18  GLUCOSE 139*   Lipids Lab Results  Component Value Date   CHOL  04/11/2008    102        ATP III CLASSIFICATION:  <200     mg/dL   Desirable  200-239  mg/dL   Borderline High  >=240    mg/dL   High   HDL 21* 04/11/2008   LDLCALC  04/11/2008    57        Total Cholesterol/HDL:CHD Risk Coronary Heart Disease Risk Table                     Men   Women  1/2 Average Risk   3.4   3.3   TRIG 121 04/11/2008   BNP No results found for: PROBNP Miscellaneous No results found for: DDIMER  Radiology/Studies:  Dg Chest Port 1 View  11/20/2014   CLINICAL DATA:  Palpitation, history of hypertension and CABG, diabetes  EXAM: PORTABLE CHEST - 1 VIEW  COMPARISON:  Portable chest x-ray of 08/13/2010  FINDINGS: There are more prominent interstitial markings primarily at the lung bases suspicious for mild interstitial edema. No definite effusion is seen. Mild cardiomegaly is stable. Median sternotomy sutures are noted from prior CABG.  IMPRESSION: Prominent interstitial markings may indicate mild interstitial edema.   Electronically Signed   By: Ivar Drape M.D.   On: 11/20/2014 12:07    EKG:  WCT with morphology consistent with sinus  LAFB With frequent PVCs   Assessment and Plan:  Wide-complex tachycardia consistent with SVT-likely reentrant based on response  to carotid massage and adenosine  Ischemic heart disease with normal LV function and prior inferior wall MI  PVCs-frequent  The patient has incessant  SVT. We discussed medication options specifically antiarrhythmic therapy as well as catheter ablation. He would prefer the latter. I discussed this with Dr. Greggory Brandy was in the hospital tomorrow. We will use amiodarone overnight to try to suppress his SVT and then there will be further consideration tomorrow of catheter ablation. We reviewed the benefits and risks of the procedure including but not limited to death heart block and stroke. He understands and is willing to proceed.   not withstanding his age I think this is a reasonable plan.       Virl Axe

## 2014-11-20 NOTE — ED Notes (Signed)
Paged Cardiology in regards to medication orders. Received call back from [place] who provided Dr. Danie Chandler Pager number.  Dr. Jens Som paged at this time.

## 2014-11-20 NOTE — ED Notes (Addendum)
Heart rate pause noted with rate dropping back down to 80bpm for approx 30seconds, rate back to 140bpm. Dr. Rogene Houston remains at bedside. Pt tolerated Adenosine well - is A&Ox4 and in NAD.

## 2014-11-20 NOTE — ED Notes (Signed)
Pt brought from [facility] where he was being seen for an annual physical, by [facility] with c/o SVT. Pt was found to be in SVT at the Physician office, staff coached patient through vagal maneuvers that converted the patient back to NSR with intermittent PVC's. PT is A&Ox4 and denies any complaints.

## 2014-11-20 NOTE — ED Provider Notes (Addendum)
CSN: [id number]     Arrival date & time 11/20/14  9326 History   First MD Initiated Contact with Patient 11/20/14 [phone]     Chief Complaint  Patient presents with  . Tachycardia     (Consider location/radiation/quality/duration/timing/severity/associated sxs/prior Treatment) The history is provided by the patient.   80 year old male sent in from primary care office followed by Dr. Inda Merlin. Also followed by [facility] cardiology. Patient was in supraventricular tachycardia in the office he went in for routine physical had no idea that his heart was going fast. Patient denied any chest pain or shortness of breath. Patient had conversion the SVT with vagal maneuvers done in the office. Patient was then brought here for further evaluation. Patient's past medical history list subjective tachycardia but no clear history according to the patient. Patient was in sinus rhythm upon arrival here.  Past Medical History  Diagnosis Date  . Coronary artery disease   . Myocardial infarction   . Angina   . Hypertension   . Heart murmur   . Shortness of breath   . Anemia   . GERD (gastroesophageal reflux disease)   . Arthritis   . ASVD (arteriosclerotic vascular disease)     , s/p CAGB-presentation acute IMI, medically aborted-Dr. Rodell Perna  . Hypercholesteremia   . Erectile dysfunction   . Depression   . Decreased libido   . Orchitis and epididymitis   . Prostatitis   . BPH (benign prostatic hyperplasia)   . Bradycardia   . Syncope   . Supraventricular tachycardia   . Bicuspid aortic valve   . Rheumatoid arthritis 10/02/2013    Dr. Lenna Gilford, methotrexate   Past Surgical History  Procedure Laterality Date  . Cardiac catheterization    . Total knee arthroplasty    . Coronary artery bypass graft Bilateral 2009  . Intraocular lens insertion  2003  . Replacement total knee bilateral  2008  . Colonoscopy  04/06/2012    Procedure: COLONOSCOPY;  Surgeon: Garlan Fair, MD;  Location: [facility];   Service: Endoscopy;  Laterality: N/A;  . Esophagogastroduodenoscopy  04/06/2012    Procedure: ESOPHAGOGASTRODUODENOSCOPY (EGD);  Surgeon: Garlan Fair, MD;  Location: [facility];  Service: Endoscopy;  Laterality: N/A;  . Balloon dilation  04/06/2012    Procedure: BALLOON DILATION;  Surgeon: Garlan Fair, MD;  Location: [facility];  Service: Endoscopy;  Laterality: N/A;  . Cholecystectomy    . Appendectomy    . Knee arthroscopy Right   . Cataract extraction, bilateral      , with IOLs  . Finger surgery     Family History  Problem Relation Age of Onset  . Heart disease Father    History  Substance Use Topics  . Smoking status: Former Smoker    Quit date: 09/29/1964  . Smokeless tobacco: Not on file  . Alcohol Use: 1.2 oz/week    2 Glasses of wine per week    Review of Systems  Constitutional: Negative for fever.  HENT: Negative for congestion.   Eyes: Negative for visual disturbance.  Respiratory: Negative for shortness of breath.   Cardiovascular: Negative for chest pain.  Gastrointestinal: Negative for abdominal pain.  Genitourinary: Negative for dysuria.  Musculoskeletal: Negative for back pain.  Skin: Negative for rash.  Neurological: Negative for headaches.  Hematological: Does not bruise/bleed easily.  Psychiatric/Behavioral: Negative for confusion.      Allergies  Fruit & vegetable daily; Hydrochlorothiazide; Other; Peanut-containing drug products; and Codeine  Home Medications   Prior  to Admission medications   Medication Sig Start Date End Date Taking? Authorizing Provider  allopurinol (ZYLOPRIM) 300 MG tablet Take 300 mg by mouth every evening.    Yes Historical Provider, MD  amLODipine-benazepril (LOTREL) 5-20 MG per capsule Take 1 capsule by mouth every evening.    Yes Historical Provider, MD  aspirin 81 MG tablet Take 81 mg by mouth every morning.    Yes Historical Provider, MD  colchicine 0.6 MG tablet Take 0.6 mg by mouth as needed.    Yes  Historical Provider, MD  diphenhydrAMINE (BENADRYL) 25 MG tablet Take 25 mg by mouth daily as needed for allergies.   Yes Historical Provider, MD  Diphenhydramine Cit-Aspirin 38.3-500 MG TABS Take 1 tablet by mouth daily as needed (Headache).  11/12/14  Yes Historical Provider, MD  ferrous sulfate 325 (65 FE) MG tablet Take 325 mg by mouth 2 (two) times daily.   Yes Historical Provider, MD  Flaxseed, Linseed, (FLAXSEED OIL MAX STR) 1300 MG CAPS Take 1 tablet by mouth every morning.    Yes Historical Provider, MD  folic acid (FOLVITE) 1 MG tablet Take 1 mg by mouth every morning.    Yes Historical Provider, MD  geriatric multivitamins-minerals (ELDERTONIC/GEVRABON) ELIX Take 15 mLs by mouth every morning.    Yes Historical Provider, MD  loratadine (CLARITIN) 10 MG tablet Take 10 mg by mouth every morning.    Yes Historical Provider, MD  metFORMIN (GLUCOPHAGE) 500 MG tablet Take 500 mg by mouth 2 (two) times daily with a meal.  11/04/14  Yes Historical Provider, MD  metoprolol succinate (TOPROL-XL) 25 MG 24 hr tablet Take 1 tablet (25 mg total) by mouth daily. 05/10/14  Yes Candee Furbish, MD  Multiple Vitamin (MULTIVITAMIN WITH MINERALS) TABS tablet Take 1 tablet by mouth every evening.    Yes Historical Provider, MD  naproxen (NAPRELAN) 500 MG 24 hr tablet Take 500 mg by mouth daily as needed (Pain).    Yes Historical Provider, MD  omeprazole (PRILOSEC) 20 MG capsule Take 20 mg by mouth every morning.    Yes Historical Provider, MD  rosuvastatin (CRESTOR) 10 MG tablet Take 10 mg by mouth every morning.    Yes Historical Provider, MD  sertraline (ZOLOFT) 100 MG tablet Take 50 mg by mouth every morning.  10/05/14  Yes Historical Provider, MD  terazosin (HYTRIN) 5 MG capsule Take 5 mg by mouth at bedtime.   Yes Historical Provider, MD  triamcinolone (NASACORT ALLERGY 24HR) 55 MCG/ACT AERO nasal inhaler Place 2 sprays into the nose daily as needed (Nasal congestion).   Yes Historical Provider, MD  metformin  (FORTAMET) 500 MG (OSM) 24 hr tablet Take 500 mg by mouth 2 (two) times daily.    Historical Provider, MD  methotrexate (RHEUMATREX) 2.5 MG tablet Take 15 mg by mouth once a week. Take on Thursdays.  Caution:Chemotherapy. Protect from light.    Historical Provider, MD  sildenafil (VIAGRA) 100 MG tablet Take 100 mg by mouth daily as needed for erectile dysfunction.    Historical Provider, MD   BP 116/90 mmHg  Pulse 122  Temp(Src) 98.1 F (36.7 C) (Oral)  Resp 15  SpO2 94% Physical Exam  Constitutional: He is oriented to person, place, and time. He appears well-developed and well-nourished. No distress.  HENT:  Head: Normocephalic and atraumatic.  Mouth/Throat: Oropharynx is clear and moist.  Eyes: Conjunctivae and EOM are normal. Pupils are equal, round, and reactive to light.  Neck: Normal range of motion.  Cardiovascular: Normal  rate, regular rhythm and normal heart sounds.   Pulmonary/Chest: Effort normal and breath sounds normal. No respiratory distress.  Abdominal: Soft. Bowel sounds are normal. There is no tenderness.  Musculoskeletal: Normal range of motion. He exhibits no edema.  Neurological: He is alert and oriented to person, place, and time. No cranial nerve deficit. He exhibits normal muscle tone. Coordination normal.  Skin: Skin is warm. No rash noted.  Nursing note and vitals reviewed.   ED Course  Procedures (including critical care time) Labs Review Labs Reviewed  CBC WITH DIFFERENTIAL - Abnormal; Notable for the following:    RBC 3.57 (*)    Hemoglobin 12.4 (*)    HCT 38.0 (*)    MCV 106.4 (*)    MCH 34.7 (*)    All other components within normal limits  COMPREHENSIVE METABOLIC PANEL - Abnormal; Notable for the following:    CO2 18 (*)    Glucose, Bld 139 (*)    BUN 28 (*)    GFR calc non Af Amer 64 (*)    GFR calc Af Amer 75 (*)    All other components within normal limits   Results for orders placed or performed during the hospital encounter of 11/20/14   CBC with Differential  Result Value Ref Range   WBC 8.4 4.0 - 10.5 K/uL   RBC 3.57 (L) 4.22 - 5.81 MIL/uL   Hemoglobin 12.4 (L) 13.0 - 17.0 g/dL   HCT 38.0 (L) 39.0 - 52.0 %   MCV 106.4 (H) 78.0 - 100.0 fL   MCH 34.7 (H) 26.0 - 34.0 pg   MCHC 32.6 30.0 - 36.0 g/dL   RDW 13.7 11.5 - 15.5 %   Platelets 207 150 - 400 K/uL   Neutrophils Relative % 77 43 - 77 %   Neutro Abs 6.4 1.7 - 7.7 K/uL   Lymphocytes Relative 13 12 - 46 %   Lymphs Abs 1.1 0.7 - 4.0 K/uL   Monocytes Relative 9 3 - 12 %   Monocytes Absolute 0.8 0.1 - 1.0 K/uL   Eosinophils Relative 1 0 - 5 %   Eosinophils Absolute 0.1 0.0 - 0.7 K/uL   Basophils Relative 0 0 - 1 %   Basophils Absolute 0.0 0.0 - 0.1 K/uL  Comprehensive metabolic panel  Result Value Ref Range   Sodium 143 137 - 147 mEq/L   Potassium 4.2 3.7 - 5.3 mEq/L   Chloride 110 96 - 112 mEq/L   CO2 18 (L) 19 - 32 mEq/L   Glucose, Bld 139 (H) 70 - 99 mg/dL   BUN 28 (H) 6 - 23 mg/dL   Creatinine, Ser 1.05 0.50 - 1.35 mg/dL   Calcium 9.9 8.4 - 10.5 mg/dL   Total Protein 7.2 6.0 - 8.3 g/dL   Albumin 3.7 3.5 - 5.2 g/dL   AST 18 0 - 37 U/L   ALT 13 0 - 53 U/L   Alkaline Phosphatase 106 39 - 117 U/L   Total Bilirubin 0.6 0.3 - 1.2 mg/dL   GFR calc non Af Amer 64 (L) >90 mL/min   GFR calc Af Amer 75 (L) >90 mL/min   Anion gap 15 5 - 15     Imaging Review Dg Chest Port 1 View  11/20/2014   CLINICAL DATA:  Palpitation, history of hypertension and CABG, diabetes  EXAM: PORTABLE CHEST - 1 VIEW  COMPARISON:  Portable chest x-ray of 08/13/2010  FINDINGS: There are more prominent interstitial markings primarily at the lung bases  suspicious for mild interstitial edema. No definite effusion is seen. Mild cardiomegaly is stable. Median sternotomy sutures are noted from prior CABG.  IMPRESSION: Prominent interstitial markings may indicate mild interstitial edema.   Electronically Signed   By: Ivar Drape M.D.   On: 11/20/2014 12:07     EKG Interpretation None        Date: 11/20/2014  Rate: 149  Rhythm: supraventricular tachycardia (SVT)  QRS Axis: left  Intervals: normal  ST/T Wave abnormalities: nonspecific ST/T changes  Conduction Disutrbances:none  Narrative Interpretation:   Old EKG Reviewed: none available   Wide-complex tachycardia. Believe is not related to ventricular tachycardia.     CRITICAL CARE Performed by: Fredia Sorrow Total critical care time: 30 Critical care time was exclusive of separately billable procedures and treating other patients. Critical care was necessary to treat or prevent imminent or life-threatening deterioration. Critical care was time spent personally by me on the following activities: development of treatment plan with patient and/or surrogate as well as nursing, discussions with consultants, evaluation of patient's response to treatment, examination of patient, obtaining history from patient or surrogate, ordering and performing treatments and interventions, ordering and review of laboratory studies, ordering and review of radiographic studies, pulse oximetry and re-evaluation of patient's condition.     MDM   Final diagnoses:  Palpitation  SVT (supraventricular tachycardia)    Shortly after arrival here patient back went back into a fast heart rate with appeared to be SVT was a somewhat of a wide complex not consistent with the ventricular tachycardia heart rate was around 150. Patient was stable no symptoms at all. No hypotension. Adenosine was tried which was the block cut temporarily and then he went back into the SVT. Following that the diltiazem was tried did slow heart rate down occasionally would convert back to sinus rhythm. Cardiology consult. They recommended appropriately Amer around. Patient started on amiodarone. They will admit. Patient was seen by Dr. Caryl Comes.    Fredia Sorrow, MD 11/20/14 1518  Fredia Sorrow, MD 11/20/14 1525

## 2014-11-21 ENCOUNTER — Encounter (HOSPITAL_COMMUNITY)
Admission: EM | Disposition: A | Discharge status: Home / Self Care | Payer: Self-pay | Source: Home / Self Care | Attending: Cardiology

## 2014-11-21 ENCOUNTER — Encounter (HOSPITAL_COMMUNITY): Payer: Self-pay | Admitting: Physician Assistant

## 2014-11-21 LAB — LIPID PANEL
Cholesterol: 84 mg/dL (ref 0–200)
HDL: 24 mg/dL — ABNORMAL LOW (ref >39)
LDL Cholesterol: 44 mg/dL (ref 0–99)
Total CHOL/HDL Ratio: 3.5 RATIO
Triglycerides: 82 mg/dL (ref <150)
VLDL: 16 mg/dL (ref 0–40)

## 2014-11-21 LAB — CBC
HCT: 36 % — ABNORMAL LOW (ref 39.0–52.0)
Hemoglobin: 11.8 g/dL — ABNORMAL LOW (ref 13.0–17.0)
MCH: 35.6 pg — ABNORMAL HIGH (ref 26.0–34.0)
MCHC: 32.8 g/dL (ref 30.0–36.0)
MCV: 108.8 fL — ABNORMAL HIGH (ref 78.0–100.0)
Platelets: 205 10*3/uL (ref 150–400)
RBC: 3.31 MIL/uL — ABNORMAL LOW (ref 4.22–5.81)
RDW: 14.1 % (ref 11.5–15.5)
WBC: 7.6 10*3/uL (ref 4.0–10.5)

## 2014-11-21 LAB — BASIC METABOLIC PANEL
Anion gap: 12 (ref 5–15)
BUN: 19 mg/dL (ref 6–23)
CO2: 20 mEq/L (ref 19–32)
Calcium: 9.5 mg/dL (ref 8.4–10.5)
Chloride: 109 mEq/L (ref 96–112)
Creatinine, Ser: 1.04 mg/dL (ref 0.50–1.35)
GFR calc Af Amer: 76 mL/min — ABNORMAL LOW (ref >90)
GFR calc non Af Amer: 65 mL/min — ABNORMAL LOW (ref >90)
Glucose, Bld: 115 mg/dL — ABNORMAL HIGH (ref 70–99)
Potassium: 4.4 mEq/L (ref 3.7–5.3)
Sodium: 141 mEq/L (ref 137–147)

## 2014-11-21 LAB — GLUCOSE, CAPILLARY
Glucose-Capillary: 125 mg/dL — ABNORMAL HIGH (ref 70–99)
Glucose-Capillary: 98 mg/dL (ref 70–99)

## 2014-11-21 SURGERY — SUPRAVENTRICULAR TACHYCARDIA ABLATION
Anesthesia: LOCAL

## 2014-11-21 MED ORDER — AMIODARONE HCL 200 MG PO TABS: ORAL_TABLET | ORAL | Status: DC

## 2014-11-21 MED ORDER — AMIODARONE HCL 200 MG PO TABS
200.0000 mg | ORAL_TABLET | Freq: Two times a day (BID) | ORAL | Status: DC
Start: 2014-11-21 — End: 2014-11-21
  Administered 2014-11-21: 200 mg via ORAL
  Filled 2014-11-21: qty 1

## 2014-11-21 NOTE — Progress Notes (Addendum)
SUBJECTIVE: The patient is doing well today.  At this time, he denies chest pain, shortness of breath, or any new concerns.  His SVT has become quiescent with IV amiodarone.  Marland Kitchen allopurinol  300 mg Oral QPM  . amiodarone  200 mg Oral BID  . amLODipine  5 mg Oral q1800   And  . benazepril  20 mg Oral q1800  . aspirin EC  81 mg Oral Daily  . ferrous sulfate  325 mg Oral BID  . folic acid  1 mg Oral q morning - 10a  . heparin  5,000 Units Subcutaneous 3 times per day  . insulin aspart  0-5 Units Subcutaneous QHS  . loratadine  10 mg Oral q morning - 10a  . metoprolol succinate  25 mg Oral Daily  . pantoprazole  40 mg Oral Daily  . rosuvastatin  10 mg Oral q morning - 10a  . sertraline  50 mg Oral q morning - 10a  . sodium chloride  3 mL Intravenous Q12H  . terazosin  5 mg Oral QHS      OBJECTIVE: Physical Exam: Filed Vitals:   11/20/14 1937 11/20/14 2007 11/20/14 2356 11/21/14 0356  BP: 114/86 122/71 121/89 120/66  Pulse: 126 70 70 65  Temp: 98.6 F (37 C)  98.4 F (36.9 C) 98 F (36.7 C)  TempSrc: Oral  Oral Oral  Resp: 30 16 19 16   Weight:    139 lb 6.4 oz (63.231 kg)  SpO2: 94% 93% 94% 95%    Intake/Output Summary (Last 24 hours) at 11/21/14 0750 Last data filed at 11/20/14 1958  Gross per 24 hour  Intake      0 ml  Output    375 ml  Net   -375 ml    Telemetry reveals sinus rhythm with mid RP SVT yesterday, now improved.  GEN- The patient is well appearing, alert and oriented x 3 today.   Head- normocephalic, atraumatic Eyes-  Sclera clear, conjunctiva pink Ears- hearing intact Oropharynx- clear Neck- supple  Lungs- Clear to ausculation bilaterally, normal work of breathing Heart- Regular rate and rhythm 2/6 SEM GI- soft, NT, ND, + BS Extremities- no clubbing, cyanosis, or edema Skin- no rash or lesion Psych- euthymic mood, full affect Neuro- strength and sensation are intact  LABS: Basic Metabolic Panel:  Recent Labs  11/20/14 1036  11/21/14 0621  NA 143 141  K 4.2 4.4  CL 110 109  CO2 18* 20  GLUCOSE 139* 115*  BUN 28* 19  CREATININE 1.05 1.04  CALCIUM 9.9 9.5   Liver Function Tests:  Recent Labs  11/20/14 1036  AST 18  ALT 13  ALKPHOS 106  BILITOT 0.6  PROT 7.2  ALBUMIN 3.7   No results for input(s): LIPASE, AMYLASE in the last 72 hours. CBC:  Recent Labs  11/20/14 1036 11/21/14 0621  WBC 8.4 7.6  NEUTROABS 6.4  --   HGB 12.4* 11.8*  HCT 38.0* 36.0*  MCV 106.4* 108.8*  PLT 207 205   Fasting Lipid Panel:  Recent Labs  11/21/14 0621  CHOL 84  HDL 24*  LDLCALC 44  TRIG 82  CHOLHDL 3.5   Thyroid Function Tests:  Recent Labs  11/20/14 1450  TSH 2.740   Anemia Panel: No results for input(s): VITAMINB12, FOLATE, FERRITIN, TIBC, IRON, RETICCTPCT in the last 72 hours.  RADIOLOGY: Dg Chest Port 1 View  11/20/2014   CLINICAL DATA:  Palpitation, history of hypertension and CABG, diabetes  EXAM: PORTABLE  CHEST - 1 VIEW  COMPARISON:  Portable chest x-ray of 08/13/2010  FINDINGS: There are more prominent interstitial markings primarily at the lung bases suspicious for mild interstitial edema. No definite effusion is seen. Mild cardiomegaly is stable. Median sternotomy sutures are noted from prior CABG.  IMPRESSION: Prominent interstitial markings may indicate mild interstitial edema.   Electronically Signed   By: Ivar Drape M.D.   On: 11/20/2014 12:07    ASSESSMENT AND PLAN:  Active Problems:   SVT (supraventricular tachycardia)  1. SVT The patient presents with adenosine sensitive mid RP SVT.  He has been placed on IV amiodarone and his SVT is more quiescent this am.  Therapeutic strategies for supraventricular tachycardia including medicine and ablation were discussed in detail with the patient today. Risk, benefits, and alternatives to EP study and radiofrequency ablation were also discussed in detail today. These risks include but are not limited to stroke, bleeding, vascular damage,  tamponade, perforation, damage to the heart and other structures, AV block requiring pacemaker, worsening renal function, and death. The patient understands these risk and wishes to further contemplate his options.  He would like to transition to oral Amiodarone this am.  IF he has further SVT then he would like to proceed with ablation.  If he has no further SVT then he may consider a more conservative approach. I told him that I would return in 2-3 hours for further discussion.  2. CAD No ischemic symptoms No indication for ischemic workup at this time.  3. HTN Stable No change required today   Thompson Grayer, MD 11/21/2014 7:50 AM     Addendum Pt without any further SVT all day with ambulation on the floor.  Off of IV amiodarone and doing well on oral amiodarone. He is clear that he would prefer to continue oral therapy and will consider ablation if he does not tolerate amiodarone or has further SVT.  Risks, benefits, and alternatives to amiodarone were discussed at length with the patient by Dr Caryl Comes (yesterday) and by me again today.  He continues to prefer a medicine approach at this time.  Will dc to home on amiodarone 200mg  BID x 7 days, then 200mg  daily Follow-up with me in 4 weeks  He should contact my office should any problems arise in the interim.

## 2014-11-21 NOTE — Progress Notes (Signed)
UR Completed Loray Akard Graves-Bigelow, RN,BSN [phone]  

## 2014-11-21 NOTE — Discharge Instructions (Signed)
Cardiac Diet  A cardiac diet can help stop heart disease or a stroke from happening. It involves eating less unhealthy fats and eating more healthy fats.   FOODS TO AVOID OR LIMIT  · Limit saturated fats. This type of fat is found in oils and dairy products, such as:  ¨ Coconut oil.  ¨ Palm oil.  ¨ Cocoa butter.  ¨ Butter.  · Avoid trans-fat or hydrogenated oils. These are found in fried or pre-made baked goods, such as:  ¨ Margarine.  ¨ Pre-made cookies, cakes, and crackers.  · Limit processed meats (hot dogs, deli meats, sausage) to 3 ounces a week.  · Limit high-fat meats (marbled meats, fried chicken, or chicken with skin) to 3 ounces a week.  · Limit salt (sodium) to 1500 milligrams a day.  ·  Limit sweets and drinks with added sugar to no more than 5 servings a week. One serving is:  ¨ 1 tablespoon of sugar.  ¨ 1 tablespoon of jelly or jam.  ¨ ½ cup sorbet.  ¨ 1 cup lemonade.  ¨ ½ cup regular soda.  EAT MORE OF THE FOLLOWING FOODS  Fruit  · Eat 4 to 5 servings a day. One serving of fruit is:  ¨ 1 medium whole fruit.  ¨ ¼ cup dried fruit.  ¨ ½ cup of fresh, frozen, or canned fruit.  ¨ ½ cup 100% fruit juice.  Vegetables  · Eat 4 to 5 servings a day. One serving is:  ¨ 1 cup raw leafy vegetables.  ¨ ½ cup raw or cooked, cut-up vegetables.  ¨ ½ cup vegetable juice.  Whole Grains  · Eat 3 servings a day (1 ounce equals 1 serving).  Legumes (such as beans, peas, and lentils)   · Eat at least 4 servings a week (½ cup equals 1 serving).  Nuts and Seeds   · Eat at least 4 servings a week (¼ cup equals 1 serving).  Dietary Fiber  · Eat 20 to 30 grams a day. Some foods high in dietary fiber include:  ¨ Dried beans.  ¨ Citrus fruits.  ¨ Apples, bananas.  ¨ Broccoli, Brussels sprouts, and eggplant.  ¨ Oats.  Omega-3 Fats  · Eat food with omega-3 fats. You can also take a dietary pill (supplement) that has 1 gram of DHA and EPA. Have 3.5 ounces of fatty fish a week, such as:  ¨ Salmon.  ¨ Mackerel.  ¨ Albacore  tuna.  ¨ Sardines.  ¨ Lake trout.  ¨ Herring.  PREPARING YOUR FOOD  · Broil, bake, steam, or roast foods. Do not fry food. Do not cook food in butter (fat).  · Use non-stick cooking sprays.  · Remove skin from poultry, such as chicken and turkey.  · Remove fat from meat.  · Take the fat off the top of stews, soups, and gravy.  · Use lemon or herbs to flavor food instead of using butter or margarine.  · Use nonfat yogurt, salsa, or low-fat dressings for salads.  Document Released: 05/30/2012 Document Reviewed: 05/30/2012  [employer]® Patient Information ©2015 [employer]. This information is not intended to replace advice given to you by your health care provider. Make sure you discuss any questions you have with your health care provider.

## 2014-11-21 NOTE — Plan of Care (Signed)
Problem: Consults Goal: Ventricular Arrhythmia Patient Education (See Patient Education module for education specifics.)  Outcome: Completed/Met Date Met:  11/21/14 Goal: Skin Care Protocol Initiated - if Braden Score 18 or less If consults are not indicated, leave blank or document N/A  Outcome: Completed/Met Date Met:  11/21/14 Goal: Tobacco Cessation referral if indicated Outcome: Not Applicable Date Met:  83/46/21 Goal: Nutrition Consult-if indicated Outcome: Completed/Met Date Met:  11/21/14 Goal: Diabetes Guidelines if Diabetic/Glucose > 140 If diabetic or lab glucose is > 140 mg/dl - Initiate Diabetes/Hyperglycemia Guidelines & Document Interventions  Outcome: Completed/Met Date Met:  11/21/14  Problem: Phase I Progression Outcomes Goal: Pain controlled with appropriate interventions Outcome: Completed/Met Date Met:  11/21/14 Goal: OOB as tolerated unless otherwise ordered Outcome: Completed/Met Date Met:  11/21/14 Goal: Arrhythmia controlled or corrected Outcome: Completed/Met Date Met:  11/21/14 Goal: Hemodynamically stable Outcome: Completed/Met Date Met:  11/21/14 Goal: Electrolytes normal or corrected Outcome: Completed/Met Date Met:  11/21/14 Goal: Initial discharge plan identified Outcome: Completed/Met Date Met:  11/21/14 Goal: Voiding-avoid urinary catheter unless indicated Outcome: Completed/Met Date Met:  11/21/14 Goal: Other Phase I Outcomes/Goals Outcome: Completed/Met Date Met:  11/21/14  Problem: Phase II Progression Outcomes Goal: Pain controlled Outcome: Completed/Met Date Met:  11/21/14 Goal: Progress activity as tolerated unless otherwise ordered Outcome: Completed/Met Date Met:  11/21/14 Goal: Mental status at or near baseline Outcome: Completed/Met Date Met:  11/21/14 Goal: Hemodynamically stable Outcome: Completed/Met Date Met:  11/21/14 Goal: Discharge plan established Outcome: Completed/Met Date Met:  11/21/14 Goal: Tolerating diet Outcome:  Completed/Met Date Met:  11/21/14

## 2014-11-21 NOTE — Discharge Summary (Signed)
Discharge Summary   Patient ID: Seth Simpson MRN: [id number], DOB/AGE: 78-Nov-1932 78 y.o. Admit date: 11/20/2014 D/C date:     11/21/2014  Primary Care Provider: Henrine Screws, MD Primary Cardiologist: Roshawna Colclasure  Primary Discharge Diagnoses:  1. Paroxysmal SVT (wide complex tachycardia)  Secondary Discharge Diagnoses:  Past Medical History  Diagnosis Date  . Coronary artery disease     a. s/p CABG 03/2008 - presentation acute IMI, medically aborted.  . Hypertension   . Heart murmur   . Anemia   . GERD (gastroesophageal reflux disease)   . Hypercholesteremia   . Erectile dysfunction   . Depression   . Decreased libido   . Orchitis and epididymitis   . Prostatitis   . BPH (benign prostatic hyperplasia)   . Bradycardia   . Syncope   . Bicuspid aortic valve   . Myocardial infarction ?2009  . Type II diabetes mellitus   . Sinus headache   . Arthritis     "right hand" (11/20/2014)  . Rheumatoid arthritis 10/02/2013    Dr. Lenna Gilford, methotrexate  . Aortic stenosis     a. Echo 04/2014: mild AS.   Marland Kitchen PVC's (premature ventricular contractions)   . Left anterior fascicular block    Hospital Course: Seth Simpson is 80 y/o with history of CAD s/p CABG 2009, HTN, GERD, HLD, SVT, bicuspid AV with mild AS 04/2014 who presented to [facility] yesterday with wide complex tachycardia. He had shown up at his PCP's office and was found to be asymptomatic but tachycardic. These were terminated with carotid sinus massage only to recur. He received adenosine with termination and immediate resumption.He was then treated with dilt, and had recurrent return to sinus with rapid return to tach. The QRS morphology was identical to sinus with freq PVCs. Has had a history of recurrent SVT associated with presyncope and terminating mostly with Valsalva. He has not had any syncope. Has had a history of recurrent SVT associated with presyncope and terminating mostly with Valsalva. His WCT was most  likely consistent with SVT - Has had a history of recurrent SVT associated with presyncope and terminating mostly with Valsalva. Dr. Caryl Comes recommended initiation of amiodarone overnight to assess response to SVT, along with consideration of catheter ablation.  While on IV amiodarone, SVT became more quiescent. Dr. Rayann Heman had a discussion with the patient about catheter ablation vs medical therapy. Since the patient is feeling better on amiodarone, Mr. Nam has elected to defer SVT ablation for now. If he has further SVT this will need to be reconsidered. Dr. Rayann Heman has seen and examined the patient today and feels he is stable for discharge. Dr. Rayann Heman has recommended to send him home on amiodarone 200mg  BID x 7 days then 200mg  daily. Further monitoring of organ systems while on amiodarone will be at discretion of primary cardiologist. Baseline TSH and LFTs were OK and one could consider outpatient baseline PFTs. I have sent a message to our [facility] office's scheduler requesting a follow-up appointment, and our office will call the patient with this information.  Of note labs showed a macrocytic anemia. He has h/o anemia but the macrocytosis appears new. He was asked to f/u PCP for this.  Discharge Vitals: Blood pressure 133/77, pulse 70, temperature 98.9 F (37.2 C), temperature source Oral, resp. rate 17, height 5\' 8"  (1.727 m), weight 139 lb 6.4 oz (63.231 kg), SpO2 96 %.  Labs: Lab Results  Component Value Date   WBC 7.6 11/21/2014  HGB 11.8* 11/21/2014   HCT 36.0* 11/21/2014   MCV 108.8* 11/21/2014   PLT 205 11/21/2014     Recent Labs Lab 11/20/14 1036 11/21/14 0621  NA 143 141  K 4.2 4.4  CL 110 109  CO2 18* 20  BUN 28* 19  CREATININE 1.05 1.04  CALCIUM 9.9 9.5  PROT 7.2  --   BILITOT 0.6  --   ALKPHOS 106  --   ALT 13  --   AST 18  --   GLUCOSE 139* 115*    Lab Results  Component Value Date   CHOL 84 11/21/2014   HDL 24* 11/21/2014   LDLCALC 44 11/21/2014    TRIG 82 11/21/2014     Diagnostic Studies/Procedures   Dg Chest Port 1 View  11/20/2014   CLINICAL DATA:  Palpitation, history of hypertension and CABG, diabetes  EXAM: PORTABLE CHEST - 1 VIEW  COMPARISON:  Portable chest x-ray of 08/13/2010  FINDINGS: There are more prominent interstitial markings primarily at the lung bases suspicious for mild interstitial edema. No definite effusion is seen. Mild cardiomegaly is stable. Median sternotomy sutures are noted from prior CABG.  IMPRESSION: Prominent interstitial markings may indicate mild interstitial edema.   Electronically Signed   By: Ivar Drape M.D.   On: 11/20/2014 12:07    Discharge Medications   Current Discharge Medication List    START taking these medications   Details  amiodarone (PACERONE) 200 MG tablet Take 1 tablet (200mg ) by mouth twice a day for 1 week, then decrease to 1 tablet daily. Qty: 60 tablet, Refills: 0      CONTINUE these medications which have NOT CHANGED   Details  allopurinol (ZYLOPRIM) 300 MG tablet Take 300 mg by mouth every evening.     amLODipine-benazepril (LOTREL) 5-20 MG per capsule Take 1 capsule by mouth every evening.     aspirin 81 MG tablet Take 81 mg by mouth every morning.     colchicine 0.6 MG tablet Take 0.6 mg by mouth as needed.     diphenhydrAMINE (BENADRYL) 25 MG tablet Take 25 mg by mouth daily as needed for allergies.    Diphenhydramine Cit-Aspirin 38.3-500 MG TABS Take 1 tablet by mouth daily as needed (Headache).     ferrous sulfate 325 (65 FE) MG tablet Take 325 mg by mouth 2 (two) times daily.    Flaxseed, Linseed, (FLAXSEED OIL MAX STR) 1300 MG CAPS Take 1 tablet by mouth every morning.     folic acid (FOLVITE) 1 MG tablet Take 1 mg by mouth every morning.     geriatric multivitamins-minerals (ELDERTONIC/GEVRABON) ELIX Take 15 mLs by mouth every morning.     loratadine (CLARITIN) 10 MG tablet Take 10 mg by mouth every morning.     metFORMIN (GLUCOPHAGE) 500 MG tablet  Take 500 mg by mouth 2 (two) times daily with a meal.     metoprolol succinate (TOPROL-XL) 25 MG 24 hr tablet Take 1 tablet (25 mg total) by mouth daily. Qty: 30 tablet, Refills: 6    Multiple Vitamin (MULTIVITAMIN WITH MINERALS) TABS tablet Take 1 tablet by mouth every evening.     naproxen (NAPRELAN) 500 MG 24 hr tablet Take 500 mg by mouth daily as needed (Pain).     omeprazole (PRILOSEC) 20 MG capsule Take 20 mg by mouth every morning.     rosuvastatin (CRESTOR) 10 MG tablet Take 10 mg by mouth every morning.     sertraline (ZOLOFT) 100 MG tablet Take 50  mg by mouth every morning.     terazosin (HYTRIN) 5 MG capsule Take 5 mg by mouth at bedtime.    triamcinolone (NASACORT ALLERGY 24HR) 55 MCG/ACT AERO nasal inhaler Place 2 sprays into the nose daily as needed (Nasal congestion).    methotrexate (RHEUMATREX) 2.5 MG tablet Take 15 mg by mouth once a week. Take on Thursdays.  Caution:Chemotherapy. Protect from light.    sildenafil (VIAGRA) 100 MG tablet Take 100 mg by mouth daily as needed for erectile dysfunction.        Disposition   The patient will be discharged in stable condition to home. Discharge Instructions    Diet - low sodium heart healthy    Complete by:  As directed      Increase activity slowly    Complete by:  As directed           Follow-up Information    Follow up with Thompson Grayer, MD.   Specialty:  Cardiology   Why:  Office will call you for your followup appointment. Call office if you have not heard back in 3 days.   Contact information:   [address] [place] [address] [phone]       Follow up with Henrine Screws, MD.   Specialty:  Internal Medicine   Why:  Your labwork showed anemia. You have a history of this but we want to make sure you contninue to follow up with your PCP for further monitoring.   Contact information:   [address] [place] [address] [phone]         Duration of  Discharge Encounter: Greater than 30 minutes including physician and PA time.  Signed, Melina Copa PA-C 11/21/2014, 2:34 PM  Co Sign: Thompson Grayer, MD 11/21/2014 3:44 PM

## 2014-12-04 LAB — PULMONARY FUNCTION TEST

## 2014-12-15 ENCOUNTER — Other Ambulatory Visit: Payer: Self-pay | Admitting: Physician Assistant

## 2014-12-18 ENCOUNTER — Encounter: Payer: Self-pay | Admitting: Physician Assistant

## 2014-12-18 ENCOUNTER — Ambulatory Visit (INDEPENDENT_AMBULATORY_CARE_PROVIDER_SITE_OTHER): Payer: Medicare PPO | Admitting: Physician Assistant

## 2014-12-18 VITALS — BP 138/72 | HR 60 | Ht 68.0 in | Wt 142.8 lb

## 2014-12-18 DIAGNOSIS — D539 Nutritional anemia, unspecified: Secondary | ICD-10-CM

## 2014-12-18 DIAGNOSIS — I2510 Atherosclerotic heart disease of native coronary artery without angina pectoris: Secondary | ICD-10-CM

## 2014-12-18 DIAGNOSIS — I10 Essential (primary) hypertension: Secondary | ICD-10-CM

## 2014-12-18 DIAGNOSIS — I471 Supraventricular tachycardia: Secondary | ICD-10-CM

## 2014-12-18 DIAGNOSIS — E785 Hyperlipidemia, unspecified: Secondary | ICD-10-CM

## 2014-12-18 DIAGNOSIS — I350 Nonrheumatic aortic (valve) stenosis: Secondary | ICD-10-CM

## 2014-12-18 NOTE — Patient Instructions (Signed)
Your physician recommends that you return for lab work in: 02/10/15 FOR TSH AND LFT YOU CAN COME IN ANYTIME BETWEEN THE HOURS OF 7:30 AM AND 4:30 PM; THIS IS NOT A FASTING LAB  FOLLOW UP WITH DR. Marlou Porch AS PLANNED  Your physician recommends that you continue on your current medications as directed. Please refer to the Current Medication list given to you today.

## 2014-12-18 NOTE — Progress Notes (Signed)
Cardiology Office Note   Date:  12/18/2014   ID:  Seth Simpson, DOB Feb 23, 1931, MRN [id number]  PCP:  Seth Screws, MD  Cardiologist:  Dr. Candee Simpson   Electrophysiologist:  Dr. Thompson Simpson    History of Present Illness: Seth Simpson is a 81 y.o. male with a hx of CAD s/p IMI followed by CABG in 03/2008, mild AS (possible bicuspid AV), LAFB, anemia, HTN, HL, PVCs, PSVT occurring after endoscopy in the past, RA.  Last seen by Dr. Marlou Simpson 10/2014.  He was admitted 12/9-12/10 with incessant SVT. He initially presented with wide complex tachycardia from his PCPs office. He had termination of rapid rhythm with carotid sinus massage as well as adenosine and diltiazem. However, tachycardia returned. He was ultimately placed on IV amiodarone with consideration of proceeding with catheter ablation. He was evaluated initially by Dr. Caryl Simpson and then Dr. Rayann Simpson. Patient preferred a medical approach. Therefore, his IV amiodarone was transitioned over to oral amiodarone.  He remained stable on this regimen. He returns for follow-up.  He is overall doing well since DC.  The patient denies chest pain, shortness of breath, syncope, orthopnea, PND or significant pedal edema. He denies palpitations.  He has noted less PVCs.  He has undergone PFTs with his PCP.   Studies:   - LHC (4/09):  LAD 50-70%, ostial/proximal D1 70-75%, proximal RI 95%, CFX subtotally occluded, RCA 95% >> CABG  - Echo (5/15):  Mild LVH, EF 60-65%, normal wall motion, mild AS (mean 12 mmHg) , mild MR   Recent Labs: 11/20/2014: ALT 13; TSH 2.740 11/21/2014: BUN 19; Creatinine 1.04; Hemoglobin 11.8*; LDL (calc) 44; Potassium 4.4; Sodium 141   CrCl cannot be calculated (Unknown ideal weight.).    Recent Radiology: Dg Chest Port 1 View  11/20/2014   CLINICAL DATA:  Palpitation, history of hypertension and CABG, diabetes  EXAM: PORTABLE CHEST - 1 VIEW  COMPARISON:  Portable chest x-ray of 08/13/2010  FINDINGS: There are more  prominent interstitial markings primarily at the lung bases suspicious for mild interstitial edema. No definite effusion is seen. Mild cardiomegaly is stable. Median sternotomy sutures are noted from prior CABG.  IMPRESSION: Prominent interstitial markings may indicate mild interstitial edema.   Electronically Signed   By: Seth Simpson M.D.   On: 11/20/2014 12:07      Wt Readings from Last 3 Encounters:  11/21/14 139 lb 6.4 oz (63.231 kg)  10/22/14 144 lb (65.318 kg)  04/04/14 145 lb (65.772 kg)     Past Medical History  Diagnosis Date  . Coronary artery disease     a. s/p CABG 03/2008 - presentation acute IMI, medically aborted.  . Hypertension   . Heart murmur   . Anemia   . GERD (gastroesophageal reflux disease)   . Hypercholesteremia   . Erectile dysfunction   . Depression   . Decreased libido   . Orchitis and epididymitis   . Prostatitis   . BPH (benign prostatic hyperplasia)   . Bradycardia   . Syncope   . Supraventricular tachycardia     a. Started on amio 11/2014.  Marland Kitchen Bicuspid aortic valve   . Myocardial infarction ?2009  . Type II diabetes mellitus   . Sinus headache   . Arthritis     "right hand" (11/20/2014)  . Rheumatoid arthritis 10/02/2013    Dr. Lenna Simpson, methotrexate  . Aortic stenosis     a. Echo 04/2014: mild AS.   Marland Kitchen PVC's (premature ventricular contractions)   .  Left anterior fascicular block     Current Outpatient Prescriptions  Medication Sig Dispense Refill  . allopurinol (ZYLOPRIM) 300 MG tablet Take 300 mg by mouth every evening.     Marland Kitchen amiodarone (PACERONE) 200 MG tablet Take 1 tablet (200 mg total) by mouth daily. 30 tablet 0  . amLODipine-benazepril (LOTREL) 5-20 MG per capsule Take 1 capsule by mouth every evening.     Marland Kitchen aspirin 81 MG tablet Take 81 mg by mouth every morning.     . colchicine 0.6 MG tablet Take 0.6 mg by mouth as needed.     . diphenhydrAMINE (BENADRYL) 25 MG tablet Take 25 mg by mouth daily as needed for allergies.    .  Diphenhydramine Cit-Aspirin 38.3-500 MG TABS Take 1 tablet by mouth daily as needed (Headache).     . ferrous sulfate 325 (65 FE) MG tablet Take 325 mg by mouth 2 (two) times daily.    . Flaxseed, Linseed, (FLAXSEED OIL MAX STR) 1300 MG CAPS Take 1 tablet by mouth every morning.     . folic acid (FOLVITE) 1 MG tablet Take 1 mg by mouth every morning.     Marland Kitchen geriatric multivitamins-minerals (ELDERTONIC/GEVRABON) ELIX Take 15 mLs by mouth every morning.     . loratadine (CLARITIN) 10 MG tablet Take 10 mg by mouth every morning.     . metFORMIN (GLUCOPHAGE) 500 MG tablet Take 500 mg by mouth 2 (two) times daily with a meal.     . methotrexate (RHEUMATREX) 2.5 MG tablet Take 15 mg by mouth once a week. Take on Thursdays.  Caution:Chemotherapy. Protect from light.    . metoprolol succinate (TOPROL-XL) 25 MG 24 hr tablet Take 1 tablet (25 mg total) by mouth daily. 30 tablet 6  . Multiple Vitamin (MULTIVITAMIN WITH MINERALS) TABS tablet Take 1 tablet by mouth every evening.     . naproxen (NAPRELAN) 500 MG 24 hr tablet Take 500 mg by mouth daily as needed (Pain).     Marland Kitchen omeprazole (PRILOSEC) 20 MG capsule Take 20 mg by mouth every morning.     . rosuvastatin (CRESTOR) 10 MG tablet Take 10 mg by mouth every morning.     . sertraline (ZOLOFT) 100 MG tablet Take 50 mg by mouth every morning.     . sildenafil (VIAGRA) 100 MG tablet Take 100 mg by mouth daily as needed for erectile dysfunction.    Marland Kitchen terazosin (HYTRIN) 5 MG capsule Take 5 mg by mouth at bedtime.    . triamcinolone (NASACORT ALLERGY 24HR) 55 MCG/ACT AERO nasal inhaler Place 2 sprays into the nose daily as needed (Nasal congestion).     No current facility-administered medications for this visit.     Allergies:   Fruit & vegetable daily; Hydrochlorothiazide; Other; Peanut-containing drug products; and Codeine   Social History:  The patient  reports that he quit smoking about 50 years ago. His smoking use included Cigarettes. He has a 15  pack-year smoking history. He has never used smokeless tobacco. He reports that he drinks about 1.2 oz of alcohol per week. He reports that he does not use illicit drugs.   Family History:  The patient's family history includes Heart disease in his father.    ROS:  Please see the history of present illness.   He has a chronic NP cough.    All other systems reviewed and negative.    PHYSICAL EXAM: VS:  BP 138/72 mmHg  Pulse 60  Ht 5\' 8"  (1.727 m)  Wt 142 lb 12.8 oz (64.774 kg)  BMI 21.72 kg/m2 Well nourished, well developed, in no acute distress HEENT: normal Neck:  no JVD Cardiac:  normal S1, S2;  RRR; 2/6 systolic murmur RUSB Lungs:   clear to auscultation bilaterally, no wheezing, rhonchi or rales Abd: soft, nontender, no hepatomegaly Ext:  no edema Skin: warm and dry Neuro:  CNs 2-12 intact, no focal abnormalities noted  EKG:  NSR, HR 60, IVCD, LAD, PRWP, QTc 446 ms, no change from prior tracings      ASSESSMENT AND PLAN:  1. Paroxysmal SVT:  No apparent recurrence.  He is tolerating Amiodarone.  Continue current Rx.  Arrange FU LFTs and TSH in 6 weeks.  Obtain PFTs from PCP.  2. CAD s/p CABG:  No angina.  Continue ASA, beta blocker, statin.  3. Mild Aortic Stenosis:  Consider FU echo in 04/2015.   4. Hypertension:  Controlled.  5. Hyperlipidemia:  Continue statin.  6. Macrocytic Anemia:  Follow-up with PCP.     Disposition:   FU with Dr. Candee Simpson as planned.     Signed, Versie Starks, MHS 12/18/2014 7:52 AM    [facility] [place], [place], [place]  [address] Phone: [phone]; Fax: [phone]

## 2015-01-10 ENCOUNTER — Other Ambulatory Visit: Payer: Self-pay | Admitting: Internal Medicine

## 2015-02-10 ENCOUNTER — Telehealth: Payer: Self-pay | Admitting: *Deleted

## 2015-02-10 ENCOUNTER — Other Ambulatory Visit (INDEPENDENT_AMBULATORY_CARE_PROVIDER_SITE_OTHER): Payer: Medicare PPO | Admitting: *Deleted

## 2015-02-10 DIAGNOSIS — E785 Hyperlipidemia, unspecified: Secondary | ICD-10-CM

## 2015-02-10 DIAGNOSIS — I471 Supraventricular tachycardia: Secondary | ICD-10-CM

## 2015-02-10 LAB — HEPATIC FUNCTION PANEL
ALT: 17 U/L (ref 0–53)
AST: 20 U/L (ref 0–37)
Albumin: 3.8 g/dL (ref 3.5–5.2)
Alkaline Phosphatase: 94 U/L (ref 39–117)
BILIRUBIN TOTAL: 1 mg/dL (ref 0.2–1.2)
Bilirubin, Direct: 0.2 mg/dL (ref 0.0–0.3)
Total Protein: 7 g/dL (ref 6.0–8.3)

## 2015-02-10 LAB — TSH: TSH: 4.26 u[IU]/mL (ref 0.35–4.50)

## 2015-02-10 NOTE — Telephone Encounter (Signed)
pt and wife notified about lab results with verbal understanding

## 2015-03-04 ENCOUNTER — Encounter (HOSPITAL_BASED_OUTPATIENT_CLINIC_OR_DEPARTMENT_OTHER): Payer: Self-pay | Admitting: *Deleted

## 2015-03-04 ENCOUNTER — Emergency Department (HOSPITAL_BASED_OUTPATIENT_CLINIC_OR_DEPARTMENT_OTHER)
Admission: EM | Admit: 2015-03-04 | Discharge: 2015-03-05 | Disposition: A | Discharge status: Home / Self Care | Payer: Medicare PPO | Attending: Emergency Medicine | Admitting: Emergency Medicine

## 2015-03-04 DIAGNOSIS — Q231 Congenital insufficiency of aortic valve: Secondary | ICD-10-CM | POA: Insufficient documentation

## 2015-03-04 DIAGNOSIS — Y838 Other surgical procedures as the cause of abnormal reaction of the patient, or of later complication, without mention of misadventure at the time of the procedure: Secondary | ICD-10-CM | POA: Diagnosis not present

## 2015-03-04 DIAGNOSIS — Z862 Personal history of diseases of the blood and blood-forming organs and certain disorders involving the immune mechanism: Secondary | ICD-10-CM | POA: Insufficient documentation

## 2015-03-04 DIAGNOSIS — I2510 Atherosclerotic heart disease of native coronary artery without angina pectoris: Secondary | ICD-10-CM | POA: Diagnosis not present

## 2015-03-04 DIAGNOSIS — E780 Pure hypercholesterolemia: Secondary | ICD-10-CM | POA: Diagnosis not present

## 2015-03-04 DIAGNOSIS — N400 Enlarged prostate without lower urinary tract symptoms: Secondary | ICD-10-CM | POA: Diagnosis not present

## 2015-03-04 DIAGNOSIS — Z951 Presence of aortocoronary bypass graft: Secondary | ICD-10-CM | POA: Diagnosis not present

## 2015-03-04 DIAGNOSIS — M069 Rheumatoid arthritis, unspecified: Secondary | ICD-10-CM | POA: Diagnosis not present

## 2015-03-04 DIAGNOSIS — R011 Cardiac murmur, unspecified: Secondary | ICD-10-CM | POA: Insufficient documentation

## 2015-03-04 DIAGNOSIS — Z9861 Coronary angioplasty status: Secondary | ICD-10-CM | POA: Diagnosis not present

## 2015-03-04 DIAGNOSIS — Z9889 Other specified postprocedural states: Secondary | ICD-10-CM | POA: Diagnosis not present

## 2015-03-04 DIAGNOSIS — L7621 Postprocedural hemorrhage and hematoma of skin and subcutaneous tissue following a dermatologic procedure: Secondary | ICD-10-CM | POA: Diagnosis present

## 2015-03-04 DIAGNOSIS — F329 Major depressive disorder, single episode, unspecified: Secondary | ICD-10-CM | POA: Diagnosis not present

## 2015-03-04 DIAGNOSIS — I252 Old myocardial infarction: Secondary | ICD-10-CM | POA: Diagnosis not present

## 2015-03-04 DIAGNOSIS — I471 Supraventricular tachycardia: Secondary | ICD-10-CM | POA: Diagnosis not present

## 2015-03-04 DIAGNOSIS — T148XXA Other injury of unspecified body region, initial encounter: Secondary | ICD-10-CM

## 2015-03-04 DIAGNOSIS — Z87891 Personal history of nicotine dependence: Secondary | ICD-10-CM | POA: Diagnosis not present

## 2015-03-04 DIAGNOSIS — Z7982 Long term (current) use of aspirin: Secondary | ICD-10-CM | POA: Insufficient documentation

## 2015-03-04 DIAGNOSIS — I10 Essential (primary) hypertension: Secondary | ICD-10-CM | POA: Diagnosis not present

## 2015-03-04 DIAGNOSIS — K219 Gastro-esophageal reflux disease without esophagitis: Secondary | ICD-10-CM | POA: Insufficient documentation

## 2015-03-04 DIAGNOSIS — E119 Type 2 diabetes mellitus without complications: Secondary | ICD-10-CM | POA: Insufficient documentation

## 2015-03-04 DIAGNOSIS — Z79899 Other long term (current) drug therapy: Secondary | ICD-10-CM | POA: Diagnosis not present

## 2015-03-04 NOTE — ED Notes (Signed)
Pt had a cyst removed from his forehead this afternoon by Dr. Inda Merlin. At apprx. 7:30pm today, the wound opened and began bleeding. Direct pressure has not helped. Pt has wound covered with no bleeding noted through dressing.

## 2015-03-05 MED ORDER — LIDOCAINE-EPINEPHRINE 2 %-1:100000 IJ SOLN
INTRAMUSCULAR | Status: AC
  Filled 2015-03-05: qty 1

## 2015-03-05 MED ORDER — LIDOCAINE-EPINEPHRINE 2 %-1:100000 IJ SOLN
20.0000 mL | Freq: Once | INTRAMUSCULAR | Status: DC
Start: 2015-03-05 — End: 2015-03-05

## 2015-03-05 NOTE — ED Provider Notes (Addendum)
CSN: [id number]     Arrival date & time 03/04/15  2244 History   First MD Initiated Contact with Patient 03/05/15 0100     Chief Complaint  Patient presents with  . Bleeding/Bruising     (Consider location/radiation/quality/duration/timing/severity/associated sxs/prior Treatment) HPI  This is an 81 year old male who had a lesion surgically removed from his left temple yesterday by his PCP. He was doing fine until about 8 PM on the wound started bleeding. The bleeding persists despite application of multiple pressure dressings. There is minimal associated pain.  Past Medical History  Diagnosis Date  . Coronary artery disease     a. s/p CABG 03/2008 - presentation acute IMI, medically aborted.  . Hypertension   . Heart murmur   . Anemia   . GERD (gastroesophageal reflux disease)   . Hypercholesteremia   . Erectile dysfunction   . Depression   . Decreased libido   . Orchitis and epididymitis   . Prostatitis   . BPH (benign prostatic hyperplasia)   . Bradycardia   . Syncope   . Supraventricular tachycardia     a. Started on amio 11/2014.  Marland Kitchen Bicuspid aortic valve   . Myocardial infarction ?2009  . Type II diabetes mellitus   . Sinus headache   . Arthritis     "right hand" (11/20/2014)  . Rheumatoid arthritis 10/02/2013    Dr. Lenna Gilford, methotrexate  . Aortic stenosis     a. Echo 04/2014: mild AS.   Marland Kitchen PVC's (premature ventricular contractions)   . Left anterior fascicular block    Past Surgical History  Procedure Laterality Date  . Total knee arthroplasty Bilateral   . Replacement total knee bilateral  2008  . Colonoscopy  04/06/2012    Procedure: COLONOSCOPY;  Surgeon: Garlan Fair, MD;  Location: [facility];  Service: Endoscopy;  Laterality: N/A;  . Esophagogastroduodenoscopy  04/06/2012    Procedure: ESOPHAGOGASTRODUODENOSCOPY (EGD);  Surgeon: Garlan Fair, MD;  Location: [facility];  Service: Endoscopy;  Laterality: N/A;  . Balloon dilation  04/06/2012   Procedure: BALLOON DILATION;  Surgeon: Garlan Fair, MD;  Location: [facility];  Service: Endoscopy;  Laterality: N/A;  . Cataract extraction w/ intraocular lens  implant, bilateral Bilateral 2003  . Finger surgery Left 2000's    "removed BB", 2nd digit  . Appendectomy  1954  . Cholecystectomy open  1978  . Cardiac catheterization  2009  . Coronary artery bypass graft  2009    "CABG X?4"  . Joint replacement    . Coronary angioplasty     Family History  Problem Relation Age of Onset  . Heart disease Father    History  Substance Use Topics  . Smoking status: Former Smoker -- 1.00 packs/day for 15 years    Types: Cigarettes    Quit date: 09/29/1964  . Smokeless tobacco: Never Used  . Alcohol Use: 1.2 oz/week    2 Glasses of wine per week    Review of Systems  All other systems reviewed and are negative.   Allergies  Fruit & vegetable daily; Hydrochlorothiazide; Other; Peanut-containing drug products; and Codeine  Home Medications   Prior to Admission medications   Medication Sig Start Date End Date Taking? Authorizing Provider  allopurinol (ZYLOPRIM) 300 MG tablet Take 300 mg by mouth every evening.     Historical Provider, MD  amiodarone (PACERONE) 200 MG tablet TAKE 1 TABLET (200 MG TOTAL) BY MOUTH DAILY. 01/10/15   Thompson Grayer, MD  amLODipine-benazepril (LOTREL) 5-20 MG  per capsule Take 1 capsule by mouth every evening.     Historical Provider, MD  aspirin 81 MG tablet Take 81 mg by mouth every morning.     Historical Provider, MD  colchicine 0.6 MG tablet Take 0.6 mg by mouth as needed.     Historical Provider, MD  diphenhydrAMINE (BENADRYL) 25 MG tablet Take 25 mg by mouth daily as needed for allergies.    Historical Provider, MD  Diphenhydramine Cit-Aspirin 38.3-500 MG TABS Take 1 tablet by mouth daily as needed (Headache).  11/12/14   Historical Provider, MD  ferrous sulfate 325 (65 FE) MG tablet Take 325 mg by mouth 2 (two) times daily.    Historical Provider,  MD  Flaxseed, Linseed, (FLAXSEED OIL MAX STR) 1300 MG CAPS Take 1 tablet by mouth every morning.     Historical Provider, MD  folic acid (FOLVITE) 1 MG tablet Take 1 mg by mouth every morning.     Historical Provider, MD  loratadine (CLARITIN) 10 MG tablet Take 10 mg by mouth every morning.     Historical Provider, MD  metFORMIN (GLUCOPHAGE) 500 MG tablet Take 500 mg by mouth 2 (two) times daily with a meal.  11/04/14   Historical Provider, MD  methotrexate (RHEUMATREX) 2.5 MG tablet Take 15 mg by mouth once a week. Take on Thursdays.  Caution:Chemotherapy. Protect from light.    Historical Provider, MD  metoprolol succinate (TOPROL-XL) 25 MG 24 hr tablet Take 1 tablet (25 mg total) by mouth daily. 05/10/14   Jerline Pain, MD  Multiple Vitamin (MULTIVITAMIN WITH MINERALS) TABS tablet Take 1 tablet by mouth every evening.     Historical Provider, MD  naproxen (NAPRELAN) 500 MG 24 hr tablet Take 500 mg by mouth daily as needed (Pain).     Historical Provider, MD  omeprazole (PRILOSEC) 20 MG capsule Take 20 mg by mouth every morning.     Historical Provider, MD  rosuvastatin (CRESTOR) 10 MG tablet Take 10 mg by mouth every morning.     Historical Provider, MD  sertraline (ZOLOFT) 100 MG tablet Take 50 mg by mouth every morning.  10/05/14   Historical Provider, MD  sildenafil (VIAGRA) 100 MG tablet Take 100 mg by mouth daily as needed for erectile dysfunction.    Historical Provider, MD  terazosin (HYTRIN) 5 MG capsule Take 5 mg by mouth at bedtime.    Historical Provider, MD  triamcinolone (NASACORT ALLERGY 24HR) 55 MCG/ACT AERO nasal inhaler Place 2 sprays into the nose daily as needed (Nasal congestion).    Historical Provider, MD   BP 164/77 mmHg  Pulse 66  Temp(Src) 98.1 F (36.7 C) (Oral)  Resp 16  Ht 5\' 9"  (1.753 m)  Wt 140 lb (63.504 kg)  BMI 20.67 kg/m2  SpO2 100%   Physical Exam  General: Well-developed, well-nourished male in no acute distress; appearance consistent with age of  record HENT: normocephalic; sutured surgical wound left temple with blood dripping from the lower aspect of the wound Eyes: Normal appearance Neck: supple Heart: regular rate and rhythm Lungs: Normal respiratory effort and excursion Abdomen: soft; nondistended Extremities: Arthritic changes; normal range of motion for age Neurologic: Awake, alert and oriented; motor function intact in all extremities and symmetric; no facial droop Skin: Warm and dry Psychiatric: Normal mood and affect    ED Course  Procedures (including critical care time)  HEMOSTASIS The patient was prepped and draped in the usual sterile fashion. A single, interrupted 6-0 Ethilon suture was placed in  the inferior aspect of the wound. The wound continued to his blood and the bleeding was abated with the application of WoundSeal. A pressure dressing was then applied. The patient tolerated this well and there were no immediate complications. He was advised to leave the dressing in place at least 24 hours and contact his PCP as soon as possible.  MDM     Shanon Rosser, MD 03/05/15 2130  Shanon Rosser, MD 03/05/15 8657

## 2015-04-22 ENCOUNTER — Ambulatory Visit (INDEPENDENT_AMBULATORY_CARE_PROVIDER_SITE_OTHER): Payer: Medicare PPO | Admitting: Cardiology

## 2015-04-22 ENCOUNTER — Encounter: Payer: Self-pay | Admitting: Cardiology

## 2015-04-22 VITALS — BP 100/62 | HR 61 | Ht 69.0 in | Wt 145.2 lb

## 2015-04-22 DIAGNOSIS — I350 Nonrheumatic aortic (valve) stenosis: Secondary | ICD-10-CM | POA: Insufficient documentation

## 2015-04-22 DIAGNOSIS — Z79899 Other long term (current) drug therapy: Secondary | ICD-10-CM | POA: Diagnosis not present

## 2015-04-22 DIAGNOSIS — I444 Left anterior fascicular block: Secondary | ICD-10-CM | POA: Diagnosis not present

## 2015-04-22 DIAGNOSIS — I471 Supraventricular tachycardia: Secondary | ICD-10-CM | POA: Diagnosis not present

## 2015-04-22 NOTE — Progress Notes (Signed)
[facility] [address], [address] [place], [place]  [address] Phone: [phone] Fax:  [phone]  Date:  04/22/2015   ID:  Seth Simpson, DOB [birth year], MRN [id number]  PCP:  Henrine Screws, MD   History of Present Illness: Seth Simpson is a 81 y.o. male with coronary artery disease status post bypass surgery in April of 2009, prior myocardial infarction/inferior, history of anemia, left anterior fascicular block, PVCs, minimal aortic stenosis with possible bicuspid valve with hyperlipidemia here for followup.  Admitted on 11/20/14 with incessant SVT. Wide-complex tachycardia at his primary physician's all this. Terminated rapid rhythm with carotid sinus massage as well as adenosine as well as diltiazem. Tachycardia then returned. He was placed on IV amiodarone and considered catheter ablation. Dr. Caryl Comes and Dr. Rayann Heman both saw him. Preferred medical approach at this time. Oral amiodarone. Stable. Saw Richardson Dopp on 12/18/14. He has had pulmonary function studies with primary physician.  Tolerating statin well.  Crestor.    Eating steel cut oats for breakfast for years.  Denies any syncope, shortness of breath, chest pain.  Enjoys yardwork, [employer]. Feels good. Rheumatoid arthritis under good control.  Studies:  - LHC (4/09): LAD 50-70%, ostial/proximal D1 70-75%, proximal RI 95%, CFX subtotally occluded, RCA 95% >> CABG - Echo (5/15): Mild LVH, EF 60-65%, normal wall motion, mild AS (mean 12 mmHg) , mild MR   Recent Labs: 11/20/2014: ALT 13; TSH 2.740 11/21/2014: BUN 19; Creatinine 1.04; Hemoglobin 11.8*; LDL (calc) 44; Potassium 4.4; Sodium 141   Wt Readings from Last 3 Encounters:  04/22/15 145 lb 3.2 oz (65.862 kg)  03/04/15 140 lb (63.504 kg)  12/18/14 142 lb 12.8 oz (64.774 kg)     Past Medical History  Diagnosis Date  . Coronary artery disease     a. s/p CABG 03/2008 - presentation acute IMI, medically aborted.  . Hypertension   . Heart murmur   .  Anemia   . GERD (gastroesophageal reflux disease)   . Hypercholesteremia   . Erectile dysfunction   . Depression   . Decreased libido   . Orchitis and epididymitis   . Prostatitis   . BPH (benign prostatic hyperplasia)   . Bradycardia   . Syncope   . Supraventricular tachycardia     a. Started on amio 11/2014.  Marland Kitchen Bicuspid aortic valve   . Myocardial infarction ?2009  . Type II diabetes mellitus   . Sinus headache   . Arthritis     "right hand" (11/20/2014)  . Rheumatoid arthritis 10/02/2013    Dr. Lenna Gilford, methotrexate  . Aortic stenosis     a. Echo 04/2014: mild AS.   Marland Kitchen PVC's (premature ventricular contractions)   . Left anterior fascicular block     Past Surgical History  Procedure Laterality Date  . Total knee arthroplasty Bilateral   . Replacement total knee bilateral  2008  . Colonoscopy  04/06/2012    Procedure: COLONOSCOPY;  Surgeon: Garlan Fair, MD;  Location: [facility];  Service: Endoscopy;  Laterality: N/A;  . Esophagogastroduodenoscopy  04/06/2012    Procedure: ESOPHAGOGASTRODUODENOSCOPY (EGD);  Surgeon: Garlan Fair, MD;  Location: [facility];  Service: Endoscopy;  Laterality: N/A;  . Balloon dilation  04/06/2012    Procedure: BALLOON DILATION;  Surgeon: Garlan Fair, MD;  Location: [facility];  Service: Endoscopy;  Laterality: N/A;  . Cataract extraction w/ intraocular lens  implant, bilateral Bilateral 2003  . Finger surgery Left 2000's    "  removed BB", 2nd digit  . Appendectomy  1954  . Cholecystectomy open  1978  . Cardiac catheterization  2009  . Coronary artery bypass graft  2009    "CABG X?4"  . Joint replacement    . Coronary angioplasty      Current Outpatient Prescriptions  Medication Sig Dispense Refill  . allopurinol (ZYLOPRIM) 300 MG tablet Take 300 mg by mouth every evening.     Marland Kitchen amiodarone (PACERONE) 200 MG tablet TAKE 1 TABLET (200 MG TOTAL) BY MOUTH DAILY. 30 tablet 5  . amLODipine-benazepril (LOTREL) 5-20 MG per capsule  Take 1 capsule by mouth every evening.     Marland Kitchen aspirin 81 MG tablet Take 81 mg by mouth every morning.     . colchicine 0.6 MG tablet Take 0.6 mg by mouth as needed.     . diphenhydrAMINE (BENADRYL) 25 MG tablet Take 25 mg by mouth daily as needed for allergies.    . Diphenhydramine Cit-Aspirin 38.3-500 MG TABS Take 1 tablet by mouth daily as needed (Headache).     . ferrous sulfate 325 (65 FE) MG tablet Take 325 mg by mouth 2 (two) times daily.    . Flaxseed, Linseed, (FLAXSEED OIL MAX STR) 1300 MG CAPS Take 1 tablet by mouth every morning.     . folic acid (FOLVITE) 1 MG tablet Take 1 mg by mouth every morning.     . loratadine (CLARITIN) 10 MG tablet Take 10 mg by mouth every morning.     . metFORMIN (GLUCOPHAGE) 500 MG tablet Take 500 mg by mouth 2 (two) times daily with a meal.     . methotrexate (RHEUMATREX) 2.5 MG tablet Take 15 mg by mouth once a week. Take on Thursdays.  Caution:Chemotherapy. Protect from light.    . metoprolol succinate (TOPROL-XL) 25 MG 24 hr tablet Take 1 tablet (25 mg total) by mouth daily. 30 tablet 6  . Multiple Vitamin (MULTIVITAMIN WITH MINERALS) TABS tablet Take 1 tablet by mouth every evening.     . naproxen (NAPRELAN) 500 MG 24 hr tablet Take 500 mg by mouth daily as needed (Pain).     Marland Kitchen omeprazole (PRILOSEC) 20 MG capsule Take 20 mg by mouth every morning.     . rosuvastatin (CRESTOR) 10 MG tablet Take 10 mg by mouth 3 (three) times a week. MONDAYS , [place]    . sertraline (ZOLOFT) 100 MG tablet Take 50 mg by mouth every morning.     . sildenafil (VIAGRA) 100 MG tablet Take 100 mg by mouth daily as needed for erectile dysfunction.    Marland Kitchen terazosin (HYTRIN) 5 MG capsule Take 5 mg by mouth at bedtime.    . triamcinolone (NASACORT ALLERGY 24HR) 55 MCG/ACT AERO nasal inhaler Place 2 sprays into the nose daily as needed (Nasal congestion).     No current facility-administered medications for this visit.    Allergies:    Allergies  Allergen  Reactions  . Fruit & Vegetable Daily [Nutritional Supplements]     Papaya  . Hydrochlorothiazide Other (See Comments)    Gout  . Peanut-Containing Drug Products     Peanuts/Pistachios...Marland KitchenMarland KitchenMarland Kitchen Anaphylaxis  . Sucralose Diarrhea  . Codeine Itching and Rash    Social History:  The patient  reports that he quit smoking about 50 years ago. His smoking use included Cigarettes. He has a 15 pack-year smoking history. He has never used smokeless tobacco. He reports that he drinks about 1.2 oz of alcohol per week. He reports  that he does not use illicit drugs.    Family History: The patient's family history includes Heart disease in his father.     ROS:  Please see the history of present illness.  No syncope, no bleeding, no palps. Has been dx with RA. ?Gout      PHYSICAL EXAM: VS:  BP 100/62 mmHg  Pulse 61  Ht 5\' 9"  (1.753 m)  Wt 145 lb 3.2 oz (65.862 kg)  BMI 21.43 kg/m2 Well nourished, well developed, in no acute distress HEENT: normal Neck: no JVD Cardiac:  normal S1, T6;AUQ; 2/6 systolic murmur with radiation to carotids.  Lungs:  clear to auscultation bilaterally, no wheezing, rhonchi or rales Abd: soft, nontender, no hepatomegaly Ext: no edema Skin: warm and dry Neuro: no focal abnormalities noted  EKG:  10/22/14-sinus bradycardia rate 58 with frequent PVCs in bigeminy pattern, left axis deviation. Septal infarct pattern.-Prior Sinus rhythm, left anterior fascicular block, nonspecific ST T-wave changes, no significant change from prior EKG. Echocardiogram 2013: Mild aortic stenosis, mean gradient 13 mm mercury      ASSESSMENT AND PLAN:  1. RA-Dr. Lenna Gilford. Methotrexate. Monitoring liver function. He is doing well. Note that he is also on amiodarone. These 2 medications do have potential liver toxicity side effects. Continue closely monitoring. 2. HTN-was higher in the am hours. Currently under great control. He is not dizzy. 3. Paroxysmal supraventricular tachycardia-currently well  controlled now with amiodarone. This is after hospitalization in December 2015. He opted for noninvasive management. Both Dr. Caryl Comes and Dr. Rayann Heman saw him. Low-dose metoprolol. No recent issues with this. Prior episode of PSVT occurred post endoscopy. Valsalva maneuver.  4. Anterior fascicular block-currently monitoring, no syncope. 5. Sinoatrial node dysfunction-prior bradycardia. No significant symptoms currently. 6. Frequent PVCs-bigeminy pattern previously seen. Amiodarone has halted PVCs.  Low-dose metoprolol. 7. Old myocardial infarction-inferior wall 8. Coronary artery disease-prior bypass surgery 2009-secondary prevention 9. Mild aortic stenosis-continue to monitor, echocardiogram 2015 with mild aortic stenosis. Murmur appreciated. 10. Hyperlipidemia-Crestor. LDL GOAL 70. Dr. Inda Merlin has been monitoring. 11. Possible Congenital bicuspid aortic valve 12. Six-month follow-up. Continue to monitor liver function, thyroid.  Signed, Candee Furbish, MD [facility]  04/22/2015 3:25 PM

## 2015-04-22 NOTE — Patient Instructions (Signed)
Medication Instructions:  Your physician recommends that you continue on your current medications as directed. Please refer to the Current Medication list given to you today.  Follow-Up: Follow up in 6 months with Dr. Skains.  You will receive a letter in the mail 2 months before you are due.  Please call us when you receive this letter to schedule your follow up appointment.  Thank you for choosing [facility]!!     

## 2015-08-17 ENCOUNTER — Other Ambulatory Visit: Payer: Self-pay | Admitting: Internal Medicine

## 2015-08-22 ENCOUNTER — Other Ambulatory Visit: Payer: Self-pay

## 2015-08-22 MED ORDER — AMIODARONE HCL 200 MG PO TABS: ORAL_TABLET | ORAL | Status: DC

## 2015-08-22 NOTE — Telephone Encounter (Signed)
Jerline Pain, MD at 04/22/2015 3:03 PM  amiodarone (PACERONE) 200 MG tabletTAKE 1 TABLET (200 MG TOTAL) BY MOUTH DAILY  1. Paroxysmal supraventricular tachycardia-currently well controlled now with amiodarone. This is after hospitalization in December 2015. He opted for noninvasive management. Both Dr. Caryl Comes and Dr. Rayann Heman saw him. Low-dose metoprolol. No recent issues with this. Prior episode of PSVT occurred post endoscopy. Valsalva maneuver.   Notes Recorded by Liliane Shi, PA-C on 02/10/2015 at 5:06 PM TSH, LFTs ok Continue with current treatment plan. Richardson Dopp, PA-C  02/10/2015 5:06 PM

## 2015-10-22 ENCOUNTER — Other Ambulatory Visit: Payer: Self-pay | Admitting: Cardiology

## 2015-11-10 ENCOUNTER — Ambulatory Visit: Payer: Medicare PPO | Admitting: Cardiology

## 2015-11-17 DIAGNOSIS — R519 Headache, unspecified: Secondary | ICD-10-CM | POA: Insufficient documentation

## 2015-11-17 DIAGNOSIS — R51 Headache: Secondary | ICD-10-CM

## 2015-11-17 DIAGNOSIS — I493 Ventricular premature depolarization: Secondary | ICD-10-CM | POA: Insufficient documentation

## 2015-11-17 DIAGNOSIS — E119 Type 2 diabetes mellitus without complications: Secondary | ICD-10-CM | POA: Insufficient documentation

## 2015-11-17 DIAGNOSIS — I444 Left anterior fascicular block: Secondary | ICD-10-CM | POA: Insufficient documentation

## 2015-11-17 DIAGNOSIS — I219 Acute myocardial infarction, unspecified: Secondary | ICD-10-CM | POA: Insufficient documentation

## 2015-11-17 DIAGNOSIS — I471 Supraventricular tachycardia: Secondary | ICD-10-CM | POA: Insufficient documentation

## 2015-11-19 ENCOUNTER — Ambulatory Visit (INDEPENDENT_AMBULATORY_CARE_PROVIDER_SITE_OTHER): Payer: Medicare PPO | Admitting: Cardiology

## 2015-11-19 ENCOUNTER — Encounter: Payer: Self-pay | Admitting: Cardiology

## 2015-11-19 VITALS — BP 136/70 | HR 60 | Ht 68.0 in | Wt 145.4 lb

## 2015-11-19 DIAGNOSIS — I471 Supraventricular tachycardia, unspecified: Secondary | ICD-10-CM

## 2015-11-19 DIAGNOSIS — I252 Old myocardial infarction: Secondary | ICD-10-CM | POA: Diagnosis not present

## 2015-11-19 DIAGNOSIS — Q231 Congenital insufficiency of aortic valve: Secondary | ICD-10-CM | POA: Diagnosis not present

## 2015-11-19 DIAGNOSIS — I350 Nonrheumatic aortic (valve) stenosis: Secondary | ICD-10-CM

## 2015-11-19 DIAGNOSIS — Q2381 Bicuspid aortic valve: Secondary | ICD-10-CM

## 2015-11-19 NOTE — Progress Notes (Signed)
Seth Simpson. [address], [address] [place], [place]  [address] Phone: [phone] Fax:  [phone]  Date:  11/19/2015   ID:  Seth Simpson, DOB 08-18-1931, MRN [id number]  PCP:  Henrine Screws, MD   History of Present Illness: Seth Simpson is a 81 y.o. male with coronary artery disease status post bypass surgery in April of 2009, prior myocardial infarction/inferior, history of anemia, left anterior fascicular block, PVCs, minimal aortic stenosis with possible bicuspid valve with hyperlipidemia here for followup.  Admitted on 11/20/14 with incessant SVT. Wide-complex tachycardia at his primary physician's all this. Terminated rapid rhythm with carotid sinus massage as well as adenosine as well as diltiazem. Tachycardia then returned. He was placed on IV amiodarone and considered catheter ablation. Dr. Caryl Comes and Dr. Rayann Heman both saw him. Preferred medical approach at this time. Oral amiodarone. Stable. Saw Richardson Dopp on 12/18/14. He has had pulmonary function studies with primary physician.  His PCP reduced his Crestor frequency.  Patient denies problems with prior to reduction and is doing well with three times weekly Crestor.  Denies any syncope, shortness of breath, chest pain, palpitations, PND, orthopnea.  He occasionally gets lightheaded if he stands too quickly.  He is less active outside of the home because he is taking care of his wife with advancing dementia. Seeing Dr. Inda Merlin soon. He is able to do his BADLs and IADLs without cardiac symptoms.   Studies:  - LHC (4/09): LAD 50-70%, ostial/proximal D1 70-75%, proximal RI 95%, CFX subtotally occluded, RCA 95% >> CABG - Echo (5/15): Mild LVH, EF 60-65%, normal wall motion, mild AS (mean 12 mmHg) , mild MR   Recent Labs: 11/20/2014: ALT 13; TSH 2.740 11/21/2014: BUN 19; Creatinine 1.04; Hemoglobin 11.8*; LDL (calc) 44; Potassium 4.4; Sodium 141  05/2015 -  Wt Readings from Last 3 Encounters:  11/19/15 145 lb 6.4 oz  (65.953 kg)  04/22/15 145 lb 3.2 oz (65.862 kg)  03/04/15 140 lb (63.504 kg)     Past Medical History  Diagnosis Date  . Coronary artery disease     a. s/p CABG 03/2008 - presentation acute IMI, medically aborted.  . Hypertension   . Heart murmur   . Anemia   . GERD (gastroesophageal reflux disease)   . Hypercholesteremia   . Erectile dysfunction   . Depression   . Decreased libido   . Orchitis and epididymitis   . Prostatitis   . BPH (benign prostatic hyperplasia)   . Bradycardia   . Syncope   . Supraventricular tachycardia ([place])     a. Started on amio 11/2014.  Marland Kitchen Bicuspid aortic valve   . Myocardial infarction [facility]) ?2009  . Type II diabetes mellitus ([place])   . Sinus headache   . Arthritis     "right hand" (11/20/2014)  . Rheumatoid arthritis ([place]) 10/02/2013    Dr. Lenna Gilford, methotrexate  . Aortic stenosis     a. Echo 04/2014: mild AS.   Marland Kitchen PVC's (premature ventricular contractions)   . Left anterior fascicular block   . ASVD (arteriosclerotic vascular disease)     , s/p CAGB-presentation acute IMI, medically aborted-Dr. Rodell Perna   . LAFB (left anterior fascicular block) 04/22/2015  . Mild aortic stenosis 10/22/2014  . Old MI (myocardial infarction)   . PSVT (paroxysmal supraventricular tachycardia) ([place]) 04/22/2015  . PVC (premature ventricular contraction) 10/22/2014  . SVT (supraventricular tachycardia) ([place]) 11/20/2014  . High risk medication use 04/22/2015    Past Surgical  History  Procedure Laterality Date  . Total knee arthroplasty Bilateral   . Replacement total knee bilateral  2008  . Colonoscopy  04/06/2012    Procedure: COLONOSCOPY;  Surgeon: Garlan Fair, MD;  Location: [facility] ENDOSCOPY;  Service: Endoscopy;  Laterality: N/A;  . Esophagogastroduodenoscopy  04/06/2012    Procedure: ESOPHAGOGASTRODUODENOSCOPY (EGD);  Surgeon: Garlan Fair, MD;  Location: [facility];  Service: Endoscopy;  Laterality: N/A;  . Balloon dilation  04/06/2012    Procedure:  BALLOON DILATION;  Surgeon: Garlan Fair, MD;  Location: [facility] ENDOSCOPY;  Service: Endoscopy;  Laterality: N/A;  . Cataract extraction w/ intraocular lens  implant, bilateral Bilateral 2003  . Finger surgery Left 2000's    "removed BB", 2nd digit  . Appendectomy  1954  . Cholecystectomy open  1978  . Cardiac catheterization  2009  . Coronary artery bypass graft  2009    "CABG X?4"  . Joint replacement    . Coronary angioplasty      Current Outpatient Prescriptions  Medication Sig Dispense Refill  . ACCU-CHEK AVIVA PLUS test strip     . ACCU-CHEK SOFTCLIX LANCETS lancets USE TO TEST BLOOD SUGAR DAILY  12  . allopurinol (ZYLOPRIM) 300 MG tablet Take 300 mg by mouth every evening.     Marland Kitchen amiodarone (PACERONE) 200 MG tablet TAKE 1 TABLET EVERY DAY 90 tablet 0  . amLODipine-benazepril (LOTREL) 5-20 MG per capsule Take 1 capsule by mouth every evening.     Marland Kitchen aspirin 81 MG tablet Take 81 mg by mouth every morning.     . colchicine 0.6 MG tablet Take 0.6 mg by mouth as needed.     . diphenhydrAMINE (BENADRYL) 25 MG tablet Take 25 mg by mouth daily as needed for allergies.    . Diphenhydramine Cit-Aspirin 38.3-500 MG TABS Take 1 tablet by mouth daily as needed (Headache).     . ferrous sulfate 325 (65 FE) MG tablet Take 325 mg by mouth 2 (two) times daily.    . Flaxseed, Linseed, (FLAXSEED OIL MAX STR) 1300 MG CAPS Take 1 tablet by mouth every morning.     . folic acid (FOLVITE) 1 MG tablet Take 1 mg by mouth every morning.     . loratadine (CLARITIN) 10 MG tablet Take 10 mg by mouth every morning.     . metFORMIN (GLUCOPHAGE) 500 MG tablet Take 500 mg by mouth 2 (two) times daily with a meal.     . methotrexate (RHEUMATREX) 2.5 MG tablet Take 15 mg by mouth once a week. Take on Thursdays.  Caution:Chemotherapy. Protect from light.    . metoprolol succinate (TOPROL-XL) 25 MG 24 hr tablet Take 1 tablet (25 mg total) by mouth daily. 30 tablet 6  . Multiple Vitamin (MULTIVITAMIN WITH MINERALS)  TABS tablet Take 1 tablet by mouth every evening.     . naproxen (NAPRELAN) 500 MG 24 hr tablet Take 500 mg by mouth daily as needed (Pain).     Marland Kitchen omeprazole (PRILOSEC) 20 MG capsule Take 20 mg by mouth every morning.     . rosuvastatin (CRESTOR) 10 MG tablet Take 10 mg by mouth 3 (three) times a week. MONDAYS , [place]    . sertraline (ZOLOFT) 100 MG tablet Take 50 mg by mouth every morning.     . sildenafil (VIAGRA) 100 MG tablet Take 100 mg by mouth daily as needed for erectile dysfunction.    Marland Kitchen terazosin (HYTRIN) 5 MG capsule Take 5 mg by mouth  at bedtime.    . triamcinolone (NASACORT ALLERGY 24HR) 55 MCG/ACT AERO nasal inhaler Place 2 sprays into the nose daily as needed (Nasal congestion).     No current facility-administered medications for this visit.    Allergies:    Allergies  Allergen Reactions  . Fruit & Vegetable Daily [Nutritional Supplements]     Papaya  . Hydrochlorothiazide Other (See Comments)    Gout  . Peanut-Containing Drug Products     Peanuts/Pistachios...Marland KitchenMarland KitchenMarland Kitchen Anaphylaxis  . Sucralose Diarrhea  . Codeine Itching and Rash    Social History:  The patient  reports that he quit smoking about 51 years ago. His smoking use included Cigarettes. He has a 15 pack-year smoking history. He has never used smokeless tobacco. He reports that he drinks about 1.2 oz of alcohol per week. He reports that he does not use illicit drugs.    Family History: The patient's family history includes Heart disease in his father.    ROS:  Please see the history of present illness.  No syncope, no bleeding, no palps. Has been dx with RA.       PHYSICAL EXAM: VS:  BP 136/70 mmHg  Pulse 60  Ht 5\' 8"  (1.727 m)  Wt 145 lb 6.4 oz (65.953 kg)  BMI 22.11 kg/m2  SpO2 98% Well nourished, well developed, in no acute distress HEENT: normal Neck: no JVD Cardiac:  normal S1, [id number]; 2/6 systolic murmur with radiation to carotids.  Lungs:  clear to auscultation bilaterally, no  wheezing, rhonchi or rales Abd: soft, nontender, no hepatomegaly Ext: no edema Skin: warm and dry Neuro: no focal abnormalities noted  EKG:  10/22/14-sinus bradycardia rate 58 with frequent PVCs in bigeminy pattern, left axis deviation. Septal infarct pattern.-Prior Sinus rhythm, left anterior fascicular block, nonspecific ST T-wave changes, no significant change from prior EKG. Echocardiogram 2013: Mild aortic stenosis, mean gradient 13 mm mercury      ASSESSMENT AND PLAN:   1. CAD s/p CABG in 2009 - Doing very well.  Compliant with medications.  Continue current regimen  2. RA- followed by Dr. Lenna Gilford. On Methotrexate. Monitoring liver function. He is doing well. Note that he is also on amiodarone. These 2 medications do have potential liver toxicity side effects. Continue closely monitoring.  June 2016 LFTs normal.  Patient due for Rheum labs next week.  I have provided him with rx to take to that lab visit (so he only gets stuck once) to check TSH and LFTs on amiodarone.   3. HTN-was higher in the am hours. Currently under great control. He is not dizzy. 4. Paroxysmal supraventricular tachycardia-currently well controlled with amiodarone. This is after hospitalization in December 2015. He was seen by EP and has opted for noninvasive management. Prior episode of PSVT occurred post endoscopy. Valsalva maneuver.  Continue amiodarone and metop. 5. Anterior fascicular block-currently monitoring, no syncope. 6. Sinoatrial node dysfunction-prior bradycardia. No significant symptoms currently. 7. Frequent PVCs-bigeminy pattern previously seen. Amiodarone has halted PVCs.  Low-dose metoprolol. 8. Old myocardial infarction-inferior wall 9. Coronary artery disease-prior bypass surgery 2009-secondary prevention 10. Mild aortic stenosis-continue to monitor, echocardiogram 2015 with mild aortic stenosis. Murmur appreciated. 11. Hyperlipidemia-Crestor. LDL GOAL 70. Dr. Inda Merlin has been  monitoring. 12. Possible Congenital bicuspid aortic valve 13. 1 year follow-up. Continue to monitor liver function, thyroid.  Signed, Candee Furbish, MD [facility]  11/19/2015 9:35 AM

## 2015-11-19 NOTE — Patient Instructions (Signed)
Medication Instructions:  The current medical regimen is effective;  continue present plan and medications.  Labwork: Please have blood work next week at your Dr's appt.  Follow-Up: Follow up in 1 year with Dr. Marlou Porch.  You will receive a letter in the mail 2 months before you are due.  Please call us when you receive this letter to schedule your follow up appointment.  If you need a refill on your cardiac medications before your next appointment, please call your pharmacy.  Thank you for choosing [facility]!!

## 2015-12-19 ENCOUNTER — Other Ambulatory Visit: Payer: Self-pay | Admitting: Cardiology

## 2016-06-04 ENCOUNTER — Emergency Department (HOSPITAL_COMMUNITY)
Admission: EM | Admit: 2016-06-04 | Discharge: 2016-06-04 | Disposition: A | Discharge status: Home / Self Care | Payer: Medicare PPO | Attending: Emergency Medicine | Admitting: Emergency Medicine

## 2016-06-04 ENCOUNTER — Emergency Department (HOSPITAL_COMMUNITY): Payer: Medicare PPO

## 2016-06-04 ENCOUNTER — Encounter (HOSPITAL_COMMUNITY): Payer: Self-pay | Admitting: *Deleted

## 2016-06-04 DIAGNOSIS — Z7984 Long term (current) use of oral hypoglycemic drugs: Secondary | ICD-10-CM | POA: Insufficient documentation

## 2016-06-04 DIAGNOSIS — N201 Calculus of ureter: Secondary | ICD-10-CM | POA: Diagnosis not present

## 2016-06-04 DIAGNOSIS — I252 Old myocardial infarction: Secondary | ICD-10-CM | POA: Insufficient documentation

## 2016-06-04 DIAGNOSIS — M1990 Unspecified osteoarthritis, unspecified site: Secondary | ICD-10-CM | POA: Insufficient documentation

## 2016-06-04 DIAGNOSIS — I10 Essential (primary) hypertension: Secondary | ICD-10-CM | POA: Diagnosis not present

## 2016-06-04 DIAGNOSIS — E119 Type 2 diabetes mellitus without complications: Secondary | ICD-10-CM | POA: Diagnosis not present

## 2016-06-04 DIAGNOSIS — F329 Major depressive disorder, single episode, unspecified: Secondary | ICD-10-CM | POA: Insufficient documentation

## 2016-06-04 DIAGNOSIS — I2510 Atherosclerotic heart disease of native coronary artery without angina pectoris: Secondary | ICD-10-CM | POA: Diagnosis not present

## 2016-06-04 DIAGNOSIS — Z7982 Long term (current) use of aspirin: Secondary | ICD-10-CM | POA: Diagnosis not present

## 2016-06-04 DIAGNOSIS — R1032 Left lower quadrant pain: Secondary | ICD-10-CM | POA: Diagnosis present

## 2016-06-04 DIAGNOSIS — Z87891 Personal history of nicotine dependence: Secondary | ICD-10-CM | POA: Insufficient documentation

## 2016-06-04 DIAGNOSIS — Z79899 Other long term (current) drug therapy: Secondary | ICD-10-CM | POA: Diagnosis not present

## 2016-06-04 LAB — CBC WITH DIFFERENTIAL/PLATELET
Basophils Absolute: 0 10*3/uL (ref 0.0–0.1)
Basophils Relative: 0 %
EOS PCT: 1 %
Eosinophils Absolute: 0.1 10*3/uL (ref 0.0–0.7)
HCT: 39.3 % (ref 39.0–52.0)
HEMOGLOBIN: 13.1 g/dL (ref 13.0–17.0)
LYMPHS PCT: 6 %
Lymphs Abs: 0.7 10*3/uL (ref 0.7–4.0)
MCH: 34.3 pg — ABNORMAL HIGH (ref 26.0–34.0)
MCHC: 33.3 g/dL (ref 30.0–36.0)
MCV: 102.9 fL — AB (ref 78.0–100.0)
MONO ABS: 0.7 10*3/uL (ref 0.1–1.0)
Monocytes Relative: 6 %
NEUTROS PCT: 87 %
Neutro Abs: 9.4 10*3/uL — ABNORMAL HIGH (ref 1.7–7.7)
PLATELETS: 207 10*3/uL (ref 150–400)
RBC: 3.82 MIL/uL — AB (ref 4.22–5.81)
RDW: 14.8 % (ref 11.5–15.5)
WBC: 10.9 10*3/uL — AB (ref 4.0–10.5)

## 2016-06-04 LAB — URINALYSIS, ROUTINE W REFLEX MICROSCOPIC
Bilirubin Urine: NEGATIVE
Glucose, UA: NEGATIVE mg/dL
Ketones, ur: NEGATIVE mg/dL
Leukocytes, UA: NEGATIVE
Nitrite: NEGATIVE
PROTEIN: NEGATIVE mg/dL
Specific Gravity, Urine: 1.016 (ref 1.005–1.030)
pH: 5 (ref 5.0–8.0)

## 2016-06-04 LAB — BASIC METABOLIC PANEL
ANION GAP: 8 (ref 5–15)
BUN: 30 mg/dL — ABNORMAL HIGH (ref 6–20)
CO2: 20 mmol/L — ABNORMAL LOW (ref 22–32)
Calcium: 10 mg/dL (ref 8.9–10.3)
Chloride: 111 mmol/L (ref 101–111)
Creatinine, Ser: 1.28 mg/dL — ABNORMAL HIGH (ref 0.61–1.24)
GFR calc Af Amer: 58 mL/min — ABNORMAL LOW (ref >60)
GFR, EST NON AFRICAN AMERICAN: 50 mL/min — AB (ref >60)
Glucose, Bld: 184 mg/dL — ABNORMAL HIGH (ref 65–99)
Potassium: 4.3 mmol/L (ref 3.5–5.1)
Sodium: 139 mmol/L (ref 135–145)

## 2016-06-04 LAB — URINE MICROSCOPIC-ADD ON

## 2016-06-04 MED ORDER — TAMSULOSIN HCL 0.4 MG PO CAPS: 0.4000 mg | ORAL_CAPSULE | Freq: Every day | ORAL | Status: DC

## 2016-06-04 MED ORDER — HYDROCODONE-ACETAMINOPHEN 5-325 MG PO TABS: 2.0000 | ORAL_TABLET | ORAL | Status: DC | PRN

## 2016-06-04 MED ORDER — MORPHINE SULFATE (PF) 4 MG/ML IV SOLN
4.0000 mg | INTRAVENOUS | Status: DC | PRN
  Administered 2016-06-04 (×2): 4 mg via INTRAVENOUS
  Filled 2016-06-04 (×2): qty 1

## 2016-06-04 MED ORDER — TAMSULOSIN HCL 0.4 MG PO CAPS
0.4000 mg | ORAL_CAPSULE | Freq: Once | ORAL | Status: AC
  Administered 2016-06-04: 0.4 mg via ORAL
  Filled 2016-06-04: qty 1

## 2016-06-04 MED ORDER — ONDANSETRON 4 MG PO TBDP: 4.0000 mg | ORAL_TABLET | Freq: Three times a day (TID) | ORAL | Status: DC | PRN

## 2016-06-04 MED ORDER — ONDANSETRON HCL 4 MG/2ML IJ SOLN
4.0000 mg | Freq: Once | INTRAMUSCULAR | Status: AC
  Administered 2016-06-04: 4 mg via INTRAVENOUS
  Filled 2016-06-04: qty 2

## 2016-06-04 MED ORDER — SODIUM CHLORIDE 0.9 % IV SOLN
Freq: Once | INTRAVENOUS | Status: AC
  Administered 2016-06-04: 08:00:00 via INTRAVENOUS

## 2016-06-04 NOTE — ED Notes (Signed)
RN will collect blood collections when she starts IV

## 2016-06-04 NOTE — Discharge Instructions (Signed)
You have several kidney stones passing on both sides. Call [facility] today for an appointment for Monday. Your kidney function is slightly elevated today. It should be rechecked within the next 7-10 days with urology or your primary care physician. Push fluids and stay hydrated. Flomax, one tablet daily. You have already been given this dose today. Take next dose of Flomax tomorrow (Saturday). Return to ER with worsening pain, fever, vomiting, or other new symptoms     Kidney Stones Kidney stones (urolithiasis) are deposits that form inside your kidneys. The intense pain is caused by the stone moving through the urinary tract. When the stone moves, the ureter goes into spasm around the stone. The stone is usually passed in the urine.  CAUSES   A disorder that makes certain neck glands produce too much parathyroid hormone (primary hyperparathyroidism).  A buildup of uric acid crystals, similar to gout in your joints.  Narrowing (stricture) of the ureter.  A kidney obstruction present at birth (congenital obstruction).  Previous surgery on the kidney or ureters.  Numerous kidney infections. SYMPTOMS   Feeling sick to your stomach (nauseous).  Throwing up (vomiting).  Blood in the urine (hematuria).  Pain that usually spreads (radiates) to the groin.  Frequency or urgency of urination. DIAGNOSIS   Taking a history and physical exam.  Blood or urine tests.  CT scan.  Occasionally, an examination of the inside of the urinary bladder (cystoscopy) is performed. TREATMENT   Observation.  Increasing your fluid intake.  Extracorporeal shock wave lithotripsy--This is a noninvasive procedure that uses shock waves to break up kidney stones.  Surgery may be needed if you have severe pain or persistent obstruction. There are various surgical procedures. Most of the procedures are performed with the use of small instruments. Only small incisions are needed to accommodate  these instruments, so recovery time is minimized. The size, location, and chemical composition are all important variables that will determine the proper choice of action for you. Talk to your health care provider to better understand your situation so that you will minimize the risk of injury to yourself and your kidney.  HOME CARE INSTRUCTIONS   Drink enough water and fluids to keep your urine clear or pale yellow. This will help you to pass the stone or stone fragments.  Strain all urine through the provided strainer. Keep all particulate matter and stones for your health care provider to see. The stone causing the pain may be as small as a grain of salt. It is very important to use the strainer each and every time you pass your urine. The collection of your stone will allow your health care provider to analyze it and verify that a stone has actually passed. The stone analysis will often identify what you can do to reduce the incidence of recurrences.  Only take over-the-counter or prescription medicines for pain, discomfort, or fever as directed by your health care provider.  Keep all follow-up visits as told by your health care provider. This is important.  Get follow-up X-rays if required. The absence of pain does not always mean that the stone has passed. It may have only stopped moving. If the urine remains completely obstructed, it can cause loss of kidney function or even complete destruction of the kidney. It is your responsibility to make sure X-rays and follow-ups are completed. Ultrasounds of the kidney can show blockages and the status of the kidney. Ultrasounds are not associated with any radiation and can be performed  easily in a matter of minutes.  Make changes to your daily diet as told by your health care provider. You may be told to:  Limit the amount of salt that you eat.  Eat 5 or more servings of fruits and vegetables each day.  Limit the amount of meat, poultry, fish, and  eggs that you eat.  Collect a 24-hour urine sample as told by your health care provider.You may need to collect another urine sample every 6-12 months. SEEK MEDICAL CARE IF:  You experience pain that is progressive and unresponsive to any pain medicine you have been prescribed. SEEK IMMEDIATE MEDICAL CARE IF:   Pain cannot be controlled with the prescribed medicine.  You have a fever or shaking chills.  The severity or intensity of pain increases over 18 hours and is not relieved by pain medicine.  You develop a new onset of abdominal pain.  You feel faint or pass out.  You are unable to urinate.   This information is not intended to replace advice given to you by your health care provider. Make sure you discuss any questions you have with your health care provider.   Document Released: 11/29/2005 Document Revised: 08/20/2015 Document Reviewed: 05/02/2013 Elsevier Interactive Patient Education [employer].

## 2016-06-04 NOTE — ED Notes (Signed)
Patient resting comfortably with family at bedside

## 2016-06-04 NOTE — ED Provider Notes (Signed)
CSN: [id number]     Arrival date & time 06/04/16  0630 History   First MD Initiated Contact with Patient 06/04/16 [phone]     Chief Complaint  Patient presents with  . Nausea    dry heaves, with strong cardiac hx, and LLQ abd pain  . Abdominal Pain    LLQ sharpe pain 10/10     HPI  Seth Simpson presents for evaluation of vomiting and left lower quadrant pain. He states that he's had some blood in his urine recently. Seen by primary care physician. Referred to Dr. Vikki Ports at [facility], In urology. Had a CT scan 10 days ago. He does not know the result.  He has not been having any pain whatsoever. He was awakened abruptly this morning at 04 30 with left lower quadrant abdominal pain and vomiting. He states he did not feel nauseated with the pain was intense. His pain with ambulating and touching on his left lower quadrant. He's had a previous colonoscopy does not know if he has diverticuli. His never had diverticulitis. No back pain. Pain is constant. Is not colicky and does not waver.   Past Medical History  Diagnosis Date  . Coronary artery disease     a. s/p CABG 03/2008 - presentation acute IMI, medically aborted.  . Hypertension   . Heart murmur   . Anemia   . GERD (gastroesophageal reflux disease)   . Hypercholesteremia   . Erectile dysfunction   . Depression   . Decreased libido   . Orchitis and epididymitis   . Prostatitis   . BPH (benign prostatic hyperplasia)   . Bradycardia   . Syncope   . Supraventricular tachycardia ([place])     a. Started on amio 11/2014.  Marland Kitchen Bicuspid aortic valve   . Myocardial infarction [facility]) ?2009  . Type II diabetes mellitus ([place])   . Sinus headache   . Arthritis     "right hand" (11/20/2014)  . Rheumatoid arthritis ([place]) 10/02/2013    Dr. Lenna Gilford, methotrexate  . Aortic stenosis     a. Echo 04/2014: mild AS.   Marland Kitchen PVC's (premature ventricular contractions)   . Left anterior fascicular block   . ASVD (arteriosclerotic vascular disease)     , s/p  CAGB-presentation acute IMI, medically aborted-Dr. Rodell Perna   . LAFB (left anterior fascicular block) 04/22/2015  . Mild aortic stenosis 10/22/2014  . Old MI (myocardial infarction)   . PSVT (paroxysmal supraventricular tachycardia) ([place]) 04/22/2015  . PVC (premature ventricular contraction) 10/22/2014  . SVT (supraventricular tachycardia) ([place]) 11/20/2014  . High risk medication use 04/22/2015   Past Surgical History  Procedure Laterality Date  . Total knee arthroplasty Bilateral   . Replacement total knee bilateral  2008  . Colonoscopy  04/06/2012    Procedure: COLONOSCOPY;  Surgeon: Garlan Fair, MD;  Location: [facility] ENDOSCOPY;  Service: Endoscopy;  Laterality: N/A;  . Esophagogastroduodenoscopy  04/06/2012    Procedure: ESOPHAGOGASTRODUODENOSCOPY (EGD);  Surgeon: Garlan Fair, MD;  Location: [facility];  Service: Endoscopy;  Laterality: N/A;  . Balloon dilation  04/06/2012    Procedure: BALLOON DILATION;  Surgeon: Garlan Fair, MD;  Location: [facility] ENDOSCOPY;  Service: Endoscopy;  Laterality: N/A;  . Cataract extraction w/ intraocular lens  implant, bilateral Bilateral 2003  . Finger surgery Left 2000's    "removed BB", 2nd digit  . Appendectomy  1954  . Cholecystectomy open  1978  . Cardiac catheterization  2009  . Coronary artery bypass graft  2009    "  CABG X?4"  . Joint replacement    . Coronary angioplasty     Family History  Problem Relation Age of Onset  . Heart disease Father    Social History  Substance Use Topics  . Smoking status: Former Smoker -- 1.00 packs/day for 15 years    Types: Cigarettes    Quit date: 09/29/1964  . Smokeless tobacco: Never Used  . Alcohol Use: 1.2 oz/week    2 Glasses of wine per week    Review of Systems  Constitutional: Negative for fever, chills, diaphoresis, appetite change and fatigue.  HENT: Negative for mouth sores, sore throat and trouble swallowing.   Eyes: Negative for visual disturbance.  Respiratory: Negative for  cough, chest tightness, shortness of breath and wheezing.   Cardiovascular: Negative for chest pain.  Gastrointestinal: Positive for vomiting and abdominal pain. Negative for nausea, diarrhea and abdominal distention.  Endocrine: Negative for polydipsia, polyphagia and polyuria.  Genitourinary: Positive for hematuria. Negative for dysuria and frequency.  Musculoskeletal: Negative for gait problem.  Skin: Negative for color change, pallor and rash.  Neurological: Negative for dizziness, syncope, light-headedness and headaches.  Hematological: Does not bruise/bleed easily.  Psychiatric/Behavioral: Negative for behavioral problems and confusion.      Allergies  Peanut-containing drug products; Hydrochlorothiazide; Pistachio nut (diagnostic); Sucralose; Codeine; and Fruit & vegetable daily  Home Medications   Prior to Admission medications   Medication Sig Start Date End Date Taking? Authorizing Provider  ACCU-CHEK AVIVA PLUS test strip  11/10/15  Yes Historical Provider, MD  ACCU-CHEK SOFTCLIX LANCETS lancets USE TO TEST BLOOD SUGAR DAILY 08/15/15  Yes Historical Provider, MD  allopurinol (ZYLOPRIM) 300 MG tablet Take 300 mg by mouth every evening.    Yes Historical Provider, MD  amiodarone (PACERONE) 200 MG tablet Take 1 tablet (200 mg total) by mouth daily. 12/19/15  Yes Jerline Pain, MD  amLODipine-benazepril (LOTREL) 5-20 MG per capsule Take 1 capsule by mouth every evening.    Yes Historical Provider, MD  aspirin 81 MG tablet Take 81 mg by mouth every morning.    Yes Historical Provider, MD  CHERRY CONCENTRATE PO Take 1 tablet by mouth daily. OTC-Tart Cherry Extract tablet   Yes Historical Provider, MD  colchicine 0.6 MG tablet Take 0.6 mg by mouth daily as needed (gout).    Yes Historical Provider, MD  diphenhydrAMINE (BENADRYL) 25 MG tablet Take 25 mg by mouth daily as needed for allergies.   Yes Historical Provider, MD  ferrous sulfate 325 (65 FE) MG tablet Take 325 mg by mouth 2  (two) times daily.   Yes Historical Provider, MD  finasteride (PROSCAR) 5 MG tablet Take 5 mg by mouth daily. 03/24/16  Yes Historical Provider, MD  Flaxseed, Linseed, (FLAXSEED OIL MAX STR) 1300 MG CAPS Take 1 tablet by mouth every morning.    Yes Historical Provider, MD  folic acid (FOLVITE) 1 MG tablet Take 1 mg by mouth every morning.    Yes Historical Provider, MD  loratadine (CLARITIN) 10 MG tablet Take 10 mg by mouth every morning.    Yes Historical Provider, MD  metFORMIN (GLUCOPHAGE) 500 MG tablet Take 500 mg by mouth 2 (two) times daily with a meal.  11/04/14  Yes Historical Provider, MD  methotrexate (RHEUMATREX) 2.5 MG tablet Take 15 mg by mouth once a week. Take on Fridays.  Caution:Chemotherapy. Protect from light.   Yes Historical Provider, MD  metoprolol succinate (TOPROL-XL) 25 MG 24 hr tablet Take 1 tablet (25 mg total) by  mouth daily. 05/10/14  Yes Jerline Pain, MD  Multiple Vitamin (MULTIVITAMIN WITH MINERALS) TABS tablet Take 1 tablet by mouth every evening.    Yes Historical Provider, MD  naproxen (NAPRELAN) 500 MG 24 hr tablet Take 500 mg by mouth daily as needed (Pain).    Yes Historical Provider, MD  omeprazole (PRILOSEC OTC) 20 MG tablet Take 10 mg by mouth daily.   Yes Historical Provider, MD  rosuvastatin (CRESTOR) 10 MG tablet Take 10 mg by mouth 3 (three) times a week. MONDAYS , [place] Historical Provider, MD  sertraline (ZOLOFT) 100 MG tablet Take 50 mg by mouth every morning.  10/05/14  Yes Historical Provider, MD  sildenafil (VIAGRA) 100 MG tablet Take 100 mg by mouth daily as needed for erectile dysfunction.   Yes Historical Provider, MD  terazosin (HYTRIN) 5 MG capsule Take 5 mg by mouth at bedtime.   Yes Historical Provider, MD  triamcinolone (NASACORT ALLERGY 24HR) 55 MCG/ACT AERO nasal inhaler Place 2 sprays into the nose daily as needed (Nasal congestion).   Yes Historical Provider, MD  HYDROcodone-acetaminophen (NORCO/VICODIN) 5-325 MG  tablet Take 2 tablets by mouth every 4 (four) hours as needed. 06/04/16   Tanna Furry, MD  ondansetron (ZOFRAN ODT) 4 MG disintegrating tablet Take 1 tablet (4 mg total) by mouth every 8 (eight) hours as needed for nausea. 06/04/16   Tanna Furry, MD  tamsulosin (FLOMAX) 0.4 MG CAPS capsule Take 1 capsule (0.4 mg total) by mouth daily. 06/04/16   Tanna Furry, MD   BP 137/64 mmHg  Pulse 55  Temp(Src) 97.7 F (36.5 C) (Oral)  Resp 17  Ht 5\' 9"  (1.753 m)  Wt 140 lb (63.504 kg)  BMI 20.67 kg/m2  SpO2 95% Physical Exam  Constitutional: He is oriented to person, place, and time. He appears well-developed and well-nourished. No distress.  HENT:  Head: Normocephalic.  Eyes: Conjunctivae are normal. Pupils are equal, round, and reactive to light. No scleral icterus.  Neck: Normal range of motion. Neck supple. No thyromegaly present.  Cardiovascular: Normal rate and regular rhythm.  Exam reveals no gallop and no friction rub.   No murmur heard. Pulmonary/Chest: Effort normal and breath sounds normal. No respiratory distress. He has no wheezes. He has no rales.  Abdominal: Soft. Bowel sounds are normal. He exhibits no distension. There is no tenderness. There is no rebound.    Musculoskeletal: Normal range of motion.  Neurological: He is alert and oriented to person, place, and time.  Skin: Skin is warm and dry. No rash noted.  Psychiatric: He has a normal mood and affect. His behavior is normal.    ED Course  Procedures (including critical care time) Labs Review Labs Reviewed  CBC WITH DIFFERENTIAL/PLATELET - Abnormal; Notable for the following:    WBC 10.9 (*)    RBC 3.82 (*)    MCV 102.9 (*)    MCH 34.3 (*)    Neutro Abs 9.4 (*)    All other components within normal limits  BASIC METABOLIC PANEL - Abnormal; Notable for the following:    CO2 20 (*)    Glucose, Bld 184 (*)    BUN 30 (*)    Creatinine, Ser 1.28 (*)    GFR calc non Af Amer 50 (*)    GFR calc Af Amer 58 (*)    All other  components within normal limits  URINALYSIS, ROUTINE W REFLEX MICROSCOPIC (NOT AT [facility]) - Abnormal; Notable for the following:  Hgb urine dipstick LARGE (*)    All other components within normal limits  URINE MICROSCOPIC-ADD ON - Abnormal; Notable for the following:    Squamous Epithelial / LPF 0-5 (*)    Bacteria, UA FEW (*)    All other components within normal limits    Imaging Review Ct Renal Stone Study  06/04/2016  CLINICAL DATA:  Left lower quadrant pain. Nausea. Symptoms beginning early this morning. EXAM: CT ABDOMEN AND PELVIS WITHOUT CONTRAST TECHNIQUE: Multidetector CT imaging of the abdomen and pelvis was performed following the standard protocol without IV contrast. COMPARISON:  05/21/2016 FINDINGS: Lung bases: Interstitial thickening. Mild dependent atelectasis. No pleural effusion. Heart normal in size. Hepatobiliary: Unremarkable liver. Gallbladder surgically absent. No bile duct dilation. Spleen, pancreas, adrenal glands:  Unremarkable. Kidneys, ureters, bladder: There are bilateral ureteral stones. On the left there is moderate hydronephrosis and hydroureter. A string of stones lie along the lateral pelvic aspect of the distal left ureter measuring 11 mm in greatest dimension. There is a 6 mm stone in the distal ureter at the ureterovesicular junction. On the right, there are 9 mm and 6 mm proximal ureteral stones. There is minor prominence of the right renal pelvis. Bilateral nonobstructing intrarenal stones are noted. There are bilateral low attenuation masses consistent with cysts, largest from the anterolateral upper pole the left kidney measuring 8.3 cm. The enlarged prostate gland bulges against the posterior inferior bladder base. There is a group of stones in the right posterior bladder adjacent to the right ureteral orifice measuring 1 cm in greatest dimension. These are stable. Vascular: Diffuse atherosclerotic calcifications noted along the abdominal aorta and iliac and  branch vessels, stable. Lymph nodes:  No pathologically enlarged lymph nodes. Ascites:  None. Gastrointestinal: Small hiatal hernia. Small bowel is unremarkable. There are numerous left colon diverticula mostly along the sigmoid colon. No diverticulitis. Appendix not visualized. No evidence appendicitis. Musculoskeletal: Significant degenerative changes noted throughout the lumbar spine. Chronic avascular necrosis of the superior right femoral head. No osteoblastic or osteolytic lesions. IMPRESSION: 1. Bilateral ureteral stones as detailed above, causing left moderate hydronephrosis but only minor dilation of the right renal pelvis. 2. Bilateral nonobstructing intrarenal stones. Bilateral renal cysts. 3. Lung base interstitial thickening. Mild interstitial edema from chronic congestive heart failure should be considered. This is stable from the prior exam. 4. Stable bladder stones. 5. Numerous sigmoid colon diverticula.  No diverticulitis. 6. Aortic atherosclerosis. 7. Prostatic hypertrophy. 8. Significant degenerative changes of the lumbar spine. Avascular necrosis of the right femoral head, chronic. Electronically Signed   By: Lajean Manes M.D.   On: 06/04/2016 08:49   I have personally reviewed and evaluated these images and lab results as part of my medical decision-making.   EKG Interpretation None      MDM   Final diagnoses:  Ureteral stone    With history of hematuria could definitely be stone. However this tenderness I have ordered a repeat CT scan to ensure that this is not diverticulitis. We'll reevaluate. Plan IV fluids anti-medics pain medications labs urine and reassessment  11:05:  CT scan shows left UVJ 11 mm stone. Multiple moderate sized stacked stones in the proximal right ureter. He has several stones in the bladder. He hasn't bladder diverticulum. Creatinine slightly elevated. I discussed the constellation of findings with the patient. I also discussed case with Dr. Preston Fleeting of [facility].  Patient is symptom free after one dose of med pain medication. After Valsalva felt that he could be  discharged with close follow-up. He has that the patient call today for a Monday appointment. I discussed this at length with the patient. He was given a dose of Flomax here. Cautioned about the orthostatic effects of the Flomax. Continue to hydrate himself. Return here with new or worsening symptoms. Follow up with [facility] on Monday   Tanna Furry, MD 06/04/16 [phone]

## 2016-06-04 NOTE — ED Notes (Signed)
Report from L. Fant, RN at 7 am.  Patient not yet assessed by EDP at that time.  IV in place from EMS.  On EDP assessment, orders for labs and pain/nausea medicine placed.  Patient assessed.  He still c/o nausea and flank pain.  Denies chest pain and SOB, in no distress.

## 2016-06-04 NOTE — ED Notes (Signed)
Pt comes from home with Nausea that woke him up about 0430. He has been dry heaving but no actual emesis. C/o pain in LLQ.  Elevated BP.  Pt has Afib and strong cardiac hx per EMS.  Pt did recently have blood in urine and had a CT and is awaiting results from urologist.

## 2016-06-04 NOTE — ED Notes (Signed)
Bed: WA04 Expected date:  Expected time:  Means of arrival:  Comments: Ems 

## 2016-06-07 ENCOUNTER — Telehealth: Payer: Self-pay | Admitting: Cardiology

## 2016-06-07 NOTE — Telephone Encounter (Signed)
He may hold his aspirin for 7 days prior to his bladder stone removal surgery. I do believe that it would be fairly low risk to do this.  He may proceed with surgery. He has not had any anginal symptoms.  Candee Furbish, MD

## 2016-06-07 NOTE — Telephone Encounter (Signed)
Will forward to Dr Skains for review and recommendations 

## 2016-06-07 NOTE — Telephone Encounter (Signed)
Request for surgical clearance:  1. What type of surgery is being performed? Bladder stone removal   2. When is this surgery scheduled? 2 weeks from now   3. Are there any medications that need to be held prior to surgery and how long?Can he hold his aspirin and how long?  4. Name of physician performing surgery? Dr Lissa Merlin   5. What is your office phone and fax number? [phone] and fax [phone] Att:Pam  6.

## 2016-06-07 NOTE — Telephone Encounter (Signed)
Printed and taken to MR to be faxed as requested.

## 2016-06-08 ENCOUNTER — Encounter (HOSPITAL_BASED_OUTPATIENT_CLINIC_OR_DEPARTMENT_OTHER): Payer: Self-pay | Admitting: *Deleted

## 2016-06-08 ENCOUNTER — Other Ambulatory Visit: Payer: Self-pay | Admitting: Urology

## 2016-06-09 ENCOUNTER — Encounter (HOSPITAL_BASED_OUTPATIENT_CLINIC_OR_DEPARTMENT_OTHER): Payer: Self-pay | Admitting: *Deleted

## 2016-06-09 NOTE — Progress Notes (Signed)
NPO AFTER MN.  ARRIVE AT 0600.  NEEDS EKG.  CURRENT LAB RESULTS IN CHART AND EPIC.  WILL TAKE AM MEDS DOS W/  SIPS OF WATER WITH EXCEPTION NO METFORMIN.

## 2016-06-16 NOTE — H&P (Signed)
: I have ureteral stone.  HPI: Seth Simpson is a 82 year-old male established patient who is here for ureteral stone.  The problem is on both sides. He first stated noticing pain on 06/04/2000. He is currently having flank pain, nausea, and vomiting. He denies having back pain, groin pain, fever, and chills. Pain is occuring on the left side. He has not caught a stone in his urine strainer since his symptoms began.   He has never had surgical treatment for calculi in the past.   He had been seen by Dr. Matilde Sprang a couple of weeks ago for microhematuria but had no pain at that time. He was seen in the ER on 6/23 with LLQ pain and nausea and a CT showed 6 and 72mm right proximal stones that had been in the RLP a couple of weeks before and an 75mm LUVJ stone with additional more proximal stones in the distal ureter.Marland Kitchen He also has a very large prostate with a spiculated bladder stone and smaller stones. He is currently symptom free other than some low back pain but has passed no stones.      CC: I have bladder stones.  HPI:   Multiple stones on CT with the largest about 46mm and spiculated.      CC: I have symptoms of an enlarged prostate.  HPI: He first noticed the symptoms approximately 05/14/2015. His symptoms have gotten worse over the last year. He has been treated with Hytrin.   He has a history of BPH with BOO and has been on terazosin and finasteride for many years. He has some urgency with rare UUI. He has a reduced stream but feels emptying. he has no hematuria at this time.      ALLERGIES: Papaya Peanuts Pistachios    MEDICATIONS: Aspirin 81 mg tablet, chewable 1 tablet PO Daily  Finasteride 5 mg tablet 1 tablet PO Daily  Metoprolol Succinate 25 mg tablet, extended release 24 hr 1 tablet PO Daily  Omeprazole 20 mg capsule,delayed release 1 capsule PO Daily  Terazosin Hcl 5 mg capsule 1 capsule PO Daily  Viagra 100 mg tablet 1 tablet PO Daily  Cherry Concentrate 1 PO Daily   Colchicine 0.6 mg capsule 1 capsule PO Daily  Diphenhydramine Hcl 25 mg capsule 1 capsule PO Daily  Ferrous Sulfate 325 mg (65 mg iron) tablet 1 tablet PO Daily  Flaxseed Oil 1 PO Daily  Folic Acid 1 mg tablet 1 tablet PO Daily  Loratadine 10 mg tablet 1 tablet PO Daily  Methotrexate 2.5 mg tablet 1 tablet PO Daily  Multivitamin 1 PO Daily  Naproxen 500 mg tablet 1 tablet PO Daily  Nasacort 55 mcg aerosol, spray 1 PO Daily  Rosuvastatin Calcium 10 mg tablet 1 tablet PO Daily  Sertraline Hcl 100 mg tablet 1 tablet PO Daily     GU PSH: Locm 300-399Mg /Ml Iodine,1Ml - 05/21/2016    NON-GU PSH: Coronary Artery Bypass Grafting - about 2009        GU PMH: Other microscopic hematuria, Hematuria, microscopic - 05/05/2016 Poor urinary stream, Weak urinary stream - 05/05/2016 Kidney Stone    NON-GU PMH: Encounter for general adult medical examination without abnormal findings, Encounter for preventive health examination - 05/05/2016 Essential (primary) hypertension Gout, unspecified Rheumatoid arthritis, unspecified Type 2 diabetes mellitus without complications    FAMILY HISTORY: Arthritis - Mother Heart Attack - Father   SOCIAL HISTORY: Marital Status: Married Current Smoking Status: Patient does not smoke anymore.  Does not drink  caffeine. Has not had a blood transfusion. Patient's occupation is/was retired.    REVIEW OF SYSTEMS:     GU Review Male:  Patient reports hard to postpone urination, get up at night to urinate, and erection problems. Patient denies frequent urination, burning/ pain with urination, leakage of urine, stream starts and stops, trouble starting your stream, have to strain to urinate , and penile pain.    Gastrointestinal (Upper):  Patient denies nausea, vomiting, and indigestion/ heartburn.    Gastrointestinal (Lower):  Patient denies diarrhea and constipation.    Constitutional:  Patient denies fever, night sweats, weight loss, and fatigue.    Skin:  Patient  denies skin rash/ lesion and itching.    Eyes:  Patient denies blurred vision and double vision.    Ears/ Nose/ Throat:  Patient denies sore throat and sinus problems.    Hematologic/Lymphatic:  Patient reports easy bruising. Patient denies swollen glands.    Cardiovascular:  Patient denies leg swelling and chest pains.    Respiratory:  Patient denies cough and shortness of breath.    Endocrine:  Patient denies excessive thirst.    Musculoskeletal:  Patient reports back pain and joint pain.     Neurological:  Patient denies headaches and dizziness.    Psychologic:  Patient denies depression and anxiety.    VITAL SIGNS:       06/07/2016 09:05 AM     Weight 140 lb / 63.5 kg     Height 70 in / 178 cm     BP 111/67 mmHg     Pulse 61 /min     Temperature 98.3 F / 37 C     BMI 20.1 kg/m     MULTI-SYSTEM PHYSICAL EXAMINATION:      Constitutional: Well-nourished. No physical deformities. Normally developed. Good grooming.     Respiratory: No labored breathing, no use of accessory muscles.      Cardiovascular: Heart murmur. Normal temperature, no swelling.      Gastrointestinal: No mass, no tenderness, no rigidity, non obese abdomen.            PAST DATA REVIEWED:   Source Of History:  Patient  Lab Test Review:  BMP  Records Review:  Previous Doctor Records, Previous Hospital Records  Urine Test Review:  Urinalysis  X-Ray Review: C.T. Abdomen/Pelvis: Reviewed Films. Reviewed Report. I have reviewed both scans from 6/9 and 6/23.    Notes:  He had a mild elevation in the Cr to 1.28 on 6/23.    PROCEDURES:    KUB - 74000  A single view of the abdomen is obtained. He has a 23mm RLP stone and 2 right proximal stones with the largest at 50mm. There are tiny left renal stones. The previously noted left distal stones are not clearly seen. There is a 50mm jack stone in the right bladder area. He has marked lumbar degenerative disease and some vascular calcifications but gas or soft tissue  abnormalities. There are clips in the RUQ and a phlebolith in the left pelvis.           Urinalysis w/Scope - 81001  Dipstick Dipstick Cont'd Micro  Specimen: Voided Bilirubin: Neg WBC/hpf: 0-5/hpf  Color: Yellow Ketones: Neg RBC/hpf: 40-60/hpf  Appearance: Clear Blood: 3+ Bacteria: NS (Not Seen)  Specific Gravity: 1.020 Protein: Trace Cystals: NS (Not Seen)  pH: 5.5 Urobilinogen: 0.2 Casts: NS (Not Seen)  Glucose: Neg Nitrites: Neg Trichomonas: Not Present   Leukocyte Esterase: Neg Mucous: Not Present  Epithelial Cells: 0-5/hpf    Yeast: NS (Not Seen)    Sperm: Not Present    ASSESSMENT:     ICD-10 Details  1 GU:  Calculus Ureter - N20.1 Bilateral, He has left distal stones and right proximal stones with minimal symptoms.   2  Bladder Stone - N21.0 He has multiple bladder stones.   3  BPH w/LUTS - N40.1 Stable - He has a large prostate but is voiding well on medical therapy.   PLAN:   Orders  X-Rays: KUB  Schedule  Return Visit: ASAP - Schedule Surgery  Return Notes: He has bladder and bilateral ureteral stone and needs cystolithalopaxy and possible bilateral ureteroscopy.   Document  Letter(s):  Created for Patient: Clinical Summary   Notes:  I have reviewed the risks of the recommended surgery including bleeding, infection, injury to the urethra, bladder and ureters with possible stricturing, need for stents or secondary procedures, thrombotic events and anesthetic complications.

## 2016-06-17 ENCOUNTER — Other Ambulatory Visit: Payer: Self-pay

## 2016-06-17 ENCOUNTER — Encounter (HOSPITAL_BASED_OUTPATIENT_CLINIC_OR_DEPARTMENT_OTHER)
Admission: RE | Disposition: A | Discharge status: Home / Self Care | Payer: Self-pay | Source: Ambulatory Visit | Attending: Urology

## 2016-06-17 ENCOUNTER — Ambulatory Visit (HOSPITAL_BASED_OUTPATIENT_CLINIC_OR_DEPARTMENT_OTHER): Payer: Medicare PPO | Admitting: Anesthesiology

## 2016-06-17 ENCOUNTER — Encounter (HOSPITAL_BASED_OUTPATIENT_CLINIC_OR_DEPARTMENT_OTHER): Payer: Self-pay | Admitting: *Deleted

## 2016-06-17 ENCOUNTER — Ambulatory Visit (HOSPITAL_BASED_OUTPATIENT_CLINIC_OR_DEPARTMENT_OTHER)
Admission: RE | Admit: 2016-06-17 | Discharge: 2016-06-17 | Disposition: A | Discharge status: Home / Self Care | Payer: Medicare PPO | Source: Ambulatory Visit | Attending: Urology | Admitting: Urology

## 2016-06-17 DIAGNOSIS — Z7982 Long term (current) use of aspirin: Secondary | ICD-10-CM | POA: Insufficient documentation

## 2016-06-17 DIAGNOSIS — N323 Diverticulum of bladder: Secondary | ICD-10-CM | POA: Diagnosis not present

## 2016-06-17 DIAGNOSIS — I10 Essential (primary) hypertension: Secondary | ICD-10-CM | POA: Insufficient documentation

## 2016-06-17 DIAGNOSIS — N3289 Other specified disorders of bladder: Secondary | ICD-10-CM | POA: Insufficient documentation

## 2016-06-17 DIAGNOSIS — Z7951 Long term (current) use of inhaled steroids: Secondary | ICD-10-CM | POA: Diagnosis not present

## 2016-06-17 DIAGNOSIS — N210 Calculus in bladder: Secondary | ICD-10-CM | POA: Insufficient documentation

## 2016-06-17 DIAGNOSIS — M109 Gout, unspecified: Secondary | ICD-10-CM | POA: Diagnosis not present

## 2016-06-17 DIAGNOSIS — N201 Calculus of ureter: Secondary | ICD-10-CM | POA: Diagnosis present

## 2016-06-17 DIAGNOSIS — N401 Enlarged prostate with lower urinary tract symptoms: Secondary | ICD-10-CM | POA: Insufficient documentation

## 2016-06-17 DIAGNOSIS — Z87891 Personal history of nicotine dependence: Secondary | ICD-10-CM | POA: Diagnosis not present

## 2016-06-17 DIAGNOSIS — C672 Malignant neoplasm of lateral wall of bladder: Secondary | ICD-10-CM | POA: Diagnosis not present

## 2016-06-17 DIAGNOSIS — M069 Rheumatoid arthritis, unspecified: Secondary | ICD-10-CM | POA: Diagnosis not present

## 2016-06-17 DIAGNOSIS — Z7984 Long term (current) use of oral hypoglycemic drugs: Secondary | ICD-10-CM | POA: Insufficient documentation

## 2016-06-17 DIAGNOSIS — E119 Type 2 diabetes mellitus without complications: Secondary | ICD-10-CM | POA: Diagnosis not present

## 2016-06-17 DIAGNOSIS — Z79899 Other long term (current) drug therapy: Secondary | ICD-10-CM | POA: Diagnosis not present

## 2016-06-17 DIAGNOSIS — Z951 Presence of aortocoronary bypass graft: Secondary | ICD-10-CM | POA: Diagnosis not present

## 2016-06-17 DIAGNOSIS — N138 Other obstructive and reflux uropathy: Secondary | ICD-10-CM | POA: Diagnosis not present

## 2016-06-17 HISTORY — PX: CYSTOSCOPY WITH BIOPSY: SHX5122

## 2016-06-17 HISTORY — PX: CYSTOSCOPY WITH LITHOLAPAXY: SHX1425

## 2016-06-17 HISTORY — DX: Gout, unspecified: M10.9

## 2016-06-17 HISTORY — PX: CYSTOSCOPY WITH RETROGRADE PYELOGRAM, URETEROSCOPY AND STENT PLACEMENT: SHX5789

## 2016-06-17 HISTORY — DX: Diaphragmatic hernia without obstruction or gangrene: K44.9

## 2016-06-17 HISTORY — DX: Presence of aortocoronary bypass graft: Z95.1

## 2016-06-17 HISTORY — DX: Old myocardial infarction: I25.2

## 2016-06-17 HISTORY — DX: Hyperlipidemia, unspecified: E78.5

## 2016-06-17 HISTORY — PX: HOLMIUM LASER APPLICATION: SHX5852

## 2016-06-17 HISTORY — DX: Personal history of other diseases of male genital organs: Z87.438

## 2016-06-17 HISTORY — DX: Iron deficiency anemia, unspecified: D50.9

## 2016-06-17 HISTORY — DX: Presence of spectacles and contact lenses: Z97.3

## 2016-06-17 HISTORY — DX: Benign prostatic hyperplasia without lower urinary tract symptoms: N40.0

## 2016-06-17 HISTORY — DX: Personal history of other specified conditions: Z87.898

## 2016-06-17 HISTORY — DX: Unspecified symptoms and signs involving the genitourinary system: R39.9

## 2016-06-17 HISTORY — DX: Unspecified osteoarthritis, unspecified site: M19.90

## 2016-06-17 HISTORY — DX: Diverticulosis of large intestine without perforation or abscess without bleeding: K57.30

## 2016-06-17 LAB — GLUCOSE, CAPILLARY: Glucose-Capillary: 181 mg/dL — ABNORMAL HIGH (ref 65–99)

## 2016-06-17 SURGERY — CYSTOSCOPY, WITH BLADDER CALCULUS LITHOLAPAXY
Anesthesia: Monitor Anesthesia Care | Site: Renal

## 2016-06-17 MED ORDER — STERILE WATER FOR IRRIGATION IR SOLN
Status: DC | PRN
  Administered 2016-06-17: 9000 mL

## 2016-06-17 MED ORDER — DEXAMETHASONE SODIUM PHOSPHATE 10 MG/ML IJ SOLN
INTRAMUSCULAR | Status: AC
  Filled 2016-06-17: qty 1

## 2016-06-17 MED ORDER — FENTANYL CITRATE (PF) 100 MCG/2ML IJ SOLN
INTRAMUSCULAR | Status: AC
  Filled 2016-06-17: qty 2

## 2016-06-17 MED ORDER — LIDOCAINE HCL (CARDIAC) 20 MG/ML IV SOLN
INTRAVENOUS | Status: DC | PRN
  Administered 2016-06-17: 40 mg via INTRAVENOUS

## 2016-06-17 MED ORDER — ONDANSETRON HCL 4 MG/2ML IJ SOLN
INTRAMUSCULAR | Status: AC
  Filled 2016-06-17: qty 2

## 2016-06-17 MED ORDER — PROPOFOL 10 MG/ML IV BOLUS
INTRAVENOUS | Status: AC
  Filled 2016-06-17: qty 40

## 2016-06-17 MED ORDER — LIDOCAINE HCL 2 % EX GEL
CUTANEOUS | Status: AC
  Filled 2016-06-17: qty 5

## 2016-06-17 MED ORDER — DEXAMETHASONE SODIUM PHOSPHATE 4 MG/ML IJ SOLN
INTRAMUSCULAR | Status: DC | PRN
  Administered 2016-06-17: 10 mg via INTRAVENOUS

## 2016-06-17 MED ORDER — BELLADONNA ALKALOIDS-OPIUM 16.2-60 MG RE SUPP
RECTAL | Status: AC
  Filled 2016-06-17: qty 1

## 2016-06-17 MED ORDER — PROPOFOL 10 MG/ML IV BOLUS
INTRAVENOUS | Status: DC | PRN
  Administered 2016-06-17: 50 mg via INTRAVENOUS
  Administered 2016-06-17: 100 mg via INTRAVENOUS

## 2016-06-17 MED ORDER — FENTANYL CITRATE (PF) 100 MCG/2ML IJ SOLN
25.0000 ug | INTRAMUSCULAR | Status: DC | PRN
  Filled 2016-06-17: qty 1

## 2016-06-17 MED ORDER — ONDANSETRON HCL 4 MG/2ML IJ SOLN
INTRAMUSCULAR | Status: DC | PRN
  Administered 2016-06-17: 4 mg via INTRAVENOUS

## 2016-06-17 MED ORDER — SODIUM CHLORIDE 0.9 % IR SOLN
Status: DC | PRN
  Administered 2016-06-17: 4000 mL

## 2016-06-17 MED ORDER — FENTANYL CITRATE (PF) 100 MCG/2ML IJ SOLN
INTRAMUSCULAR | Status: DC | PRN
  Administered 2016-06-17 (×8): 12.5 ug via INTRAVENOUS

## 2016-06-17 MED ORDER — OXYCODONE HCL 5 MG PO TABS
5.0000 mg | ORAL_TABLET | Freq: Once | ORAL | Status: DC | PRN
  Filled 2016-06-17: qty 1

## 2016-06-17 MED ORDER — ONDANSETRON HCL 4 MG/2ML IJ SOLN
4.0000 mg | Freq: Four times a day (QID) | INTRAMUSCULAR | Status: DC | PRN
  Filled 2016-06-17: qty 2

## 2016-06-17 MED ORDER — CEFAZOLIN SODIUM-DEXTROSE 2-4 GM/100ML-% IV SOLN
2.0000 g | INTRAVENOUS | Status: AC
  Administered 2016-06-17: 2 g via INTRAVENOUS
  Filled 2016-06-17: qty 100

## 2016-06-17 MED ORDER — HYDROCODONE-ACETAMINOPHEN 5-325 MG PO TABS: 1.0000 | ORAL_TABLET | Freq: Four times a day (QID) | ORAL | Status: DC | PRN

## 2016-06-17 MED ORDER — DIATRIZOATE MEGLUMINE 30 % UR SOLN
URETHRAL | Status: DC | PRN
  Administered 2016-06-17: 10 mL

## 2016-06-17 MED ORDER — PHENYLEPHRINE 40 MCG/ML (10ML) SYRINGE FOR IV PUSH (FOR BLOOD PRESSURE SUPPORT)
PREFILLED_SYRINGE | INTRAVENOUS | Status: DC | PRN
  Administered 2016-06-17 (×2): 40 ug via INTRAVENOUS

## 2016-06-17 MED ORDER — EPHEDRINE SULFATE 50 MG/ML IJ SOLN
INTRAMUSCULAR | Status: DC | PRN
  Administered 2016-06-17: 15 mg via INTRAVENOUS
  Administered 2016-06-17: 10 mg via INTRAVENOUS

## 2016-06-17 MED ORDER — OXYCODONE HCL 5 MG/5ML PO SOLN
5.0000 mg | Freq: Once | ORAL | Status: DC | PRN
  Filled 2016-06-17: qty 5

## 2016-06-17 MED ORDER — LIDOCAINE HCL (CARDIAC) 20 MG/ML IV SOLN
INTRAVENOUS | Status: AC
  Filled 2016-06-17: qty 5

## 2016-06-17 MED ORDER — CEFAZOLIN SODIUM-DEXTROSE 2-4 GM/100ML-% IV SOLN
INTRAVENOUS | Status: AC
  Filled 2016-06-17: qty 100

## 2016-06-17 MED ORDER — LACTATED RINGERS IV SOLN
INTRAVENOUS | Status: DC
Start: 2016-06-17 — End: 2016-06-17
  Administered 2016-06-17 (×3): via INTRAVENOUS
  Filled 2016-06-17: qty 1000

## 2016-06-17 SURGICAL SUPPLY — 52 items
ADAPTER CATH URET PLST 4-6FR (CATHETERS) IMPLANT
BAG DRAIN URO-CYSTO SKYTR STRL (DRAIN) ×4 IMPLANT
BAG URINE LEG 19OZ MD ST LTX (BAG) ×4 IMPLANT
BASKET LASER NITINOL 1.9FR (BASKET) IMPLANT
BASKET STNLS GEMINI 4WIRE 3FR (BASKET) IMPLANT
BASKET ZERO TIP NITINOL 2.4FR (BASKET) IMPLANT
CANISTER SUCT LVC 12 LTR MEDI- (MISCELLANEOUS) IMPLANT
CATH FOLEY 2WAY SLVR  5CC 20FR (CATHETERS) ×1
CATH FOLEY 2WAY SLVR 5CC 20FR (CATHETERS) ×3 IMPLANT
CATH INTERMIT  6FR 70CM (CATHETERS) ×4 IMPLANT
CATH URET 5FR 28IN CONE TIP (BALLOONS)
CATH URET 5FR 28IN OPEN ENDED (CATHETERS) IMPLANT
CATH URET 5FR 70CM CONE TIP (BALLOONS) IMPLANT
CLOTH BEACON ORANGE TIMEOUT ST (SAFETY) ×4 IMPLANT
ELECT REM PT RETURN 9FT ADLT (ELECTROSURGICAL) ×4
ELECTRODE REM PT RTRN 9FT ADLT (ELECTROSURGICAL) ×3 IMPLANT
ELECTROHYDROLIC PROBE 9FR (MISCELLANEOUS) IMPLANT
FIBER LASER FLEXIVA 1000 (UROLOGICAL SUPPLIES) ×4 IMPLANT
FIBER LASER FLEXIVA 365 (UROLOGICAL SUPPLIES) IMPLANT
FIBER LASER FLEXIVA 550 (UROLOGICAL SUPPLIES) IMPLANT
FIBER LASER TRAC TIP (UROLOGICAL SUPPLIES) IMPLANT
GLOVE BIOGEL PI IND STRL 7.0 (GLOVE) ×6 IMPLANT
GLOVE BIOGEL PI IND STRL 7.5 (GLOVE) ×6 IMPLANT
GLOVE BIOGEL PI INDICATOR 7.0 (GLOVE) ×2
GLOVE BIOGEL PI INDICATOR 7.5 (GLOVE) ×2
GLOVE SURG SS PI 7.0 STRL IVOR (GLOVE) ×8 IMPLANT
GLOVE SURG SS PI 8.0 STRL IVOR (GLOVE) ×4 IMPLANT
GOWN STRL REUS W/ TWL LRG LVL3 (GOWN DISPOSABLE) ×9 IMPLANT
GOWN STRL REUS W/ TWL XL LVL3 (GOWN DISPOSABLE) ×3 IMPLANT
GOWN STRL REUS W/TWL LRG LVL3 (GOWN DISPOSABLE) ×3
GOWN STRL REUS W/TWL XL LVL3 (GOWN DISPOSABLE) ×1
GOWN XL W/COTTON TOWEL STD (GOWNS) ×4 IMPLANT
GUIDEWIRE 0.038 PTFE COATED (WIRE) IMPLANT
GUIDEWIRE ANG ZIPWIRE 038X150 (WIRE) IMPLANT
GUIDEWIRE STR DUAL SENSOR (WIRE) ×12 IMPLANT
IV NS 1000ML (IV SOLUTION) ×1
IV NS 1000ML BAXH (IV SOLUTION) ×3 IMPLANT
IV NS IRRIG 3000ML ARTHROMATIC (IV SOLUTION) ×4 IMPLANT
KIT BALLIN UROMAX 15FX10 (LABEL) IMPLANT
KIT BALLN UROMAX 15FX4 (MISCELLANEOUS) IMPLANT
KIT BALLN UROMAX 26 75X4 (MISCELLANEOUS)
KIT ROOM TURNOVER WOR (KITS) ×4 IMPLANT
MANIFOLD NEPTUNE II (INSTRUMENTS) ×4 IMPLANT
PACK CYSTO (CUSTOM PROCEDURE TRAY) ×4 IMPLANT
PROBE LITHO 3.3FR 2137.235 (UROLOGICAL SUPPLIES) IMPLANT
PROBE LITHO 5.0FR 2137.1505 (MISCELLANEOUS) IMPLANT
SET HIGH PRES BAL DIL (LABEL)
SHEATH ACCESS URETERAL 38CM (SHEATH) ×4 IMPLANT
STENT URET 6FRX26 CONTOUR (STENTS) ×8 IMPLANT
TUBE CONNECTING 12X1/4 (SUCTIONS) IMPLANT
WATER STERILE IRR 3000ML UROMA (IV SOLUTION) ×12 IMPLANT
WATER STERILE IRR 500ML POUR (IV SOLUTION) IMPLANT

## 2016-06-17 NOTE — Anesthesia Preprocedure Evaluation (Signed)
Anesthesia Evaluation  Patient identified by MRN, date of birth, ID band Patient awake    Reviewed: Allergy & Precautions, NPO status , Patient's Chart, lab work & pertinent test results  Airway Mallampati: II   Neck ROM: full    Dental   Pulmonary former smoker,    breath sounds clear to auscultation       Cardiovascular hypertension, + CAD, + Past MI and + CABG  + dysrhythmias Supra Ventricular Tachycardia + Valvular Problems/Murmurs AS  Rhythm:regular Rate:Normal  TTE (2015): AVA 1.69 cm2   Neuro/Psych  Headaches, Depression    GI/Hepatic hiatal hernia, GERD  ,  Endo/Other  diabetes, Type 2  Renal/GU stones     Musculoskeletal  (+) Arthritis ,   Abdominal   Peds  Hematology   Anesthesia Other Findings   Reproductive/Obstetrics                             Anesthesia Physical Anesthesia Plan  ASA: III  Anesthesia Plan: MAC   Post-op Pain Management:    Induction: Intravenous  Airway Management Planned: Simple Face Mask  Additional Equipment:   Intra-op Plan:   Post-operative Plan:   Informed Consent: I have reviewed the patients History and Physical, chart, labs and discussed the procedure including the risks, benefits and alternatives for the proposed anesthesia with the patient or authorized representative who has indicated his/her understanding and acceptance.     Plan Discussed with: CRNA, Anesthesiologist and Surgeon  Anesthesia Plan Comments:         Anesthesia Quick Evaluation

## 2016-06-17 NOTE — Brief Op Note (Signed)
06/17/2016  10:15 AM  PATIENT:  Seth Simpson  82 y.o. male  PRE-OPERATIVE DIAGNOSIS:  BILATERAL URETERAL AND BLADDER STONES  POST-OPERATIVE DIAGNOSIS:  BILATERAL URETERAL AND BLADDER STONES, BLADDER CANCER RIGHT LATERAL WALL AND LEFT LATERAL WALL.   PROCEDURE:  Procedure(s): CYSTOSCOPY WITH LITHOLAPAXY (N/A) CYSTOSCOPY BILATERAL RETROGRADE,  LEFT URETEROSCOPY AND BILATERAL STENT PLACEMENT (Bilateral) HOLMIUM LASER APPLICATION (N/A) CYSTOSCOPY WITH BIOPSY (N/A) FULGURATION OF 2.5CM LESION. FULGURATION OF 8MM LESION.   SURGEON:  Surgeon(s) and Role:    * Irine Seal, MD - Primary  PHYSICIAN ASSISTANT:   ASSISTANTS: none   ANESTHESIA:   general  EBL:  Total I/O In: 200 [I.V.:200] Out: 10 [Blood:10]  BLOOD ADMINISTERED:none  DRAINS: Urinary Catheter (Foley) and BILATERAL 6FR X 26CM JJ STENTS   LOCAL MEDICATIONS USED:  NONE  SPECIMEN:  Source of Specimen:  STONE FRAGMENTS FROM BLADDER AND BLADDER TUMOR BIOPSY  DISPOSITION OF SPECIMEN:  BLADDER TUMOR TO PATH.  STONES TO FAMILY.  COUNTS:  YES  TOURNIQUET:  * No tourniquets in log *  DICTATION: .Other Dictation: Dictation Number [id number]  PLAN OF CARE: Discharge to home after PACU  PATIENT DISPOSITION:  PACU - hemodynamically stable.   Delay start of Pharmacological VTE agent (>24hrs) due to surgical blood loss or risk of bleeding: not applicable

## 2016-06-17 NOTE — Transfer of Care (Signed)
Immediate Anesthesia Transfer of Care Note  Patient: Seth Simpson  Procedure(s) Performed: Procedure(s) (LRB): CYSTOSCOPY WITH LITHOLAPAXY (N/A) CYSTOSCOPY BILATERAL RETROGRADE,  LEFT URETEROSCOPY AND BILATERAL STENT PLACEMENT (Bilateral) HOLMIUM LASER APPLICATION (N/A) CYSTOSCOPY WITH BIOPSY (N/A)  Patient Location: PACU  Anesthesia Type: General  Level of Consciousness: awake, sedated, patient cooperative and responds to stimulation  Airway & Oxygen Therapy: Patient Spontanous Breathing and Patient connected to face mask oxygen  Post-op Assessment: Report given to PACU RN, Post -op Vital signs reviewed and stable and Patient moving all extremities  Post vital signs: Reviewed and stable  Complications: No apparent anesthesia complications

## 2016-06-17 NOTE — Anesthesia Postprocedure Evaluation (Signed)
Anesthesia Post Note  Patient: Seth Simpson  Procedure(s) Performed: Procedure(s) (LRB): CYSTOSCOPY WITH LITHOLAPAXY (N/A) CYSTOSCOPY BILATERAL RETROGRADE,  LEFT URETEROSCOPY AND BILATERAL STENT PLACEMENT (Bilateral) HOLMIUM LASER APPLICATION (N/A) CYSTOSCOPY WITH BIOPSY (N/A)  Patient location during evaluation: PACU Anesthesia Type: General Level of consciousness: awake and alert and patient cooperative Pain management: pain level controlled Vital Signs Assessment: post-procedure vital signs reviewed and stable Respiratory status: spontaneous breathing and respiratory function stable Cardiovascular status: stable Anesthetic complications: no    Last Vitals:  Filed Vitals:   06/17/16 1020 06/17/16 1030  BP: 143/76 144/70  Pulse: 68 65  Temp: 36.8 C   Resp: 13 14    Last Pain:  Filed Vitals:   06/17/16 1031  PainSc: 0-No pain                 [name] S

## 2016-06-17 NOTE — Interval H&P Note (Signed)
History and Physical Interval Note:  He has had no pain.   06/17/2016 8:19 AM  Gillian Shields  has presented today for surgery, with the diagnosis of BILATERAL URETERAL AND BLADDER STONES  The various methods of treatment have been discussed with the patient and family. After consideration of risks, benefits and other options for treatment, the patient has consented to  Procedure(s): CYSTOSCOPY WITH LITHOLAPAXY (N/A) BILATERAL RETROGRADE, POSSIBLE BILATERAL URETEROSCOPY AND STENT PLACEMENT (Bilateral) HOLMIUM LASER APPLICATION (N/A) as a surgical intervention .  The patient's history has been reviewed, patient examined, no change in status, stable for surgery.  I have reviewed the patient's chart and labs.  Questions were answered to the patient's satisfaction.     Deannie Resetar J

## 2016-06-17 NOTE — Discharge Instructions (Addendum)
Kidney Stones Kidney stones (urolithiasis) are deposits that form inside your kidneys. The intense pain is caused by the stone moving through the urinary tract. When the stone moves, the ureter goes into spasm around the stone. The stone is usually passed in the urine.  CAUSES   A disorder that makes certain neck glands produce too much parathyroid hormone (primary hyperparathyroidism).  A buildup of uric acid crystals, similar to gout in your joints.  Narrowing (stricture) of the ureter.  A kidney obstruction present at birth (congenital obstruction).  Previous surgery on the kidney or ureters.  Numerous kidney infections. SYMPTOMS  1. Feeling sick to your stomach (nauseous). 2. Throwing up (vomiting). 3. Blood in the urine (hematuria). 4. Pain that usually spreads (radiates) to the groin. 5. Frequency or urgency of urination. DIAGNOSIS  1. Taking a history and physical exam. 2. Blood or urine tests. 3. CT scan. 4. Occasionally, an examination of the inside of the urinary bladder (cystoscopy) is performed. TREATMENT  1. Observation. 2. Increasing your fluid intake. 3. Extracorporeal shock wave lithotripsy--This is a noninvasive procedure that uses shock waves to break up kidney stones. 4. Surgery may be needed if you have severe pain or persistent obstruction. There are various surgical procedures. Most of the procedures are performed with the use of small instruments. Only small incisions are needed to accommodate these instruments, so recovery time is minimized. The size, location, and chemical composition are all important variables that will determine the proper choice of action for you. Talk to your health care provider to better understand your situation so that you will minimize the risk of injury to yourself and your kidney.  HOME CARE INSTRUCTIONS  1. Drink enough water and fluids to keep your urine clear or pale yellow. This will help you to pass the stone or stone  fragments. 2. Strain all urine through the provided strainer. Keep all particulate matter and stones for your health care provider to see. The stone causing the pain may be as small as a grain of salt. It is very important to use the strainer each and every time you pass your urine. The collection of your stone will allow your health care provider to analyze it and verify that a stone has actually passed. The stone analysis will often identify what you can do to reduce the incidence of recurrences. 3. Only take over-the-counter or prescription medicines for pain, discomfort, or fever as directed by your health care provider. 4. Keep all follow-up visits as told by your health care provider. This is important. 5. Get follow-up X-rays if required. The absence of pain does not always mean that the stone has passed. It may have only stopped moving. If the urine remains completely obstructed, it can cause loss of kidney function or even complete destruction of the kidney. It is your responsibility to make sure X-rays and follow-ups are completed. Ultrasounds of the kidney can show blockages and the status of the kidney. Ultrasounds are not associated with any radiation and can be performed easily in a matter of minutes. 6. Make changes to your daily diet as told by your health care provider. You may be told to: 1. Limit the amount of salt that you eat. 2. Eat 5 or more servings of fruits and vegetables each day. 3. Limit the amount of meat, poultry, fish, and eggs that you eat. 7. Collect a 24-hour urine sample as told by your health care provider.You may need to collect another urine sample every 6-12  months. SEEK MEDICAL CARE IF:  You experience pain that is progressive and unresponsive to any pain medicine you have been prescribed. SEEK IMMEDIATE MEDICAL CARE IF:   Pain cannot be controlled with the prescribed medicine.  You have a fever or shaking chills.  The severity or intensity of pain  increases over 18 hours and is not relieved by pain medicine.  You develop a new onset of abdominal pain.  You feel faint or pass out.  You are unable to urinate.   This information is not intended to replace advice given to you by your health care provider. Make sure you discuss any questions you have with your health care provider.   Document Released: 11/29/2005 Document Revised: 08/20/2015 Document Reviewed: 05/02/2013 Elsevier Interactive Patient Education 2016 [place].  Bladder Cancer Bladder cancer is an abnormal growth of tissue in your bladder. Your bladder is the balloon-like sac in your pelvis. It collects and stores urine that comes from the kidneys through the ureters. The bladder wall is made of layers. If cancer spreads into these layers and through the wall of the bladder, it becomes more difficult to treat.  There are four stages of bladder cancer:  Stage I. Cancer at this stage occurs in the bladder's inner lining but has not invaded the muscular bladder wall.  Stage II. At this stage, cancer has invaded the bladder wall but is still confined to the bladder.  Stage III. By this stage, the cancer cells have spread through the bladder wall to surrounding tissue. They may also have spread to the prostate in men or the uterus or vagina in women.  Stage IV. By this stage, cancer cells may have spread to the lymph nodes and other organs, such as your lungs, bones, or liver. RISK FACTORS Although the cause of bladder cancer is not known, the following risk factors can increase your chances of getting bladder cancer:  6. Smoking.  7. Occupational exposures, such as rubber, leather, textile, dyes, chemicals, and paint. 57. Being white. 67. Age.  68. Being male.  11. Having chronic bladder inflammation.  12. Having a bladder cancer history.  34. Having a family history of bladder cancer (heredity).  75. Having had chemotherapy or radiation therapy to the pelvis.   51. Being exposed to arsenic.  SYMPTOMS  5. Blood in the urine.  6. Pain with urination.  7. Frequent bladder or urine infections. 8. Increase in urgency and frequency of urination. DIAGNOSIS  Your health care provider may suspect bladder cancer based on your description of urinary symptoms or based on the finding of blood or infection in the urine (especially if this has recurred several times). Other tests or procedures that may be performed include:  5. A narrow tube being inserted into your bladder through your urethra (cystoscopy) in order to view the lining of your bladder for tumors.  6. A biopsy to sample the tumor to see if cancer is present.  If cancer is present, it will then be staged to determine its severity and extent. It is important to know how deeply into the bladder wall the cancer has grown and whether the cancer has spread to any other parts of your body. Staging may require blood tests or special scans such as a CT scan, MRI, bone scan, or chest X-ray.  TREATMENT  Once your cancer has been diagnosed and staged, you should discuss a treatment plan with your health care provider. Based on the stage of the cancer, one treatment  or a combination of treatments may be recommended. The most common forms of treatment are:  8. Surgery. Procedures that may be done include transurethral resection and cystectomy. 9. Radiation therapy. This is infrequently used to treat bladder cancer.  10. Chemotherapy. During this treatment, drugs are used to kill cancer cells. 11. Immunotherapy. This is usually administered directly into the bladder. HOME CARE INSTRUCTIONS  Take medicines only as directed by your health care provider.   Maintain a healthy diet.   Consider joining a support group. This may help you learn to cope with the stress of having bladder cancer.   Seek advice to help you manage treatment side effects.   Keep all follow-up visits as directed by your health  care provider.   Inform your cancer specialist if you are admitted to the hospital.  [place] IF:  There is blood in your urine.  You have symptoms of a urinary tract infection. These include:  Tiredness.  Shakiness.  Weakness.  Muscle aches.  Abdominal pain.  Frequent and intense urge to urinate (in young women).  Burning feeling in the bladder or urethra during urination (in young women). SEEK IMMEDIATE MEDICAL CARE IF:  You are unable to urinate.   This information is not intended to replace advice given to you by your health care provider. Make sure you discuss any questions you have with your health care provider.   Document Released: 12/02/2003 Document Revised: 12/20/2014 Document Reviewed: 05/22/2013 Elsevier Interactive Patient Education 2016 [place] CARE INSTRUCTIONS  Activity: Rest for the remainder of the day.  Do not drive or operate equipment today.  You may resume normal activities in one to two days as instructed by your physician.   Meals: Drink plenty of liquids and eat light foods such as gelatin or soup this evening.  You may return to a normal meal plan tomorrow.  Return to Work: You may return to work in one to two days or as instructed by your physician.  Special Instructions / Symptoms: Call your physician if any of these symptoms occur:   -persistent or heavy bleeding  -bleeding which continues after first few urination  -large blood clots that are difficult to pass  -urine stream diminishes or stops completely  -fever equal to or higher than 101 degrees Farenheit.  -cloudy urine with a strong, foul odor  -severe pain  Females should always wipe from front to back after elimination.  You may feel some burning pain when you urinate.  This should disappear with time.  Applying moist heat to the lower abdomen or a hot tub bath may help relieve the pain. \  You may remove the catheter in the morning by  cutting the side arm and letting the fluid drain from the balloon.   The catheter should just slide out.   If you don't feel comfortable with this or have problems, please call and come to the office..  The bladder tumor appears to be most consistent with a superficial low grade lesion but it was sent for pathology.     Bring your stone to the office at your follow up so we can analyze the stone.   Patient Signature:  ________________________________________________________  Nurse's Signature:  ________________________________________________________  Post Anesthesia Home Care Instructions  Activity: Get plenty of rest for the remainder of the day. A responsible adult should stay with you for 24 hours following the procedure.  For the next 24 hours, DO NOT: -Drive a car -  Operate machinery -Drink alcoholic beverages -Take any medication unless instructed by your physician -Make any legal decisions or sign important papers.  Meals: Start with liquid foods such as gelatin or soup. Progress to regular foods as tolerated. Avoid greasy, spicy, heavy foods. If nausea and/or vomiting occur, drink only clear liquids until the nausea and/or vomiting subsides. Call your physician if vomiting continues.  Special Instructions/Symptoms: Your throat may feel dry or sore from the anesthesia or the breathing tube placed in your throat during surgery. If this causes discomfort, gargle with warm salt water. The discomfort should disappear within 24 hours.  If you had a scopolamine patch placed behind your ear for the management of post- operative nausea and/or vomiting:  1. The medication in the patch is effective for 72 hours, after which it should be removed.  Wrap patch in a tissue and discard in the trash. Wash hands thoroughly with soap and water. 2. You may remove the patch earlier than 72 hours if you experience unpleasant side effects which may include dry mouth, dizziness or visual  disturbances. 3. Avoid touching the patch. Wash your hands with soap and water after contact with the patch.   Foley Catheter Care, Adult A Foley catheter is a soft, flexible tube that is placed into the bladder to drain urine. A Foley catheter may be inserted if:  You leak urine or are not able to control when you urinate (urinary incontinence).  You are not able to urinate when you need to (urinary retention).  You had prostate surgery or surgery on the genitals.  You have certain medical conditions, such as multiple sclerosis, dementia, or a spinal cord injury. If you are going home with a Foley catheter in place, follow the instructions below. TAKING CARE OF THE CATHETER 16. Wash your hands with soap and water. 17. Using mild soap and warm water on a clean washcloth:  Clean the area on your body closest to the catheter insertion site using a circular motion, moving away from the catheter. Never wipe toward the catheter because this could sweep bacteria up into the urethra and cause infection.  Remove all traces of soap. Pat the area dry with a clean towel. For males, reposition the foreskin. 16. Attach the catheter to your leg so there is no tension on the catheter. Use adhesive tape or a leg strap. If you are using adhesive tape, remove any sticky residue left behind by the previous tape you used. 19. Keep the drainage bag below the level of the bladder, but keep it off the floor. 20. Check throughout the day to be sure the catheter is working and urine is draining freely. Make sure the tubing does not become kinked. 21. Do not pull on the catheter or try to remove it. Pulling could damage internal tissues. TAKING CARE OF THE DRAINAGE BAGS You will be given two drainage bags to take home. One is a large overnight drainage bag, and the other is a smaller leg bag that fits underneath clothing. You may wear the overnight bag at any time, but you should never wear the smaller leg bag at  night. Follow the instructions below for how to empty, change, and clean your drainage bags. Emptying the Drainage Bag You must empty your drainage bag when it is  - full or at least 2-3 times a day. 9. Wash your hands with soap and water. 10. Keep the drainage bag below your hips, below the level of your bladder. This stops urine  from going back into the tubing and into your bladder. 11. Hold the dirty bag over the toilet or a clean container. 12. Open the pour spout at the bottom of the bag and empty the urine into the toilet or container. Do not let the pour spout touch the toilet, container, or any other surface. Doing so can place bacteria on the bag, which can cause an infection. 13. Clean the pour spout with a gauze pad or cotton ball that has rubbing alcohol on it. 14. Close the pour spout. 15. Attach the bag to your leg with adhesive tape or a leg strap. 16. Wash your hands well. Changing the Drainage Bag Change your drainage bag once a month or sooner if it starts to smell bad or look dirty. Below are steps to follow when changing the drainage bag. 7. Wash your hands with soap and water. 8. Pinch off the rubber catheter so that urine does not spill out. 9. Disconnect the catheter tube from the drainage tube at the connection valve. Do not let the tubes touch any surface. 10. Clean the end of the catheter tube with an alcohol wipe. Use a different alcohol wipe to clean the end of the drainage tube. 11. Connect the catheter tube to the drainage tube of the clean drainage bag. 12. Attach the new bag to the leg with adhesive tape or a leg strap. Avoid attaching the new bag too tightly. 13. Wash your hands well. Cleaning the Drainage Bag 12. Wash your hands with soap and water. 13. Wash the bag in warm, soapy water. 14. Rinse the bag thoroughly with warm water. 15. Fill the bag with a solution of white vinegar and water (1 cup vinegar to 1 qt warm water [.2 L vinegar to 1 L warm  water]). Close the bag and soak it for 30 minutes in the solution. 16. Rinse the bag with warm water. [place] the bag to dry with the pour spout open and hanging downward. 18. Store the clean bag (once it is dry) in a clean plastic bag. 19. Wash your hands well. PREVENTING INFECTION  Wash your hands before and after handling your catheter.  Take showers daily and wash the area where the catheter enters your body. Do not take baths. Replace wet leg straps with dry ones, if this applies.  Do not use powders, sprays, or lotions on the genital area. Only use creams, lotions, or ointments as directed by your caregiver.  For females, wipe from front to back after each bowel movement.  Drink enough fluids to keep your urine clear or pale yellow unless you have a fluid restriction.  Do not let the drainage bag or tubing touch or lie on the floor.  Wear cotton underwear to absorb moisture and to keep your skin drier. SEEK MEDICAL CARE IF:   Your urine is cloudy or smells unusually bad.  Your catheter becomes clogged.  You are not draining urine into the bag or your bladder feels full.  Your catheter starts to leak. SEEK IMMEDIATE MEDICAL CARE IF:   You have pain, swelling, redness, or pus where the catheter enters the body.  You have pain in the abdomen, legs, lower back, or bladder.  You have a fever.  You see blood fill the catheter, or your urine is pink or red.  You have nausea, vomiting, or chills.  Your catheter gets pulled out. MAKE SURE YOU:   Understand these instructions.  Will watch your condition.  Will get  help right away if you are not doing well or get worse.   This information is not intended to replace advice given to you by your health care provider. Make sure you discuss any questions you have with your health care provider.   Document Released: 11/29/2005 Document Revised: 04/15/2014 Document Reviewed: 11/20/2012 Elsevier Interactive Patient Education  [employer].

## 2016-06-17 NOTE — Anesthesia Procedure Notes (Signed)
Procedure Name: LMA Insertion Date/Time: 06/17/2016 8:40 AM Performed by: Justice Rocher Pre-anesthesia Checklist: Patient identified, Emergency Drugs available, Suction available and Patient being monitored Patient Re-evaluated:Patient Re-evaluated prior to inductionOxygen Delivery Method: Circle system utilized Preoxygenation: Pre-oxygenation with 100% oxygen Intubation Type: IV induction Ventilation: Mask ventilation without difficulty LMA: LMA inserted LMA Size: 5.0 Number of attempts: 1 Airway Equipment and Method: Bite block Placement Confirmation: positive ETCO2 Tube secured with: Tape Dental Injury: Teeth and Oropharynx as per pre-operative assessment

## 2016-06-18 ENCOUNTER — Other Ambulatory Visit: Payer: Self-pay | Admitting: Urology

## 2016-06-18 NOTE — Op Note (Signed)
NAMEAMNER, KNOEDLER                ACCOUNT NO.:  [id number]  MEDICAL RECORD NO.:  [id number]  LOCATION:                                 FACILITY:  PHYSICIAN:  Marshall Cork. Jeffie Pollock, M.D.    DATE OF BIRTH:  May 19, 1931  DATE OF PROCEDURE:  06/17/2016 DATE OF DISCHARGE:                              OPERATIVE REPORT   PROCEDURES: 1. Cystoscopy with bilateral retrograde pyelogram and interpretation. 2. Cystoscopy with biopsy and fulguration of 2.5-cm right lateral wall     bladder tumor. 3. Cystoscopy and fulguration of 8-mm left lateral wall bladder tumor. 4. Cystolitholapaxy for less than 2.5-cm bladder stone. 5. Left ureteroscopy. 6. Insertion of bilateral double-J stents.  PREOPERATIVE DIAGNOSES: 1. Bilateral ureteral stones. 2. Less than 2.5-cm bladder stone.  POSTOPERATIVE DIAGNOSES: 1. Bilateral ureteral stones. 2. Less than 2.5-cm bladder stone with 2.5-cm right lateral wall     bladder tumor and 8-mm left lateral wall bladder tumor.  SURGEON:  Marshall Cork. Jeffie Pollock, M.D.  ANESTHESIA:  General.  SPECIMEN:  Bladder stone fragments and right bladder wall bladder biopsy.  DRAINS:  Bilateral 6-French x 26-cm double-J stents and a 20-French Foley catheter with 20 mL in the balloon.  BLOOD LOSS:  Approximately 30 mL.  COMPLICATIONS:  None.  INDICATIONS:  Mr. Chaffins is an 82 year old white male, who has bilateral ureteral stones and a bladder stone, it was felt to require endoscopic management.  FINDINGS AND PROCEDURES:  He was taken to the operating room where he was given Ancef, a general anesthetic was induced.  He was placed in the lithotomy position and was fitted with PAS hose.  His perineum and genitalia were prepped with Betadine solution, he was draped in usual sterile fashion.  Cystoscopy was performed using the 23-French scope with the 30 and 70- degree lenses.  He had some meatal narrowings, so the operator was required initially insert the scope.  Inspection  revealed the normal urethra.  The external sphincter was intact.  The prostatic urethra was approximately 5 cm in length with trilobar hyperplasia with a moderate middle lobe.  Examination of the bladder demonstrated severe trabeculation with multiple small cellules and diverticula.  The ureteral orifices were initially difficult to identify, but were eventually found somewhat lateral to their normal position from prostatic uplift.  In the base of the bladder, there was approximately 1.5-cm jackstone with additional small stones.  Once initial inspection was performed, the smaller stones were evacuated from the bladder with the beak of the scope.  During this procedure, there was some bleeding from the prostatic urethra due to its prominence and friability.  We had recently started with saline as an irrigant, but this was switched to sterile water for the initial portion of the procedure and a Bugbee electrode was used to fulgurate prostatic bleeding as needed.  Once the small bladder stones had been removed, I then began my surgery for the ureteral orifices.  I initially found the left ureteral orifice, it was somewhat laterally displaced, was cannulated using a 5-French open-ended catheter with the aid of a Sensor guidewire.  Once the ureteral catheter was in approximately 2 cm, the guidewire was removed and a  retrograde pyelogram was performed.  The ureter was delicate. There was marked J-hooking of the distal ureter.  I did not see the filling defect obviously in the distal ureter where the stone had been on CT scan.  There were no proximal filling defects and the internal collecting system was unremarkable.  Once the retrograde pyelogram had been completed, a Sensor wire was inserted to the kidney.  The open-ended catheter was removed and the cystoscope was removed leaving the wire in place.  The scope was then reinserted along the wire and after considerable inspection, the  left ureteral orifice was identified.  This was also cannulated using a 5-French open-ended catheter with the aid of a Sensor wire and once the open-ended catheter was in place, retrograde pyelogram was performed.  This revealed a normal caliber of the distal ureter and the proximal ureter.  There were filling defects consistent with two stones seen on plain film with some mild proximal dilation.  At this point, the guidewire was inserted through the open-ended catheter to the kidney.  There was some tortuosity at the level of the more proximal two stones.  At this point, the cystoscope was removed leaving the two Sensor wires in place and then the cystoscope was reinserted.  At this point, a cup biopsy forceps was used to biopsy the larger tumor on the right lateral wall, a very adequate specimen was obtained.  The remainder of the tumor was then generously fulgurated with the Bugbee electrode.  Once the 2.5-cm tumor had been fulgurated, I identified the left lateral wall tumor and fulgurated that as well. Thorough inspection revealed no other obvious tumors.  At this point, a 1000-micron laser fiber was inserted through the scope and the bladder stone was broken with the laser initially, 1 watt and 10 hertz were used, but the stone was quite durable, so the power was increased to 2 watts and the stone eventually fragmented adequately to be removed.  At this point, the residual stone fragments were removed and the cystoscope was removed.  A 38-cm 12/14 digital access sheath inner core was then used to attempt to dilate the ureters.  The right ureter, I could dilate to the midureter, but not beyond.  On the left, I could only dilate the very distal 2-3 cm of the ureter.  Once this was done, a 6.5-French dual-lumen semirigid ureteroscope was passed adjacent to the wire into the left ureteral orifice.  A second wire through the scope was required to obtain access.  The wire  was then removed and the distal ureteral stone could be identified, but due to the angulation and the patient's large prostate, I was unable to access the stone in a fashion that I fell was safe to attempt a laser or basket extraction.  At this point, I felt the most appropriate option would be to place bilateral ureteral stents.  The ureteroscope had been removed.  The cystoscope was reinserted over the right wire and a 6-French 26-cm Contour double-J stent was inserted to the kidney under fluoroscopic and visual guidance.  The wire was removed leaving a good coil in the kidney and a good coil in the bladder.  The cystoscope was then removed and reinserted over the left wire and a second 6-French 26-cm double-J was inserted under fluoroscopic and visual guidance.  The wire was removed leaving a good coil in the kidney and a good coil in the bladder.  At this point, the scope was removed and a 20-French  Foley catheter was inserted.  Initially 10 mL was put in the balloon, but eventually, this was taken up to 20 mL to provide additional tamponade.  The catheter was held on traction and hand irrigated until the return was nearly clear.  At this point, the catheter was placed to straight drainage.  The patient was taken down from lithotomy position.  His anesthetic was reversed.  He was moved to the recovery room in stable condition.  There were no complications, but the procedure was quite difficult.  He will be set up to return for definitive ureteral stone management in the near future.  The bladder stone fragments were given to the family and the bladder biopsy was sent to the Pathology Department.     Marshall Cork. Jeffie Pollock, M.D.   ______________________________ Marshall Cork. Jeffie Pollock, M.D.    JJW/MEDQ  D:  06/17/2016  T:  06/18/2016  Job:  [id number]

## 2016-06-19 ENCOUNTER — Emergency Department (HOSPITAL_COMMUNITY)
Admission: EM | Admit: 2016-06-19 | Discharge: 2016-06-19 | Disposition: A | Discharge status: Home / Self Care | Payer: Medicare PPO | Attending: Emergency Medicine | Admitting: Emergency Medicine

## 2016-06-19 ENCOUNTER — Encounter (HOSPITAL_COMMUNITY): Payer: Self-pay | Admitting: *Deleted

## 2016-06-19 DIAGNOSIS — E119 Type 2 diabetes mellitus without complications: Secondary | ICD-10-CM | POA: Insufficient documentation

## 2016-06-19 DIAGNOSIS — I444 Left anterior fascicular block: Secondary | ICD-10-CM | POA: Diagnosis not present

## 2016-06-19 DIAGNOSIS — R339 Retention of urine, unspecified: Secondary | ICD-10-CM | POA: Insufficient documentation

## 2016-06-19 DIAGNOSIS — I252 Old myocardial infarction: Secondary | ICD-10-CM | POA: Insufficient documentation

## 2016-06-19 DIAGNOSIS — Z7982 Long term (current) use of aspirin: Secondary | ICD-10-CM | POA: Diagnosis not present

## 2016-06-19 DIAGNOSIS — I493 Ventricular premature depolarization: Secondary | ICD-10-CM | POA: Diagnosis not present

## 2016-06-19 DIAGNOSIS — I2510 Atherosclerotic heart disease of native coronary artery without angina pectoris: Secondary | ICD-10-CM | POA: Diagnosis not present

## 2016-06-19 DIAGNOSIS — F329 Major depressive disorder, single episode, unspecified: Secondary | ICD-10-CM | POA: Diagnosis not present

## 2016-06-19 DIAGNOSIS — M069 Rheumatoid arthritis, unspecified: Secondary | ICD-10-CM | POA: Diagnosis not present

## 2016-06-19 DIAGNOSIS — I10 Essential (primary) hypertension: Secondary | ICD-10-CM | POA: Insufficient documentation

## 2016-06-19 DIAGNOSIS — I350 Nonrheumatic aortic (valve) stenosis: Secondary | ICD-10-CM | POA: Diagnosis not present

## 2016-06-19 DIAGNOSIS — R338 Other retention of urine: Secondary | ICD-10-CM

## 2016-06-19 DIAGNOSIS — R1030 Lower abdominal pain, unspecified: Secondary | ICD-10-CM

## 2016-06-19 DIAGNOSIS — Z79899 Other long term (current) drug therapy: Secondary | ICD-10-CM | POA: Insufficient documentation

## 2016-06-19 DIAGNOSIS — Z7984 Long term (current) use of oral hypoglycemic drugs: Secondary | ICD-10-CM | POA: Insufficient documentation

## 2016-06-19 DIAGNOSIS — E785 Hyperlipidemia, unspecified: Secondary | ICD-10-CM | POA: Diagnosis not present

## 2016-06-19 LAB — URINE MICROSCOPIC-ADD ON: Squamous Epithelial / LPF: NONE SEEN

## 2016-06-19 LAB — URINALYSIS, ROUTINE W REFLEX MICROSCOPIC
Bilirubin Urine: NEGATIVE
GLUCOSE, UA: NEGATIVE mg/dL
Ketones, ur: NEGATIVE mg/dL
Nitrite: NEGATIVE
PROTEIN: 100 mg/dL — AB
SPECIFIC GRAVITY, URINE: 1.016 (ref 1.005–1.030)
pH: 6 (ref 5.0–8.0)

## 2016-06-19 MED ORDER — MORPHINE SULFATE (PF) 4 MG/ML IV SOLN
4.0000 mg | INTRAVENOUS | Status: DC | PRN
  Administered 2016-06-19: 4 mg via INTRAVENOUS
  Filled 2016-06-19: qty 1

## 2016-06-19 NOTE — ED Provider Notes (Signed)
CSN: [id number]     Arrival date & time 06/19/16  0827 History   First MD Initiated Contact with Patient 06/19/16 [phone]     Chief Complaint  Patient presents with  . Abdominal Pain     (Consider location/radiation/quality/duration/timing/severity/associated sxs/prior Treatment) HPI Patient presents with concern of lower abdominal pain, decreased urine output, dysuria, hematuria. Patient has a notable history of cystoscopy 2 days ago. Patient had stent placed, retrieval of several stones. Patient had Foley catheter removed yesterday, by family members at home, per discharge instructions. He notes that since discharge he has had persistent pain, worse after removal of the Foley catheter. Minimal improvement in spite of using Norco, fentanyl provided in route. No ongoing fever, vomiting, confusion, disorientation.  Past Medical History  Diagnosis Date  . Hypertension   . Heart murmur   . GERD (gastroesophageal reflux disease)   . Erectile dysfunction   . Depression   . Bicuspid aortic valve   . Type II diabetes mellitus ([place])   . PVC's (premature ventricular contractions)   . Mild aortic stenosis     per last echo 04-23-2014  Valve Area 1.69 cm^2  . Coronary artery disease cardiologist-  dr Marlou Porch    a. s/p CABG  x5  03/2008 - presentation acute STEMI  . History of ST elevation myocardial infarction (STEMI)     04-10-2008  acute inferior  . S/P CABG x 5     04-11-2008  . LAFB (left anterior fascicular block)   . PSVT (paroxysmal supraventricular tachycardia) ([place])   . Hiatal hernia   . Bilateral ureteral calculi   . Bladder calculus   . Nephrolithiasis     bilateral non-obstructive per ct 06-04-2016  . History of prostatitis   . History of orchitis   . Rheumatoid arthritis ([place]) 10/02/2013    Dr. Lenna Gilford, methotrexate  . OA (osteoarthritis)   . History of syncope     2011 sinoatrial node dysfunction  and 11-20-2014 presyncope w/ wide complex tachycardia resolved  w/ valvasa  and adenosine  . Sigmoid diverticulosis   . Hyperlipidemia   . Lower urinary tract symptoms (LUTS)   . Hematuria   . BPH (benign prostatic hypertrophy)   . Gout     per pt 06-09-2016 stable  . Wears glasses   . Wears hearing aid     bilateral  . Iron deficiency anemia    Past Surgical History  Procedure Laterality Date  . Colonoscopy  04/06/2012    Procedure: COLONOSCOPY;  Surgeon: Garlan Fair, MD;  Location: [facility] END[facility]COPY;  Service: Endoscopy;  Laterality: N/A;  . Esophagogastroduodenoscopy  04/06/2012    Procedure: ESOPHAGOGASTRODUODENOSCOPY (EGD);  Surgeon: Garlan Fair, MD;  Location: [facility];  Service: Endoscopy;  Laterality: N/A;  . Balloon dilation  04/06/2012    Procedure: BALLOON DILATION;  Surgeon: Garlan Fair, MD;  Location: [facility] ENDOSCOPY;  Service: Endoscopy;  Laterality: N/A;  . Cataract extraction w/ intraocular lens  implant, bilateral Bilateral 2003  . Finger surgery Left 2000's    "removed BB", 2nd digit  . Appendectomy  1954  . Cholecystectomy open  1978  . Total knee arthroplasty Bilateral right 03-20-2008//  left 08-23-2007  . Cardiac catheterization  04-10-2008  dr Mallie Mussel smith    3 vessel CAD  . Coronary artery bypass graft  04-11-2008    LIMA to RI,  SVG to D1,  SVG to LAD,  sequetial SVG to Acute Marginal and pRCA  . Transthoracic echocardiogram  04-23-2014  dr  skains    mild LVH,  ef 60-65%/  Bicuspid AV with stenosis, valve area 1.69 cm^2/  mild MR and TR   Family History  Problem Relation Age of Onset  . Heart disease Father    Social History  Substance Use Topics  . Smoking status: Former Smoker -- 1.00 packs/day for 15 years    Types: Cigarettes    Quit date: 09/29/1964  . Smokeless tobacco: Never Used  . Alcohol Use: 1.2 oz/week    2 Glasses of wine per week    Review of Systems  Constitutional:       Per HPI, otherwise negative  HENT:       Per HPI, otherwise negative  Respiratory:       Per HPI, otherwise negative    Cardiovascular:       Per HPI, otherwise negative  Gastrointestinal: Positive for nausea. Negative for vomiting.  Endocrine:       Negative aside from HPI  Genitourinary:       Neg aside from HPI   Musculoskeletal:       Per HPI, otherwise negative  Skin: Negative.   Neurological: Positive for weakness. Negative for syncope.      Allergies  Peanut-containing drug products; Hydrochlorothiazide; Other; Pistachio nut (diagnostic); Codeine; and Fruit & vegetable daily  Home Medications   Prior to Admission medications   Medication Sig Start Date End Date Taking? Authorizing Provider  ACCU-CHEK AVIVA PLUS test strip  11/10/15  Yes Historical Provider, MD  ACCU-CHEK SOFTCLIX LANCETS lancets USE TO TEST BLOOD SUGAR DAILY 08/15/15  Yes Historical Provider, MD  acetaminophen (TYLENOL) 500 MG tablet Take 500 mg by mouth every 6 (six) hours as needed for mild pain, moderate pain, fever or headache.   Yes Historical Provider, MD  allopurinol (ZYLOPRIM) 300 MG tablet Take 300 mg by mouth every evening.    Yes Historical Provider, MD  amiodarone (PACERONE) 200 MG tablet Take 1 tablet (200 mg total) by mouth daily. 12/19/15  Yes Jerline Pain, MD  amLODipine-benazepril (LOTREL) 5-20 MG per capsule Take 1 capsule by mouth every evening.    Yes Historical Provider, MD  aspirin 81 MG tablet Take 81 mg by mouth every morning.    Yes Historical Provider, MD  CHERRY CONCENTRATE PO Take 1 tablet by mouth daily. OTC-Tart Cherry Extract tablet   Yes Historical Provider, MD  colchicine 0.6 MG tablet Take 0.6 mg by mouth daily as needed (gout).    Yes Historical Provider, MD  diphenhydrAMINE (BENADRYL) 25 MG tablet Take 25 mg by mouth daily as needed for allergies. Reported on 06/19/2016   Yes Historical Provider, MD  ferrous sulfate 325 (65 FE) MG tablet Take 325 mg by mouth 2 (two) times daily.   Yes Historical Provider, MD  finasteride (PROSCAR) 5 MG tablet Take 5 mg by mouth every evening.  03/24/16  Yes  Historical Provider, MD  Flaxseed, Linseed, (FLAXSEED OIL MAX STR) 1300 MG CAPS Take 1 capsule by mouth every evening.    Yes Historical Provider, MD  folic acid (FOLVITE) 1 MG tablet Take 1 mg by mouth every morning.    Yes Historical Provider, MD  HYDROcodone-acetaminophen (NORCO/VICODIN) 5-325 MG tablet Take 1 tablet by mouth every 6 (six) hours as needed for moderate pain. 06/17/16  Yes Irine Seal, MD  loratadine (CLARITIN) 10 MG tablet Take 10 mg by mouth every morning.    Yes Historical Provider, MD  metFORMIN (GLUCOPHAGE) 500 MG tablet Take 500 mg by mouth  2 (two) times daily with a meal.  11/04/14  Yes Historical Provider, MD  methotrexate (RHEUMATREX) 2.5 MG tablet Take 15 mg by mouth every Friday. Take on Fridays.  Caution:Chemotherapy. Protect from light.   Yes Historical Provider, MD  metoprolol succinate (TOPROL-XL) 25 MG 24 hr tablet Take 1 tablet (25 mg total) by mouth daily. 05/10/14  Yes Jerline Pain, MD  Multiple Vitamin (MULTIVITAMIN WITH MINERALS) TABS tablet Take 1 tablet by mouth every evening.    Yes Historical Provider, MD  naproxen (NAPRELAN) 500 MG 24 hr tablet Take 500 mg by mouth daily as needed (Pain).    Yes Historical Provider, MD  omeprazole (PRILOSEC OTC) 20 MG tablet Take 10 mg by mouth every morning.    Yes Historical Provider, MD  ondansetron (ZOFRAN ODT) 4 MG disintegrating tablet Take 1 tablet (4 mg total) by mouth every 8 (eight) hours as needed for nausea. 06/04/16  Yes Tanna Furry, MD  rosuvastatin (CRESTOR) 10 MG tablet Take 10 mg by mouth every Monday, Wednesday, and Friday.    Yes Historical Provider, MD  sertraline (ZOLOFT) 100 MG tablet Take 50 mg by mouth every morning.  10/05/14  Yes Historical Provider, MD  sildenafil (VIAGRA) 100 MG tablet Take 100 mg by mouth daily as needed for erectile dysfunction.   Yes Historical Provider, MD  tamsulosin (FLOMAX) 0.4 MG CAPS capsule Take 1 capsule (0.4 mg total) by mouth daily. 06/04/16  Yes Tanna Furry, MD  terazosin  (HYTRIN) 5 MG capsule Take 5 mg by mouth at bedtime.    Yes Historical Provider, MD  triamcinolone (NASACORT ALLERGY 24HR) 55 MCG/ACT AERO nasal inhaler Place 2 sprays into the nose daily as needed (Nasal congestion).   Yes Historical Provider, MD   BP 155/83 mmHg  Pulse 73  Temp(Src) 98.1 F (36.7 C) (Oral)  Resp 18  Ht 5\' 10"  (1.778 m)  Wt 135 lb (61.236 kg)  BMI 19.37 kg/m2  SpO2 95% Physical Exam  Constitutional: He is oriented to person, place, and time. He has a sickly appearance. No distress.  HENT:  Head: Normocephalic and atraumatic.  Eyes: Conjunctivae and EOM are normal.  Cardiovascular: Normal rate and regular rhythm.   Pulmonary/Chest: Effort normal. No stridor. No respiratory distress.  Abdominal: He exhibits no distension.    Musculoskeletal: He exhibits no edema.  Neurological: He is alert and oriented to person, place, and time.  Skin: Skin is warm and dry.  Psychiatric: He has a normal mood and affect.  Nursing note and vitals reviewed.   ED Course  Procedures (including critical care time) Labs Review Labs Reviewed  URINALYSIS, ROUTINE W REFLEX MICROSCOPIC (NOT AT [facility]) - Abnormal; Notable for the following:    Color, Urine RED (*)    APPearance CLOUDY (*)    Hgb urine dipstick LARGE (*)    Protein, ur 100 (*)    Leukocytes, UA MODERATE (*)    All other components within normal limits  URINE MICROSCOPIC-ADD ON - Abnormal; Notable for the following:    Bacteria, UA FEW (*)    All other components within normal limits    9:45 AM Foley catheter inserted, no complications  [id number] AM Patient continues to have no pain, continues to have good urine output. I discussed all findings with the patient and multiple family members. Patient will follow-up with urology in 48 hours. MDM  Patient presents with abdominal pain. Patient has a notable history of recent cystoscopy, stent placement. Here, the patient is awake and  alert, afebrile, and pain resolves  with placement of a urinary catheter. No evidence for infection, though with recent procedure, this is a concern. Culture sent. With resolution of symptoms, close urology follow-up, the patient was discharged in stable condition.  Carmin Muskrat, MD 06/19/16 1104

## 2016-06-19 NOTE — ED Notes (Signed)
Bed: [id number] Expected date:  Expected time:  Means of arrival:  Comments: EMS 82 yo M abd pain

## 2016-06-19 NOTE — ED Notes (Signed)
Bladder scan resulted 200 mL. 

## 2016-06-19 NOTE — ED Notes (Addendum)
Per EMS - patient comes from home with low abdominal pain which has worsened over past 24 hours with decline in urinary output.  Patient was seen here at end of June and dx with renal calculi.  He had a cystoscopy with stent placement 2 days ago by Dr. Jeffie Pollock and is anticipated to return to the OR later this month for another cystoscopy. Vitals WNL, 140/86, HR 82, 93% on RA, RR 18.  CBG 280.  Patient received 100 mcg of Fentanyl en route with EMS and is taking 10 mg Oxycodone for pain with no relief.

## 2016-06-19 NOTE — ED Notes (Signed)
Patient c/o decreased urinary output, low abdominal pain and hematuria since a catheter was removed yesterday.  Patient had a cystoscopy 2 days ago with stent placement. Patient denies fever and vomiting, but endorses "some nausea."  Patient's abdomen soft, but "uncomfortable" with palpation. Patient A&O x4 and rating pain 5/10 after 100 mcg of Fentanyl from EMS.

## 2016-06-19 NOTE — Discharge Instructions (Signed)
As discussed, it is important that you follow up with your physician for continued management of your condition.  Recall that a secondary test is being conducted on your urinalysis.  If this result is positive, you will be made aware of the results.  If you develop any new, or concerning changes in your condition, please return to the emergency department immediately.

## 2016-06-22 ENCOUNTER — Encounter (HOSPITAL_BASED_OUTPATIENT_CLINIC_OR_DEPARTMENT_OTHER): Payer: Self-pay | Admitting: *Deleted

## 2016-06-22 NOTE — Progress Notes (Signed)
NPO AFTER MN.  ARRIVE AT 0800.  CURRENT LAB RESULTS AND EKG IN CHART AND EPIC.  WILL TAKE AM MEDS W/ SIPS OF WATER DOS WITH EXCEPTION NO METFORMIN.

## 2016-06-23 ENCOUNTER — Encounter (HOSPITAL_BASED_OUTPATIENT_CLINIC_OR_DEPARTMENT_OTHER): Payer: Self-pay | Admitting: Urology

## 2016-06-30 NOTE — H&P (View-Only) (Signed)
: I have ureteral stone.  HPI: Seth Simpson is a 82 year-old male established patient who is here for ureteral stone.  The problem is on both sides. He first stated noticing pain on 06/04/2000. He is currently having flank pain, nausea, and vomiting. He denies having back pain, groin pain, fever, and chills. Pain is occuring on the left side. He has not caught a stone in his urine strainer since his symptoms began.   He has never had surgical treatment for calculi in the past.   He had been seen by Dr. Matilde Sprang a couple of weeks ago for microhematuria but had no pain at that time. He was seen in the ER on 6/23 with LLQ pain and nausea and a CT showed 6 and 87mm right proximal stones that had been in the RLP a couple of weeks before and an 65mm LUVJ stone with additional more proximal stones in the distal ureter.Marland Kitchen He also has a very large prostate with a spiculated bladder stone and smaller stones. He is currently symptom free other than some low back pain but has passed no stones.      CC: I have bladder stones.  HPI:   Multiple stones on CT with the largest about 77mm and spiculated.      CC: I have symptoms of an enlarged prostate.  HPI: He first noticed the symptoms approximately 05/14/2015. His symptoms have gotten worse over the last year. He has been treated with Hytrin.   He has a history of BPH with BOO and has been on terazosin and finasteride for many years. He has some urgency with rare UUI. He has a reduced stream but feels emptying. he has no hematuria at this time.      ALLERGIES: Papaya Peanuts Pistachios    MEDICATIONS: Aspirin 81 mg tablet, chewable 1 tablet PO Daily  Finasteride 5 mg tablet 1 tablet PO Daily  Metoprolol Succinate 25 mg tablet, extended release 24 hr 1 tablet PO Daily  Omeprazole 20 mg capsule,delayed release 1 capsule PO Daily  Terazosin Hcl 5 mg capsule 1 capsule PO Daily  Viagra 100 mg tablet 1 tablet PO Daily  Cherry Concentrate 1 PO Daily   Colchicine 0.6 mg capsule 1 capsule PO Daily  Diphenhydramine Hcl 25 mg capsule 1 capsule PO Daily  Ferrous Sulfate 325 mg (65 mg iron) tablet 1 tablet PO Daily  Flaxseed Oil 1 PO Daily  Folic Acid 1 mg tablet 1 tablet PO Daily  Loratadine 10 mg tablet 1 tablet PO Daily  Methotrexate 2.5 mg tablet 1 tablet PO Daily  Multivitamin 1 PO Daily  Naproxen 500 mg tablet 1 tablet PO Daily  Nasacort 55 mcg aerosol, spray 1 PO Daily  Rosuvastatin Calcium 10 mg tablet 1 tablet PO Daily  Sertraline Hcl 100 mg tablet 1 tablet PO Daily     GU PSH: Locm 300-399Mg /Ml Iodine,1Ml - 05/21/2016    NON-GU PSH: Coronary Artery Bypass Grafting - about 2009        GU PMH: Other microscopic hematuria, Hematuria, microscopic - 05/05/2016 Poor urinary stream, Weak urinary stream - 05/05/2016 Kidney Stone    NON-GU PMH: Encounter for general adult medical examination without abnormal findings, Encounter for preventive health examination - 05/05/2016 Essential (primary) hypertension Gout, unspecified Rheumatoid arthritis, unspecified Type 2 diabetes mellitus without complications    FAMILY HISTORY: Arthritis - Mother Heart Attack - Father   SOCIAL HISTORY: Marital Status: Married Current Smoking Status: Patient does not smoke anymore.  Does not drink  caffeine. Has not had a blood transfusion. Patient's occupation is/was retired.    REVIEW OF SYSTEMS:     GU Review Male:  Patient reports hard to postpone urination, get up at night to urinate, and erection problems. Patient denies frequent urination, burning/ pain with urination, leakage of urine, stream starts and stops, trouble starting your stream, have to strain to urinate , and penile pain.    Gastrointestinal (Upper):  Patient denies nausea, vomiting, and indigestion/ heartburn.    Gastrointestinal (Lower):  Patient denies diarrhea and constipation.    Constitutional:  Patient denies fever, night sweats, weight loss, and fatigue.    Skin:  Patient  denies skin rash/ lesion and itching.    Eyes:  Patient denies blurred vision and double vision.    Ears/ Nose/ Throat:  Patient denies sore throat and sinus problems.    Hematologic/Lymphatic:  Patient reports easy bruising. Patient denies swollen glands.    Cardiovascular:  Patient denies leg swelling and chest pains.    Respiratory:  Patient denies cough and shortness of breath.    Endocrine:  Patient denies excessive thirst.    Musculoskeletal:  Patient reports back pain and joint pain.     Neurological:  Patient denies headaches and dizziness.    Psychologic:  Patient denies depression and anxiety.    VITAL SIGNS:       06/07/2016 09:05 AM     Weight 140 lb / 63.5 kg     Height 70 in / 178 cm     BP 111/67 mmHg     Pulse 61 /min     Temperature 98.3 F / 37 C     BMI 20.1 kg/m     MULTI-SYSTEM PHYSICAL EXAMINATION:      Constitutional: Well-nourished. No physical deformities. Normally developed. Good grooming.     Respiratory: No labored breathing, no use of accessory muscles.      Cardiovascular: Heart murmur. Normal temperature, no swelling.      Gastrointestinal: No mass, no tenderness, no rigidity, non obese abdomen.            PAST DATA REVIEWED:   Source Of History:  Patient  Lab Test Review:  BMP  Records Review:  Previous Doctor Records, Previous Hospital Records  Urine Test Review:  Urinalysis  X-Ray Review: C.T. Abdomen/Pelvis: Reviewed Films. Reviewed Report. I have reviewed both scans from 6/9 and 6/23.    Notes:  He had a mild elevation in the Cr to 1.28 on 6/23.    PROCEDURES:    KUB - 74000  A single view of the abdomen is obtained. He has a 82mm RLP stone and 2 right proximal stones with the largest at 76mm. There are tiny left renal stones. The previously noted left distal stones are not clearly seen. There is a 57mm jack stone in the right bladder area. He has marked lumbar degenerative disease and some vascular calcifications but gas or soft tissue  abnormalities. There are clips in the RUQ and a phlebolith in the left pelvis.           Urinalysis w/Scope - 81001  Dipstick Dipstick Cont'd Micro  Specimen: Voided Bilirubin: Neg WBC/hpf: 0-5/hpf  Color: Yellow Ketones: Neg RBC/hpf: 40-60/hpf  Appearance: Clear Blood: 3+ Bacteria: NS (Not Seen)  Specific Gravity: 1.020 Protein: Trace Cystals: NS (Not Seen)  pH: 5.5 Urobilinogen: 0.2 Casts: NS (Not Seen)  Glucose: Neg Nitrites: Neg Trichomonas: Not Present   Leukocyte Esterase: Neg Mucous: Not Present  Epithelial Cells: 0-5/hpf    Yeast: NS (Not Seen)    Sperm: Not Present    ASSESSMENT:     ICD-10 Details  1 GU:  Calculus Ureter - N20.1 Bilateral, He has left distal stones and right proximal stones with minimal symptoms.   2  Bladder Stone - N21.0 He has multiple bladder stones.   3  BPH w/LUTS - N40.1 Stable - He has a large prostate but is voiding well on medical therapy.   PLAN:   Orders  X-Rays: KUB  Schedule  Return Visit: ASAP - Schedule Surgery  Return Notes: He has bladder and bilateral ureteral stone and needs cystolithalopaxy and possible bilateral ureteroscopy.   Document  Letter(s):  Created for Patient: Clinical Summary   Notes:  I have reviewed the risks of the recommended surgery including bleeding, infection, injury to the urethra, bladder and ureters with possible stricturing, need for stents or secondary procedures, thrombotic events and anesthetic complications.

## 2016-06-30 NOTE — Interval H&P Note (Signed)
History and Physical Interval Note:  He was found to have bladder tumors at his initial procedure and they were biopsied and fulgurated and found to be low grade.   I stented both ureters but couldn't dilate the ureters so he returns now to attempt bilateral stone extraction for the left distal stone and the right proximal stones.   He had a very large prostate that made the procedure more difficult.  06/30/2016 1:20 PM  Gillian Shields  has presented today for surgery, with the diagnosis of BILATERAL URETERAL STONE   The various methods of treatment have been discussed with the patient and family. After consideration of risks, benefits and other options for treatment, the patient has consented to  Procedure(s): BILATERAL URETEROSCOPY STONE EXTRACTIONS WITH POSSIBLE STENT PLACEMENT (Bilateral) HOLMIUM LASER APPLICATION (Bilateral) as a surgical intervention .  The patient's history has been reviewed, patient examined, no change in status, stable for surgery.  I have reviewed the patient's chart and labs.  Questions were answered to the patient's satisfaction.     Mykira Hofmeister J

## 2016-07-01 ENCOUNTER — Ambulatory Visit (HOSPITAL_BASED_OUTPATIENT_CLINIC_OR_DEPARTMENT_OTHER)
Admission: RE | Admit: 2016-07-01 | Discharge: 2016-07-01 | Disposition: A | Discharge status: Home / Self Care | Payer: Medicare PPO | Source: Ambulatory Visit | Attending: Urology | Admitting: Urology

## 2016-07-01 ENCOUNTER — Ambulatory Visit (HOSPITAL_BASED_OUTPATIENT_CLINIC_OR_DEPARTMENT_OTHER): Payer: Medicare PPO | Admitting: Anesthesiology

## 2016-07-01 ENCOUNTER — Encounter (HOSPITAL_BASED_OUTPATIENT_CLINIC_OR_DEPARTMENT_OTHER): Payer: Self-pay | Admitting: Anesthesiology

## 2016-07-01 ENCOUNTER — Encounter (HOSPITAL_BASED_OUTPATIENT_CLINIC_OR_DEPARTMENT_OTHER)
Admission: RE | Disposition: A | Discharge status: Home / Self Care | Payer: Self-pay | Source: Ambulatory Visit | Attending: Urology

## 2016-07-01 DIAGNOSIS — M109 Gout, unspecified: Secondary | ICD-10-CM | POA: Diagnosis not present

## 2016-07-01 DIAGNOSIS — Z87891 Personal history of nicotine dependence: Secondary | ICD-10-CM | POA: Insufficient documentation

## 2016-07-01 DIAGNOSIS — F329 Major depressive disorder, single episode, unspecified: Secondary | ICD-10-CM | POA: Diagnosis not present

## 2016-07-01 DIAGNOSIS — N401 Enlarged prostate with lower urinary tract symptoms: Secondary | ICD-10-CM | POA: Diagnosis not present

## 2016-07-01 DIAGNOSIS — E119 Type 2 diabetes mellitus without complications: Secondary | ICD-10-CM | POA: Insufficient documentation

## 2016-07-01 DIAGNOSIS — I471 Supraventricular tachycardia: Secondary | ICD-10-CM | POA: Diagnosis not present

## 2016-07-01 DIAGNOSIS — I252 Old myocardial infarction: Secondary | ICD-10-CM | POA: Diagnosis not present

## 2016-07-01 DIAGNOSIS — N138 Other obstructive and reflux uropathy: Secondary | ICD-10-CM | POA: Diagnosis not present

## 2016-07-01 DIAGNOSIS — M069 Rheumatoid arthritis, unspecified: Secondary | ICD-10-CM | POA: Diagnosis not present

## 2016-07-01 DIAGNOSIS — Z7982 Long term (current) use of aspirin: Secondary | ICD-10-CM | POA: Insufficient documentation

## 2016-07-01 DIAGNOSIS — I10 Essential (primary) hypertension: Secondary | ICD-10-CM | POA: Diagnosis not present

## 2016-07-01 DIAGNOSIS — N201 Calculus of ureter: Secondary | ICD-10-CM | POA: Insufficient documentation

## 2016-07-01 DIAGNOSIS — N210 Calculus in bladder: Secondary | ICD-10-CM | POA: Insufficient documentation

## 2016-07-01 DIAGNOSIS — I2510 Atherosclerotic heart disease of native coronary artery without angina pectoris: Secondary | ICD-10-CM | POA: Diagnosis not present

## 2016-07-01 DIAGNOSIS — Z79899 Other long term (current) drug therapy: Secondary | ICD-10-CM | POA: Diagnosis not present

## 2016-07-01 DIAGNOSIS — K219 Gastro-esophageal reflux disease without esophagitis: Secondary | ICD-10-CM | POA: Insufficient documentation

## 2016-07-01 DIAGNOSIS — I444 Left anterior fascicular block: Secondary | ICD-10-CM | POA: Diagnosis not present

## 2016-07-01 DIAGNOSIS — Z951 Presence of aortocoronary bypass graft: Secondary | ICD-10-CM | POA: Insufficient documentation

## 2016-07-01 HISTORY — DX: Personal history of other diseases of urinary system: Z87.448

## 2016-07-01 HISTORY — PX: CYSTOSCOPY WITH URETEROSCOPY AND STENT PLACEMENT: SHX6377

## 2016-07-01 HISTORY — PX: HOLMIUM LASER APPLICATION: SHX5852

## 2016-07-01 LAB — GLUCOSE, CAPILLARY
GLUCOSE-CAPILLARY: 156 mg/dL — AB (ref 65–99)
Glucose-Capillary: 184 mg/dL — ABNORMAL HIGH (ref 65–99)

## 2016-07-01 SURGERY — CYSTOURETEROSCOPY, WITH STENT INSERTION
Anesthesia: Monitor Anesthesia Care | Laterality: Bilateral

## 2016-07-01 MED ORDER — ONDANSETRON HCL 4 MG/2ML IJ SOLN
INTRAMUSCULAR | Status: AC
  Filled 2016-07-01: qty 2

## 2016-07-01 MED ORDER — SODIUM CHLORIDE 0.9 % IV SOLN
250.0000 mL | INTRAVENOUS | Status: DC | PRN
  Filled 2016-07-01: qty 250

## 2016-07-01 MED ORDER — ONDANSETRON HCL 4 MG/2ML IJ SOLN
INTRAMUSCULAR | Status: DC | PRN
  Administered 2016-07-01: 4 mg via INTRAVENOUS

## 2016-07-01 MED ORDER — MEPERIDINE HCL 25 MG/ML IJ SOLN
6.2500 mg | INTRAMUSCULAR | Status: DC | PRN
  Filled 2016-07-01: qty 1

## 2016-07-01 MED ORDER — FENTANYL CITRATE (PF) 100 MCG/2ML IJ SOLN
INTRAMUSCULAR | Status: AC
Start: 2016-07-01 — End: 2016-07-01
  Filled 2016-07-01: qty 2

## 2016-07-01 MED ORDER — BELLADONNA ALKALOIDS-OPIUM 16.2-60 MG RE SUPP
RECTAL | Status: AC
  Filled 2016-07-01: qty 1

## 2016-07-01 MED ORDER — PROPOFOL 10 MG/ML IV BOLUS
INTRAVENOUS | Status: DC | PRN
  Administered 2016-07-01: 100 mg via INTRAVENOUS
  Administered 2016-07-01: 30 mg via INTRAVENOUS

## 2016-07-01 MED ORDER — ACETAMINOPHEN 160 MG/5ML PO SOLN
650.0000 mg | Freq: Once | ORAL | Status: AC
  Administered 2016-07-01: 650 mg via ORAL
  Filled 2016-07-01: qty 20.3

## 2016-07-01 MED ORDER — LIDOCAINE HCL (CARDIAC) 20 MG/ML IV SOLN
INTRAVENOUS | Status: AC
  Filled 2016-07-01: qty 5

## 2016-07-01 MED ORDER — LIDOCAINE HCL (CARDIAC) 20 MG/ML IV SOLN
INTRAVENOUS | Status: DC | PRN
  Administered 2016-07-01: 60 mg via INTRAVENOUS

## 2016-07-01 MED ORDER — PHENYLEPHRINE HCL 10 MG/ML IJ SOLN
INTRAMUSCULAR | Status: DC | PRN
  Administered 2016-07-01 (×3): 40 ug via INTRAVENOUS

## 2016-07-01 MED ORDER — CEFAZOLIN SODIUM-DEXTROSE 2-4 GM/100ML-% IV SOLN
2.0000 g | INTRAVENOUS | Status: AC
  Administered 2016-07-01: 2 g via INTRAVENOUS
  Filled 2016-07-01: qty 100

## 2016-07-01 MED ORDER — OXYCODONE HCL 5 MG PO TABS
5.0000 mg | ORAL_TABLET | ORAL | Status: DC | PRN
  Filled 2016-07-01: qty 2

## 2016-07-01 MED ORDER — ACETAMINOPHEN 160 MG/5ML PO SOLN
ORAL | Status: AC
  Filled 2016-07-01: qty 20.3

## 2016-07-01 MED ORDER — SODIUM CHLORIDE 0.9% FLUSH
3.0000 mL | INTRAVENOUS | Status: DC | PRN
  Filled 2016-07-01: qty 3

## 2016-07-01 MED ORDER — EPHEDRINE SULFATE 50 MG/ML IJ SOLN
INTRAMUSCULAR | Status: DC | PRN
  Administered 2016-07-01 (×3): 10 mg via INTRAVENOUS

## 2016-07-01 MED ORDER — SODIUM CHLORIDE 0.9% FLUSH
3.0000 mL | Freq: Two times a day (BID) | INTRAVENOUS | Status: DC
  Filled 2016-07-01: qty 3

## 2016-07-01 MED ORDER — PROPOFOL 10 MG/ML IV BOLUS
INTRAVENOUS | Status: AC
  Filled 2016-07-01: qty 20

## 2016-07-01 MED ORDER — EPHEDRINE SULFATE 50 MG/ML IJ SOLN
INTRAMUSCULAR | Status: AC
  Filled 2016-07-01: qty 1

## 2016-07-01 MED ORDER — LIDOCAINE HCL 2 % EX GEL
CUTANEOUS | Status: AC
  Filled 2016-07-01: qty 5

## 2016-07-01 MED ORDER — FENTANYL CITRATE (PF) 100 MCG/2ML IJ SOLN
INTRAMUSCULAR | Status: DC | PRN
  Administered 2016-07-01 (×8): 12.5 ug via INTRAVENOUS

## 2016-07-01 MED ORDER — PHENYLEPHRINE 40 MCG/ML (10ML) SYRINGE FOR IV PUSH (FOR BLOOD PRESSURE SUPPORT)
PREFILLED_SYRINGE | INTRAVENOUS | Status: AC
  Filled 2016-07-01: qty 10

## 2016-07-01 MED ORDER — CEFAZOLIN SODIUM-DEXTROSE 2-4 GM/100ML-% IV SOLN
INTRAVENOUS | Status: AC
  Filled 2016-07-01: qty 100

## 2016-07-01 MED ORDER — FENTANYL CITRATE (PF) 100 MCG/2ML IJ SOLN
25.0000 ug | INTRAMUSCULAR | Status: DC | PRN
  Filled 2016-07-01: qty 1

## 2016-07-01 MED ORDER — LACTATED RINGERS IV SOLN
INTRAVENOUS | Status: DC
  Administered 2016-07-01 (×2): via INTRAVENOUS
  Filled 2016-07-01: qty 1000

## 2016-07-01 MED ORDER — ACETAMINOPHEN 325 MG PO TABS
650.0000 mg | ORAL_TABLET | ORAL | Status: DC | PRN
  Filled 2016-07-01: qty 2

## 2016-07-01 MED ORDER — SODIUM CHLORIDE 0.9 % IR SOLN
Status: DC | PRN
  Administered 2016-07-01: 1000 mL via INTRAVESICAL
  Administered 2016-07-01: 3000 mL via INTRAVESICAL

## 2016-07-01 MED ORDER — ONDANSETRON HCL 4 MG/2ML IJ SOLN
4.0000 mg | Freq: Once | INTRAMUSCULAR | Status: DC | PRN
  Filled 2016-07-01: qty 2

## 2016-07-01 MED ORDER — ACETAMINOPHEN 650 MG RE SUPP
650.0000 mg | RECTAL | Status: DC | PRN
  Filled 2016-07-01: qty 1

## 2016-07-01 SURGICAL SUPPLY — 37 items
BAG DRAIN URO-CYSTO SKYTR STRL (DRAIN) ×2 IMPLANT
BASKET LASER NITINOL 1.9FR (BASKET) IMPLANT
BASKET SEGURA 3FR (UROLOGICAL SUPPLIES) IMPLANT
BASKET STNLS GEMINI 4WIRE 3FR (BASKET) IMPLANT
BASKET STONE 1.7 NGAGE (UROLOGICAL SUPPLIES) ×6 IMPLANT
BASKET STONE NCOMPASS (UROLOGICAL SUPPLIES) ×2 IMPLANT
BASKET ZERO TIP NITINOL 2.4FR (BASKET) IMPLANT
CANISTER SUCT LVC 12 LTR MEDI- (MISCELLANEOUS) ×2 IMPLANT
CATH URET 5FR 28IN CONE TIP (BALLOONS)
CATH URET 5FR 28IN OPEN ENDED (CATHETERS) IMPLANT
CATH URET 5FR 70CM CONE TIP (BALLOONS) IMPLANT
CLOTH BEACON ORANGE TIMEOUT ST (SAFETY) ×2 IMPLANT
ELECT REM PT RETURN 9FT ADLT (ELECTROSURGICAL)
ELECTRODE REM PT RTRN 9FT ADLT (ELECTROSURGICAL) IMPLANT
FIBER LASER FLEXIVA 1000 (UROLOGICAL SUPPLIES) IMPLANT
FIBER LASER FLEXIVA 365 (UROLOGICAL SUPPLIES) ×2 IMPLANT
FIBER LASER FLEXIVA 550 (UROLOGICAL SUPPLIES) IMPLANT
FIBER LASER TRAC TIP (UROLOGICAL SUPPLIES) IMPLANT
GLOVE SURG SS PI 8.0 STRL IVOR (GLOVE) ×2 IMPLANT
GOWN STRL REUS W/ TWL LRG LVL3 (GOWN DISPOSABLE) ×1 IMPLANT
GOWN STRL REUS W/ TWL XL LVL3 (GOWN DISPOSABLE) ×1 IMPLANT
GOWN STRL REUS W/TWL LRG LVL3 (GOWN DISPOSABLE) ×1
GOWN STRL REUS W/TWL XL LVL3 (GOWN DISPOSABLE) ×1
GUIDEWIRE 0.038 PTFE COATED (WIRE) IMPLANT
GUIDEWIRE ANG ZIPWIRE 038X150 (WIRE) IMPLANT
GUIDEWIRE STR DUAL SENSOR (WIRE) ×4 IMPLANT
IV NS IRRIG 3000ML ARTHROMATIC (IV SOLUTION) ×4 IMPLANT
KIT BALLIN UROMAX 15FX10 (LABEL) IMPLANT
KIT BALLN UROMAX 15FX4 (MISCELLANEOUS) IMPLANT
KIT BALLN UROMAX 26 75X4 (MISCELLANEOUS)
KIT ROOM TURNOVER WOR (KITS) ×2 IMPLANT
MANIFOLD NEPTUNE II (INSTRUMENTS) ×2 IMPLANT
PACK CYSTO (CUSTOM PROCEDURE TRAY) ×2 IMPLANT
SET HIGH PRES BAL DIL (LABEL)
SHEATH ACCESS URETERAL 38CM (SHEATH) ×2 IMPLANT
STENT URET 6FRX26 CONTOUR (STENTS) ×2 IMPLANT
TUBE CONNECTING 12X1/4 (SUCTIONS) ×2 IMPLANT

## 2016-07-01 NOTE — Transfer of Care (Deleted)
Immediate Anesthesia Transfer of Care Note  Patient: Seth Simpson  Procedure(s) Performed: Procedure(s) (LRB): BILATERAL URETEROSCOPY STONE EXTRACTIONS WITH POSSIBLE STENT PLACEMENT (Bilateral) HOLMIUM LASER APPLICATION (Bilateral)  Patient Location: PACU  Anesthesia Type: General  Level of Consciousness: awake, sedated, patient cooperative and responds to stimulation  Airway & Oxygen Therapy: Patient Spontanous Breathing and Patient connected to face mask oxygen  Post-op Assessment: Report given to PACU RN, Post -op Vital signs reviewed and stable and Patient moving all extremities  Post vital signs: Reviewed and stable  Complications: No apparent anesthesia complications

## 2016-07-01 NOTE — Interval H&P Note (Signed)
History and Physical Interval Note:  He had stents placed and returns for stone removal.   07/01/2016 9:23 AM  Seth Simpson  has presented today for surgery, with the diagnosis of BILATERAL URETERAL STONE   The various methods of treatment have been discussed with the patient and family. After consideration of risks, benefits and other options for treatment, the patient has consented to  Procedure(s): BILATERAL URETEROSCOPY STONE EXTRACTIONS WITH POSSIBLE STENT PLACEMENT (Bilateral) HOLMIUM LASER APPLICATION (Bilateral) as a surgical intervention .  The patient's history has been reviewed, patient examined, no change in status, stable for surgery.  I have reviewed the patient's chart and labs.  Questions were answered to the patient's satisfaction.     Afifa Truax J

## 2016-07-01 NOTE — Discharge Instructions (Addendum)
Ureteral Stent Implantation, Care After Refer to this sheet in the next few weeks. These instructions provide you with information on caring for yourself after your procedure. Your health care provider may also give you more specific instructions. Your treatment has been planned according to current medical practices, but problems sometimes occur. Call your health care provider if you have any problems or questions after your procedure. WHAT TO EXPECT AFTER THE PROCEDURE You should be back to normal activity within 48 hours after the procedure. Nausea and vomiting may occur and are commonly the result of anesthesia. It is common to experience sharp pain in the back or lower abdomen and penis with voiding. This is caused by movement of the ends of the stent with the act of urinating.It usually goes away within minutes after you have stopped urinating. HOME CARE INSTRUCTIONS Make sure to drink plenty of fluids. You may have small amounts of bleeding, causing your urine to be red. This is normal. Certain movements may trigger pain or a feeling that you need to urinate. You may be given medicines to prevent infection or bladder spasms. Be sure to take all medicines as directed. Only take over-the-counter or prescription medicines for pain, discomfort, or fever as directed by your health care provider. Do not take aspirin, as this can make bleeding worse. Your stent will be left in until the blockage is resolved. This may take 2 weeks or longer, depending on the reason for stent implantation. You may have an X-ray exam to make sure your ureter is open and that the stent has not moved out of position (migrated). The stent can be removed by your health care provider in the office. Medicines may be given for comfort while the stent is being removed. Be sure to keep all follow-up appointments so your health care provider can check that you are healing properly. SEEK MEDICAL CARE IF:  You experience increasing  pain.  Your pain medicine is not working. SEEK IMMEDIATE MEDICAL CARE IF:  Your urine is dark red or has blood clots.  You are leaking urine (incontinent).  You have a fever, chills, feeling sick to your stomach (nausea), or vomiting.  Your pain is not relieved by pain medicine.  The end of the stent comes out of the urethra.  You are unable to urinate.   This information is not intended to replace advice given to you by your health care provider. Make sure you discuss any questions you have with your health care provider.   Document Released: 08/01/2013 Document Revised: 12/04/2013 Document Reviewed: 06/13/2015 Elsevier Interactive Patient Education 2016 [facility] Home Care Instructions  Activity: Get plenty of rest for the remainder of the day. A responsible adult should stay with you for 24 hours following the procedure.  For the next 24 hours, DO NOT: -Drive a car -Paediatric nurse -Drink alcoholic beverages -Take any medication unless instructed by your physician -Make any legal decisions or sign important papers.  Meals: Start with liquid foods such as gelatin or soup. Progress to regular foods as tolerated. Avoid greasy, spicy, heavy foods. If nausea and/or vomiting occur, drink only clear liquids until the nausea and/or vomiting subsides. Call your physician if vomiting continues.  Special Instructions/Symptoms: Your throat may feel dry or sore from the anesthesia or the breathing tube placed in your throat during surgery. If this causes discomfort, gargle with warm salt water. The discomfort should disappear within 24 hours.  If you had a scopolamine patch placed  behind your ear for the management of post- operative nausea and/or vomiting:  1. The medication in the patch is effective for 72 hours, after which it should be removed.  Wrap patch in a tissue and discard in the trash. Wash hands thoroughly with soap and water. 2. You may remove the  patch earlier than 72 hours if you experience unpleasant side effects which may include dry mouth, dizziness or visual disturbances. 3. Avoid touching the patch. Wash your hands with soap and water after contact with the patch.

## 2016-07-01 NOTE — Transfer of Care (Signed)
Immediate Anesthesia Transfer of Care Note  Patient: Seth Simpson  Procedure(s) Performed: Procedure(s) (LRB): BILATERAL URETEROSCOPY STONE EXTRACTIONS WITH POSSIBLE STENT PLACEMENT (Bilateral) HOLMIUM LASER APPLICATION (Bilateral)  Patient Location: PACU  Anesthesia Type: General  Level of Consciousness: awake, sedated, patient cooperative and responds to stimulation  Airway & Oxygen Therapy: Patient Spontanous Breathing and Patient connected to face mask oxygen  Post-op Assessment: Report given to PACU RN, Post -op Vital signs reviewed and stable and Patient moving all extremities  Post vital signs: Reviewed and stable  Complications: No apparent anesthesia complications

## 2016-07-01 NOTE — Brief Op Note (Signed)
07/01/2016  11:17 AM  PATIENT:  Seth Simpson  82 y.o. male  PRE-OPERATIVE DIAGNOSIS:  BILATERAL URETERAL STONE   POST-OPERATIVE DIAGNOSIS:  Same with right renal stones.   PROCEDURE:  Procedure(s): BILATERAL URETEROSCOPY STONE EXTRACTIONS WITH POSSIBLE STENT PLACEMENT (Bilateral) HOLMIUM LASER APPLICATION (Bilateral)  SURGEON:  Surgeon(s) and Role:    * Irine Seal, MD - Primary  PHYSICIAN ASSISTANT:   ASSISTANTS: none   ANESTHESIA:   general  EBL:  Total I/O In: 300 [I.V.:300] Out: -   BLOOD ADMINISTERED:none  DRAINS: 6 x 26 right JJ stent with tether.   LOCAL MEDICATIONS USED:  NONE  SPECIMEN:  Source of Specimen:  stone fragments  DISPOSITION OF SPECIMEN:  to patient  COUNTS:  YES  TOURNIQUET:  * No tourniquets in log *  DICTATION: .Other Dictation: Dictation Number [id number]  PLAN OF CARE: Discharge to home after PACU  PATIENT DISPOSITION:  PACU - hemodynamically stable.   Delay start of Pharmacological VTE agent (>24hrs) due to surgical blood loss or risk of bleeding: not applicable

## 2016-07-01 NOTE — Anesthesia Procedure Notes (Signed)
Procedure Name: LMA Insertion Date/Time: 07/01/2016 9:38 AM Performed by: Justice Rocher Pre-anesthesia Checklist: Patient identified, Emergency Drugs available, Suction available and Patient being monitored Patient Re-evaluated:Patient Re-evaluated prior to inductionOxygen Delivery Method: Circle System Utilized Preoxygenation: Pre-oxygenation with 100% oxygen Intubation Type: IV induction Ventilation: Mask ventilation without difficulty LMA: LMA inserted LMA Size: 5.0 Number of attempts: 1 Airway Equipment and Method: bite block Placement Confirmation: positive ETCO2 Tube secured with: Tape Dental Injury: Teeth and Oropharynx as per pre-operative assessment

## 2016-07-01 NOTE — Anesthesia Preprocedure Evaluation (Addendum)
Anesthesia Evaluation  Patient identified by MRN, date of birth, ID band Patient awake    Reviewed: Allergy & Precautions, H&P , NPO status , Patient's Chart, lab work & pertinent test results, reviewed documented beta blocker date and time   Airway Mallampati: II  TM Distance: >3 FB Neck ROM: full    Dental no notable dental hx.    Pulmonary former smoker,    Pulmonary exam normal breath sounds clear to auscultation       Cardiovascular hypertension, + CAD, + Past MI and + CABG  + dysrhythmias Supra Ventricular Tachycardia + Valvular Problems/Murmurs AS  Rhythm:regular Rate:Normal  TTE (2015): AVA 1.69 cm2   Neuro/Psych  Headaches, Depression    GI/Hepatic hiatal hernia, GERD  ,  Endo/Other  diabetes, Type 2  Renal/GU stones     Musculoskeletal  (+) Arthritis ,   Abdominal   Peds  Hematology   Anesthesia Other Findings 100mg  propofol w/last GA-LMA   Hypertension      GERD      DM Mild aortic stenosis per last echo 04-23-2014  Valve Area 1.69 cm^2   Coronary artery disease cardiologist- dr Marlou Porch  s/p CABG x5 03/2008 - presentation acute STEMI   LAFB (left anterior fascicular block)    PSVT       Reproductive/Obstetrics                           Anesthesia Physical  Anesthesia Plan  ASA: III  Anesthesia Plan: MAC   Post-op Pain Management:    Induction: Intravenous  Airway Management Planned: Simple Face Mask  Additional Equipment:   Intra-op Plan:   Post-operative Plan:   Informed Consent: I have reviewed the patients History and Physical, chart, labs and discussed the procedure including the risks, benefits and alternatives for the proposed anesthesia with the patient or authorized representative who has indicated his/her understanding and acceptance.   Dental Advisory Given  Plan Discussed with: CRNA, Anesthesiologist and Surgeon  Anesthesia Plan  Comments:         Anesthesia Quick Evaluation

## 2016-07-02 ENCOUNTER — Encounter (HOSPITAL_BASED_OUTPATIENT_CLINIC_OR_DEPARTMENT_OTHER): Payer: Self-pay | Admitting: Urology

## 2016-07-02 NOTE — Op Note (Signed)
Seth Simpson, Seth Simpson                ACCOUNT NO.:  [id number]  MEDICAL RECORD NO.:  [id number]  LOCATION:                                 FACILITY:  PHYSICIAN:  Marshall Cork. Jeffie Pollock, M.D.    DATE OF BIRTH:  07-Aug-1931  DATE OF PROCEDURE:  07/01/2016 DATE OF DISCHARGE:                              OPERATIVE REPORT   PATIENT OF:  Marshall Cork. Jeffie Pollock, M.D.  PROCEDURE: 1. Cystoscopy with removal of bilateral double-J stents. 2. Left ureteroscopic stone extraction with holmium laser lithotripsy     of left distal stone. 3. Right ureteroscopic stone extraction with holmium laser lithotripsy     and insertion of right double-J stent for right proximal ureteral     stone. 4. Ureteroscopic extraction of multiple right lower pole stones.  PREOPERATIVE DIAGNOSIS:  Left distal and right proximal ureteral stones.  POSTOPERATIVE DIAGNOSIS:  Left distal and right proximal ureteral stones with a right renal stones.  SURGEON:  Marshall Cork. Jeffie Pollock, M.D.  ANESTHESIA:  General.  SPECIMEN:  Stone fragments.  DRAINS:  Right 6-French x 26 cm double-J stent with tether.  BLOOD LOSS:  Minimal.  COMPLICATIONS:  None.  INDICATIONS:  [name] is an 82 year old white male who was recently seen for bilateral ureteral stones.  He underwent an initial attempt at ureteroscopy but has a large prostate with marked J-hooking and some bladder wall thickening and fibrosis.  I was able to get stents up but was unable to dilate the ureters to perform ureterostomy at that time. So, he returns today with stents in place for ureteroscopy.  Of note, during the initial procedure, he was found to have two papillary bladder tumors that were biopsied, fulgurated, and found to be low grade.  He additionally underwent fulguration of prostatic urethral mucosa for bleeding.  FINDINGS AND PROCEDURE:  He was given antibiotics.  He was taken to the operating room where general anesthetic was induced.  He was placed in a lithotomy  position and was fitted with PAS hose.  His perineum and genitalia were prepped with Betadine solution.  He was draped in usual sterile fashion.  Cystoscopy was performed using the 23-French scope and 30-degree lens. Examination revealed a normal urethra.  The external sphincter was intact.  The prostatic urethra was approximately 5 cm in length with trilobar hyperplasia.  There was healing mucosal fulguration at the bladder neck over the middle lobe and also on the left prostatic lobe.  Once in the bladder, the left ureteral stent was grasped and pulled to the urethral meatus.  A guidewire was then passed to the kidney.  At this point, the dual lumen semirigid ureteroscope was passed alongside the wire.  A second Sensor wire through the scope was required to cannulate the angulated distal ureter.  He was noted to have a large lead stone, approximately 6 mm in size and smaller more proximal stones. The stone was engaged with a 365 micron laser fiber, initially was set on 0.8 watts and 20 hertz, but eventually I backed it down to 5 hertz but 2 watts because of the durability of the stone.  The stone was then engaged again and was broken  into regional fragments.  At this point, because of the angulation of the ureter and the difficulty cannulating the ureter without the aid of an additional wire as a filiform, I left the second wire up and then removed the ureteroscope.  A 38 cm digital access sheath was then passed over the wire into the distal ureter, and the wire and inner core were removed. The dual lumen digital flexible ureteroscope was then used to retrieve multiple fragments from the distal ureter, and the sheath was then removed.  The semi rigid ureteroscope was replaced, and then an additional fragment was identified and was removed with an NGage basket. The NCompass and NGage baskets were used primarily.  At this point, the ureter was free of all but the finest sand.  The  access sheath was then placed over the remaining wire, and the wire was removed.  It was not felt that a stent was indicated.  I then reinserted the cystoscope and pulled the right ureteral stent to the urethral meatus.  A guidewire was passed to the kidney through the stent, and the stent was removed.  A 38 cm digital access sheath was then inserted over the wire to the proximal ureter.  The inner core was removed leaving the wire in place, and a second wire was then passed to the kidney.  The access sheath was then removed and then replaced over a single wire leaving a safety wire outside the sheath.  The stone in the proximal ureter which was approximately 9 mm was visualized and was engaged with a 365 micron laser fiber through the dual lumen flexible scope.  I was able to break it into manageable fragments but several of those flushed into the kidney.  An NGage basket was then used to remove the fragments that remained in the ureter as well as from the kidney.  Of note, in addition to the fragments from the ureteral stone, he had several small mulberry shaped stones in the lower and mid pole calices which were removed with the basket as well with every attempt made to remove all but the fine sand.  Once all stones had been removed, the ureteroscope was removed under direct vision.  He was noted to have some bleeding with some clots in the ureter.  It was felt the replacement of the stent was indicated.  Once the ureteroscope and sheath had been removed, the cystoscope was reinserted over the wire, and a 6-French 26 cm contour double-J stent with tether was passed over the wire to the kidney.  The wire was removed leaving good coil in the kidney and a good coil in the bladder. The bladder was drained.  The cystoscope was removed leaving the stent string exiting the urethra.  The string was secured to the patient's penis.  The patient had come in with a Foley catheter in place;  but I felt at this point that we could see how he did without the catheter since he did not have a history of significant voiding difficulty prior to his last procedure.  There were no complications.  The patient was given the stone fragments.     Marshall Cork. Jeffie Pollock, M.D.   ______________________________ Marshall Cork. Jeffie Pollock, M.D.    JJW/MEDQ  D:  07/01/2016  T:  07/01/2016  Job:  [id number]

## 2016-07-04 NOTE — Anesthesia Postprocedure Evaluation (Signed)
Anesthesia Post Note  Patient: Seth Simpson  Procedure(s) Performed: Procedure(s) (LRB): BILATERAL URETEROSCOPY STONE EXTRACTIONS WITH POSSIBLE STENT PLACEMENT (Bilateral) HOLMIUM LASER APPLICATION (Bilateral)  Patient location during evaluation: PACU Anesthesia Type: General Level of consciousness: sedated Pain management: satisfactory to patient Vital Signs Assessment: post-procedure vital signs reviewed and stable Respiratory status: spontaneous breathing Cardiovascular status: stable Anesthetic complications: no     Last Vitals:  Vitals:   07/01/16 1230 07/01/16 1330  BP: 113/64 (!) 121/54  Pulse: 62 61  Resp: 14 12  Temp:  36.7 C    Last Pain:  Vitals:   07/02/16 1145  TempSrc:   PainSc: 4    Pain Goal: Patients Stated Pain Goal: 5 (07/01/16 0844)               [name]

## 2016-11-18 ENCOUNTER — Encounter: Payer: Self-pay | Admitting: Cardiology

## 2016-11-18 ENCOUNTER — Ambulatory Visit (INDEPENDENT_AMBULATORY_CARE_PROVIDER_SITE_OTHER): Payer: Medicare PPO | Admitting: Cardiology

## 2016-11-18 VITALS — BP 128/80 | HR 66 | Ht 69.0 in | Wt 141.6 lb

## 2016-11-18 DIAGNOSIS — I2510 Atherosclerotic heart disease of native coronary artery without angina pectoris: Secondary | ICD-10-CM | POA: Diagnosis not present

## 2016-11-18 DIAGNOSIS — I350 Nonrheumatic aortic (valve) stenosis: Secondary | ICD-10-CM | POA: Diagnosis not present

## 2016-11-18 DIAGNOSIS — I252 Old myocardial infarction: Secondary | ICD-10-CM

## 2016-11-18 DIAGNOSIS — E7800 Pure hypercholesterolemia, unspecified: Secondary | ICD-10-CM

## 2016-11-18 DIAGNOSIS — I471 Supraventricular tachycardia: Secondary | ICD-10-CM

## 2016-11-18 NOTE — Patient Instructions (Addendum)

## 2016-11-18 NOTE — Progress Notes (Signed)
[facility]. [address] [place], [place]  [address] Phone: [phone] Fax:  [phone]  Date:  11/18/2016   ID:  Seth Simpson, DOB 1931/11/15, MRN [id number]  PCP:  Henrine Screws, MD   History of Present Illness: Seth Simpson is a 82 y.o. male with coronary artery disease status post bypass surgery in April of 2009, prior myocardial infarction/inferior, history of anemia, left anterior fascicular block, PVCs, minimal aortic stenosis with possible bicuspid valve with hyperlipidemia here for followup.  Admitted on 11/20/14 with incessant SVT. Wide-complex tachycardia at his primary physician's all this. Terminated rapid rhythm with carotid sinus massage as well as adenosine as well as diltiazem. Tachycardia then returned. He was placed on IV amiodarone and considered catheter ablation. Dr. Caryl Comes and Dr. Rayann Heman both saw him. Preferred medical approach at this time. Oral amiodarone. Stable. Saw Richardson Dopp on 12/18/14. He has had pulmonary function studies with primary physician.  Dr. Inda Merlin reduced his Crestor frequency.  Doing well with three times weekly Crestor.  Denies any syncope, shortness of breath, chest pain, palpitations, PND, orthopnea.  He occasionally gets lightheaded if he stands too quickly. Stable.  He is less active outside of the home because he is taking care of his wife with advancing dementia.   Bilateral ureteroscopy by Dr. Shirlyn Goltz of urology on 07/01/16.  Studies:  - LHC (4/09): LAD 50-70%, ostial/proximal D1 70-75%, proximal RI 95%, CFX subtotally occluded, RCA 95% >> CABG - Echo (5/15): Mild LVH, EF 60-65%, normal wall motion, mild AS (mean 12 mmHg) , mild MR   Recent Labs: 11/20/2014: ALT 13; TSH 2.740 11/21/2014: BUN 19; Creatinine 1.04; Hemoglobin 11.8*; LDL (calc) 44; Potassium 4.4; Sodium 141  05/2015 -  Wt Readings from Last 3 Encounters:  11/18/16 141 lb 9.6 oz (64.2 kg)  07/01/16 133 lb (60.3 kg)  06/19/16 135 lb (61.2 kg)     Past Medical History:  Diagnosis Date  . Bicuspid aortic valve   . Bilateral ureteral calculi   . BPH (benign prostatic hypertrophy)   . Coronary artery disease cardiologist-  dr Marlou Porch   a. s/p CABG  x5  03/2008 - presentation acute STEMI  . Depression   . Erectile dysfunction   . Foley catheter in place   . GERD (gastroesophageal reflux disease)   . Gout    per pt 06-09-2016 stable  . Heart murmur   . Hematuria   . Hiatal hernia   . History of bladder stone   . History of orchitis   . History of prostatitis   . History of ST elevation myocardial infarction (STEMI)    04-10-2008  acute inferior  . History of syncope    2011 sinoatrial node dysfunction  and 11-20-2014 presyncope w/ wide complex tachycardia resolved  w/ valvasa and adenosine  . Hyperlipidemia   . Hypertension   . Iron deficiency anemia   . LAFB (left anterior fascicular block)   . Lower urinary tract symptoms (LUTS)   . Mild aortic stenosis    per last echo 04-23-2014  Valve Area 1.69 cm^2  . Nephrolithiasis    bilateral non-obstructive per ct 06-04-2016  . OA (osteoarthritis)   . PSVT (paroxysmal supraventricular tachycardia) ([place])   . PVC's (premature ventricular contractions)   . Rheumatoid arthritis ([place]) 10/02/2013   Dr. Lenna Gilford, methotrexate  . S/P CABG x 5    04-11-2008  . Sigmoid diverticulosis   . Type II diabetes mellitus ([place])   .  Wears glasses   . Wears hearing aid    bilateral    Past Surgical History:  Procedure Laterality Date  . APPENDECTOMY  1954  . BALLOON DILATION  04/06/2012   Procedure: BALLOON DILATION;  Surgeon: Garlan Fair, MD;  Location: [facility];  Service: Endoscopy;  Laterality: N/A;  . CARDIAC CATHETERIZATION  04-10-2008  dr Daneen Schick   3 vessel CAD  . CATARACT EXTRACTION W/ INTRAOCULAR LENS  IMPLANT, BILATERAL Bilateral 2003  . CHOLECYSTECTOMY OPEN  1978  . COLONOSCOPY  04/06/2012   Procedure: COLONOSCOPY;  Surgeon: Garlan Fair, MD;  Location: [facility];  Service: Endoscopy;  Laterality: N/A;  . CORONARY ARTERY BYPASS GRAFT  04-11-2008   LIMA to RI,  SVG to D1,  SVG to LAD,  sequetial SVG to Acute Marginal and pRCA  . CYSTOSCOPY WITH BIOPSY N/A 06/17/2016   Procedure: CYSTOSCOPY WITH BIOPSY;  Surgeon: Irine Seal, MD;  Location: [facility];  Service: Urology;  Laterality: N/A;  . CYSTOSCOPY WITH LITHOLAPAXY N/A 06/17/2016   Procedure: CYSTOSCOPY WITH LITHOLAPAXY;  Surgeon: Irine Seal, MD;  Location: [facility];  Service: Urology;  Laterality: N/A;  . CYSTOSCOPY WITH RETROGRADE PYELOGRAM, URETEROSCOPY AND STENT PLACEMENT  06-17-2016  dr Irine Seal   Cysto/  Bilateral RTG's/  Bladder bx and fulgeration bladder tumor's left & right lateral wall's/  Litholapaxy bladder stone/  left ureteroscopy/  Insertion bilateral double J stents  . CYSTOSCOPY WITH RETROGRADE PYELOGRAM, URETEROSCOPY AND STENT PLACEMENT Bilateral 06/17/2016   Procedure: CYSTOSCOPY BILATERAL RETROGRADE,  LEFT URETEROSCOPY AND BILATERAL STENT PLACEMENT;  Surgeon: Irine Seal, MD;  Location: [facility];  Service: Urology;  Laterality: Bilateral;  . CYSTOSCOPY WITH URETEROSCOPY AND STENT PLACEMENT Bilateral 07/01/2016   Procedure: BILATERAL URETEROSCOPY STONE EXTRACTIONS WITH POSSIBLE STENT PLACEMENT;  Surgeon: Irine Seal, MD;  Location: [facility];  Service: Urology;  Laterality: Bilateral;  . ESOPHAGOGASTRODUODENOSCOPY  04/06/2012   Procedure: ESOPHAGOGASTRODUODENOSCOPY (EGD);  Surgeon: Garlan Fair, MD;  Location: [facility];  Service: Endoscopy;  Laterality: N/A;  . FINGER SURGERY Left 2000's   "removed BB", 2nd digit  . HOLMIUM LASER APPLICATION N/A 0000000   Procedure: HOLMIUM LASER APPLICATION;  Surgeon: Irine Seal, MD;  [facility];  Service: Urology;  Laterality: N/A;  . HOLMIUM LASER APPLICATION Bilateral [id number]   Procedure: HOLMIUM LASER APPLICATION;  Surgeon: Irine Seal, MD;   Location: [facility];  Service: Urology;  Laterality: Bilateral;  . TOTAL KNEE ARTHROPLASTY Bilateral right 03-20-2008//  left 08-23-2007  . TRANSTHORACIC ECHOCARDIOGRAM  04-23-2014  dr Marlou Porch   mild LVH,  ef 60-65%/  Bicuspid AV with stenosis, valve area 1.69 cm^2/  mild MR and TR    Current Outpatient Prescriptions  Medication Sig Dispense Refill  . ACCU-CHEK AVIVA PLUS test strip     . ACCU-CHEK SOFTCLIX LANCETS lancets USE TO TEST BLOOD SUGAR DAILY  12  . acetaminophen (TYLENOL) 500 MG tablet Take 500 mg by mouth every 6 (six) hours as needed for mild pain, moderate pain, fever or headache.    . allopurinol (ZYLOPRIM) 300 MG tablet Take 300 mg by mouth every evening.     Marland Kitchen amiodarone (PACERONE) 200 MG tablet Take 1 tablet (200 mg total) by mouth daily. 90 tablet 2  . amLODipine-benazepril (LOTREL) 5-20 MG per capsule Take 1 capsule by mouth every evening.     Marland Kitchen aspirin 81 MG tablet Take 81 mg by mouth every morning.     Marland Kitchen  CHERRY CONCENTRATE PO Take 1 tablet by mouth daily. OTC-Tart Cherry Extract tablet    . colchicine 0.6 MG tablet Take 0.6 mg by mouth daily as needed (gout).     Marland Kitchen diphenhydrAMINE (BENADRYL) 25 MG tablet Take 25 mg by mouth daily as needed for allergies. Reported on 06/19/2016    . ferrous sulfate 325 (65 FE) MG tablet Take 325 mg by mouth 2 (two) times daily.    . finasteride (PROSCAR) 5 MG tablet Take 5 mg by mouth every evening.     . Flaxseed, Linseed, (FLAXSEED OIL MAX STR) 1300 MG CAPS Take 1 capsule by mouth every evening.     . folic acid (FOLVITE) 1 MG tablet Take 1 mg by mouth every morning.     Marland Kitchen HYDROcodone-acetaminophen (NORCO/VICODIN) 5-325 MG tablet Take 1 tablet by mouth every 6 (six) hours as needed for moderate pain. 20 tablet 0  . loratadine (CLARITIN) 10 MG tablet Take 10 mg by mouth every morning.     . metFORMIN (GLUCOPHAGE) 500 MG tablet Take 500 mg by mouth 2 (two) times daily with a meal.     . methotrexate (RHEUMATREX) 2.5 MG  tablet Take 15 mg by mouth every Friday. Take on Fridays.  Caution:Chemotherapy. Protect from light.    . metoprolol succinate (TOPROL-XL) 25 MG 24 hr tablet Take 1 tablet (25 mg total) by mouth daily. 30 tablet 6  . Multiple Vitamin (MULTIVITAMIN WITH MINERALS) TABS tablet Take 1 tablet by mouth every evening.     . naproxen (NAPRELAN) 500 MG 24 hr tablet Take 500 mg by mouth daily as needed (Pain).     Marland Kitchen omeprazole (PRILOSEC OTC) 20 MG tablet Take 10 mg by mouth every morning.     . ondansetron (ZOFRAN ODT) 4 MG disintegrating tablet Take 1 tablet (4 mg total) by mouth every 8 (eight) hours as needed for nausea. 6 tablet 0  . rosuvastatin (CRESTOR) 10 MG tablet Take 10 mg by mouth every Monday, Wednesday, and Friday.     . sertraline (ZOLOFT) 100 MG tablet Take 50 mg by mouth every morning.     . sildenafil (VIAGRA) 100 MG tablet Take 100 mg by mouth daily as needed for erectile dysfunction.    . tamsulosin (FLOMAX) 0.4 MG CAPS capsule Take 1 capsule (0.4 mg total) by mouth daily. 7 capsule 0  . terazosin (HYTRIN) 5 MG capsule Take 5 mg by mouth at bedtime.     . triamcinolone (NASACORT ALLERGY 24HR) 55 MCG/ACT AERO nasal inhaler Place 2 sprays into the nose daily as needed (Nasal congestion).     No current facility-administered medications for this visit.     Allergies:    Allergies  Allergen Reactions  . Peanut-Containing Drug Products Anaphylaxis, Shortness Of Breath and Swelling    Peanuts... Anaphylaxis  . Hydrochlorothiazide Other (See Comments)    Avoids , flares up Gout  . Other Diarrhea    Splenda  . Pistachio Nut (Diagnostic) Swelling and Other (See Comments)    gout  . Codeine Itching and Rash  . Fruit & Vegetable Daily [Nutritional Supplements] Rash    Papaya    Social History:  The patient  reports that he quit smoking about 52 years ago. His smoking use included Cigarettes. He has a 15.00 pack-year smoking history. He has never used smokeless tobacco. He reports that  he drinks about 1.2 oz of alcohol per week . He reports that he does not use drugs.    Family History:  The patient's family history includes Heart disease in his father.    ROS:  Please see the history of present illness.  No syncope, no bleeding, no palps. Has been dx with RA.       PHYSICAL EXAM: VS:  BP 128/80   Pulse 66   Ht 5\' 9"  (1.753 m)   Wt 141 lb 9.6 oz (64.2 kg)   BMI 20.91 kg/m  Well nourished, well developed, in no acute distress  HEENT: normal  Neck: no JVD  Cardiac:  normal S1, Q000111Q; 2/6 systolic murmur with radiation to carotids.  Lungs:  clear to auscultation bilaterally, no wheezing, rhonchi or rales  Abd: soft, nontender, no hepatomegaly  Ext: no edema  Skin: warm and dry  Neuro: no focal abnormalities noted  EKG:  10/22/14-sinus bradycardia rate 58 with frequent PVCs in bigeminy pattern, left axis deviation. Septal infarct pattern.-Prior Sinus rhythm, left anterior fascicular block, nonspecific ST T-wave changes, no significant change from prior EKG. Echocardiogram 2013: Mild aortic stenosis, mean gradient 13 mm mercury      ASSESSMENT AND PLAN:   1. CAD s/p CABG in 2009 - Doing very well.  Compliant with medications.  Continue current regimen, no anginal symptoms. 2. RA- followed by Dr. Lenna Gilford. On Methotrexate. Monitoring liver function. He is doing well. Note that he is also on amiodarone. These 2 medications do have potential liver toxicity side effects. Continue closely monitoring.  June 2016 LFTs normal. Rheumatology has been following blood work.  3. HTN-was higher in the am hours. Currently under great control. He is not dizzy currently. 4. Paroxysmal supraventricular tachycardia-currently well controlled with amiodarone. This is after hospitalization in December 2015. He was seen by EP and has opted for noninvasive management. Prior episode of PSVT occurred post endoscopy. Valsalva maneuver.  Continue amiodarone and metoprolol. 5. Anterior fascicular  block-currently monitoring, no syncope. 6. Sinoatrial node dysfunction-prior bradycardia. No significant symptoms currently. 7. Frequent PVCs-bigeminy pattern previously seen. Amiodarone has halted PVCs.  Low-dose metoprolol. 8. Old myocardial infarction-inferior wall 9. Coronary artery disease-prior bypass surgery 2009-secondary prevention 10. Mild aortic stenosis-continue to monitor, echocardiogram 2015 with mild aortic stenosis. Murmur appreciated. I will repeat echocardiogram 11. Hyperlipidemia-Crestor. LDL GOAL 70. Dr. Inda Merlin has been monitoring. 12. Possible Congenital bicuspid aortic valve 13. 1 year follow-up. Continue to monitor liver function, thyroid.  Signed, Candee Furbish, MD [facility]  11/18/2016 9:25 AM

## 2016-12-09 ENCOUNTER — Other Ambulatory Visit: Payer: Self-pay

## 2016-12-09 ENCOUNTER — Ambulatory Visit (HOSPITAL_COMMUNITY): Payer: Medicare PPO | Attending: Cardiovascular Disease

## 2016-12-09 DIAGNOSIS — I2510 Atherosclerotic heart disease of native coronary artery without angina pectoris: Secondary | ICD-10-CM | POA: Diagnosis not present

## 2016-12-09 DIAGNOSIS — I350 Nonrheumatic aortic (valve) stenosis: Secondary | ICD-10-CM

## 2016-12-09 DIAGNOSIS — I082 Rheumatic disorders of both aortic and tricuspid valves: Secondary | ICD-10-CM | POA: Diagnosis not present

## 2016-12-09 DIAGNOSIS — I10 Essential (primary) hypertension: Secondary | ICD-10-CM | POA: Diagnosis not present

## 2016-12-09 DIAGNOSIS — Z87891 Personal history of nicotine dependence: Secondary | ICD-10-CM | POA: Diagnosis not present

## 2016-12-09 DIAGNOSIS — I252 Old myocardial infarction: Secondary | ICD-10-CM | POA: Diagnosis not present

## 2016-12-28 ENCOUNTER — Other Ambulatory Visit: Payer: Self-pay | Admitting: Cardiology

## 2017-06-30 ENCOUNTER — Telehealth: Payer: Self-pay | Admitting: Cardiology

## 2017-06-30 NOTE — Telephone Encounter (Signed)
New message         [facility] Pre-operative Risk Assessment    Request for surgical clearance:  1. What type of surgery is being performed? Bladder tumor  When is this surgery scheduled?  Pending clearance 2. Are there any medications that need to be held prior to surgery and how long?  Need cardiac clearance  Name of physician performing surgery?  Dr Jeffie Pollock 3. What is your office phone and fax number?  Fax [phone]  Seth Simpson Price 06/30/2017, 10:19 AM  _________________________________________________________________   (provider comments below)

## 2017-07-04 NOTE — Telephone Encounter (Signed)
Will fax this surgical clearance per Dr Marlou Porch, to contact information provided.   Routed surgical clearance via faxed through  [facility].

## 2017-07-04 NOTE — Telephone Encounter (Signed)
He may proceed with urologic surgery with moderate cardiovascular risk (prior CABG, age). No medications need to be stopped.   Candee Furbish, MD

## 2017-07-07 ENCOUNTER — Other Ambulatory Visit: Payer: Self-pay | Admitting: Urology

## 2017-07-11 ENCOUNTER — Other Ambulatory Visit: Payer: Self-pay | Admitting: Internal Medicine

## 2017-07-11 DIAGNOSIS — M6281 Muscle weakness (generalized): Secondary | ICD-10-CM

## 2017-07-11 DIAGNOSIS — R42 Dizziness and giddiness: Secondary | ICD-10-CM

## 2017-07-12 ENCOUNTER — Ambulatory Visit
Admission: RE | Admit: 2017-07-12 | Discharge: 2017-07-12 | Disposition: A | Discharge status: Home / Self Care | Payer: Medicare PPO | Source: Ambulatory Visit | Attending: Internal Medicine | Admitting: Internal Medicine

## 2017-07-12 DIAGNOSIS — R42 Dizziness and giddiness: Secondary | ICD-10-CM

## 2017-07-12 DIAGNOSIS — M6281 Muscle weakness (generalized): Secondary | ICD-10-CM

## 2017-07-13 ENCOUNTER — Encounter (HOSPITAL_BASED_OUTPATIENT_CLINIC_OR_DEPARTMENT_OTHER): Payer: Self-pay | Admitting: *Deleted

## 2017-07-14 ENCOUNTER — Encounter (HOSPITAL_BASED_OUTPATIENT_CLINIC_OR_DEPARTMENT_OTHER): Payer: Self-pay | Admitting: *Deleted

## 2017-07-14 NOTE — Progress Notes (Signed)
NPO AFTER MN.  ARRIVE AT 0600.  NEEDS ISTAT 8 AND EKG.  WILL TAKE AMIODARONE, METOPROLOL, SERTERLINE, AND PRILOSEC AM DOS W/ SIPS OF WATER.

## 2017-07-25 NOTE — H&P (Signed)
CC/HPI: CC: Hematuria.   Hx: [name] returns today in f/u for cystoscopy. He had hematuria on his recent UA and a possible 8mm bladder stone. He his to have cystoscopy.   He has a history of urolithiasis and had staged ureteroscopy last year for bilateral ureteral stones and he also had cystolithalopaxy for bladder stones.   He had a small low grade NMIBC on his cystosocpy in 7/17. he has not had f/u cystoscopy. He has hematuria today.   He has BPH with BOO and remains on finasteride and tamsulosin.     ALLERGIES: Papaya Peanuts Pistachios    MEDICATIONS: Aspirin 81 mg tablet, chewable 1 tablet PO Daily  Finasteride 5 mg tablet 1 tablet PO Daily  Metoprolol Succinate 25 mg tablet, extended release 24 hr 1 tablet PO Daily  Omeprazole 20 mg capsule,delayed release 1 capsule PO Daily  Tamsulosin Hcl 0.4 mg capsule, ext release 24 hr 1 capsule PO Daily  Cherry Concentrate 1 PO Daily  Colchicine 0.6 mg capsule 1 capsule PO Daily  Diphenhydramine Hcl 25 mg capsule 1 capsule PO Daily  Ferrous Sulfate 325 mg (65 mg iron) tablet 1 tablet PO Daily  Flaxseed Oil 1 PO Daily  Folic Acid 1 mg tablet 1 tablet PO Daily  Loratadine 10 mg tablet 1 tablet PO Daily  Methotrexate 2.5 mg tablet 1 tablet PO Daily  Multivitamin 1 PO Daily  Naproxen 500 mg tablet 1 tablet PO Daily  Nasacort 55 mcg aerosol, spray 1 PO Daily  Rosuvastatin Calcium 10 mg tablet 1 tablet PO Daily  Sertraline Hcl 100 mg tablet 1 tablet PO Daily     GU PSH: Cysto Bladder Stone <2.5cm - 06/17/2016 Cysto Uretero Lithotripsy, Left - 07/01/2016 Cystoscopy Insert Stent, Bilateral - 06/17/2016 Cystoscopy TURBT <2 cm - 06/17/2016, 06/17/2016 Cystoscopy Ureteroscopy, Left - 06/17/2016 Locm 300-399Mg /Ml Iodine,1Ml - 05/21/2016 Ureteroscopic laser litho, Right - 07/01/2016 Ureteroscopic stone removal, Right - 07/01/2016    NON-GU PSH: Coronary Artery Bypass Grafting - about 2009    GU PMH: Renal cyst, Bilateral - 08/27/2016 History  of bladder cancer - 06/23/2016 Bladder Stone, He has multiple bladder stones. - 06/07/2016 BPH w/LUTS (Stable), He has a large prostate but is voiding well on medical therapy. - 06/07/2016 Ureteral calculus, Bilateral, He has left distal stones and right proximal stones with minimal symptoms. - 06/07/2016 Other microscopic hematuria, Hematuria, microscopic - 05/05/2016 Weak Urinary Stream, Weak urinary stream - 05/05/2016 Renal calculus    NON-GU PMH: Encounter for general adult medical examination without abnormal findings, Encounter for preventive health examination - 05/05/2016 Diabetes Type 2 Gout Hypertension Rheumatoid Arthritis    FAMILY HISTORY: Arthritis - Mother Heart Attack - Father   SOCIAL HISTORY: Marital Status: Married Current Smoking Status: Patient does not smoke anymore.  Does not drink caffeine. Has not had a blood transfusion. Patient's occupation is/was retired.    REVIEW OF SYSTEMS:    GU Review Male:   Patient denies frequent urination, hard to postpone urination, burning/ pain with urination, get up at night to urinate, leakage of urine, stream starts and stops, trouble starting your stream, have to strain to urinate , erection problems, and penile pain.  Gastrointestinal (Upper):   Patient denies nausea, vomiting, and indigestion/ heartburn.  Gastrointestinal (Lower):   Patient denies diarrhea and constipation.  Constitutional:   Patient denies fever, night sweats, weight loss, and fatigue.  Skin:   Patient denies skin rash/ lesion and itching.  Eyes:   Patient denies blurred vision and double  vision.  Ears/ Nose/ Throat:   Patient denies sore throat and sinus problems.  Hematologic/Lymphatic:   Patient reports easy bruising. Patient denies swollen glands.  Cardiovascular:   Patient denies leg swelling and chest pains.  Respiratory:   Patient denies cough and shortness of breath.  Endocrine:   Patient denies excessive thirst.  Musculoskeletal:   Patient denies  back pain and joint pain.  Neurological:   Patient denies headaches and dizziness.  Psychologic:   Patient denies depression and anxiety.   VITAL SIGNS:      06/29/2017 01:30 PM  Weight 140 lb / 63.5 kg  Height 68 in / 172.72 cm  BP 109/67 mmHg  Pulse 61 /min  Temperature 97.5 F / 36 C  BMI 21.3 kg/m   MULTI-SYSTEM PHYSICAL EXAMINATION:    Constitutional: Well-nourished. No physical deformities. Normally developed. Good grooming.  Respiratory: No labored breathing, CTA  Cardiovascular: Normal temperature, RRR with SEM murmur     PAST DATA REVIEWED:  Source Of History:  Patient  Urine Test Review:   Urinalysis   PROCEDURES:         Cysto Dilate Meatus Leander Rams Sounds [phone]  Risks, benefits, and some of the potential complications of the procedure were discussed. 66ml of 2% lidocaine jelly was instilled intraurethrally.  Cipro 500mg  given for antibiotic prophylaxis.    Meatus:  Normal size. Normal location. Normal condition.  Urethra:  Mild fossa navicularis stricture.  External Sphincter:  Normal.  Verumontanum:  Normal.  Prostate:  Obstructing. Enlarged median lobe. Moderate hyperplasia.  Bladder Neck:  Non-obstructing.  Ureteral Orifices:  Normal location. Normal size. Normal shape. Effluxed clear urine.  Bladder:  Moderate trabeculation. A left lateral wall tumor. 1/2 cm tumor. Normal mucosa. No stones.    Leander Rams sounds were used to dilate the meatus from 18 [place] to 20 [place].  The procedure was well tolerated and there were no complications.   ASSESSMENT:      ICD-10 Details  1 GU:   Bladder Cancer Lateral - C67.2 He has a couple of small recurrent tumors on the left lateral wall. I am going to get him set up for cystoscopy with RTG's and biopsy with fulguration. I didn't see a bladder stone today.   2   History of bladder cancer - Z85.51   3   Microscopic hematuria - R31.21   4   Meatal stenosis (other urethral stricture) - N35.8 He has a moderate  meatal/fossa stricture that I dilated.    PLAN:            Medications Stop Meds: Viagra 100 mg tablet 1 tablet PO Daily  Discontinue: 06/29/2017  - Reason: The medication cycle was completed.            Schedule Return Visit/Planned Activity: Next Available Appointment - Schedule Surgery

## 2017-07-26 ENCOUNTER — Ambulatory Visit (HOSPITAL_BASED_OUTPATIENT_CLINIC_OR_DEPARTMENT_OTHER): Payer: Medicare PPO | Admitting: Anesthesiology

## 2017-07-26 ENCOUNTER — Encounter (HOSPITAL_BASED_OUTPATIENT_CLINIC_OR_DEPARTMENT_OTHER)
Admission: RE | Disposition: A | Discharge status: Home / Self Care | Payer: Self-pay | Source: Ambulatory Visit | Attending: Urology

## 2017-07-26 ENCOUNTER — Encounter (HOSPITAL_BASED_OUTPATIENT_CLINIC_OR_DEPARTMENT_OTHER): Payer: Self-pay | Admitting: *Deleted

## 2017-07-26 ENCOUNTER — Ambulatory Visit (HOSPITAL_BASED_OUTPATIENT_CLINIC_OR_DEPARTMENT_OTHER)
Admission: RE | Admit: 2017-07-26 | Discharge: 2017-07-26 | Disposition: A | Discharge status: Home / Self Care | Payer: Medicare PPO | Source: Ambulatory Visit | Attending: Urology | Admitting: Urology

## 2017-07-26 DIAGNOSIS — Z91010 Allergy to peanuts: Secondary | ICD-10-CM | POA: Insufficient documentation

## 2017-07-26 DIAGNOSIS — Z87442 Personal history of urinary calculi: Secondary | ICD-10-CM | POA: Diagnosis not present

## 2017-07-26 DIAGNOSIS — R3129 Other microscopic hematuria: Secondary | ICD-10-CM | POA: Diagnosis not present

## 2017-07-26 DIAGNOSIS — M109 Gout, unspecified: Secondary | ICD-10-CM | POA: Insufficient documentation

## 2017-07-26 DIAGNOSIS — Z8249 Family history of ischemic heart disease and other diseases of the circulatory system: Secondary | ICD-10-CM | POA: Insufficient documentation

## 2017-07-26 DIAGNOSIS — I447 Left bundle-branch block, unspecified: Secondary | ICD-10-CM | POA: Insufficient documentation

## 2017-07-26 DIAGNOSIS — Z91018 Allergy to other foods: Secondary | ICD-10-CM | POA: Diagnosis not present

## 2017-07-26 DIAGNOSIS — Z79899 Other long term (current) drug therapy: Secondary | ICD-10-CM | POA: Insufficient documentation

## 2017-07-26 DIAGNOSIS — F329 Major depressive disorder, single episode, unspecified: Secondary | ICD-10-CM | POA: Diagnosis not present

## 2017-07-26 DIAGNOSIS — M069 Rheumatoid arthritis, unspecified: Secondary | ICD-10-CM | POA: Diagnosis not present

## 2017-07-26 DIAGNOSIS — R3912 Poor urinary stream: Secondary | ICD-10-CM | POA: Insufficient documentation

## 2017-07-26 DIAGNOSIS — N138 Other obstructive and reflux uropathy: Secondary | ICD-10-CM | POA: Diagnosis not present

## 2017-07-26 DIAGNOSIS — Z87891 Personal history of nicotine dependence: Secondary | ICD-10-CM | POA: Diagnosis not present

## 2017-07-26 DIAGNOSIS — K449 Diaphragmatic hernia without obstruction or gangrene: Secondary | ICD-10-CM | POA: Diagnosis not present

## 2017-07-26 DIAGNOSIS — Z951 Presence of aortocoronary bypass graft: Secondary | ICD-10-CM | POA: Insufficient documentation

## 2017-07-26 DIAGNOSIS — N210 Calculus in bladder: Secondary | ICD-10-CM | POA: Diagnosis not present

## 2017-07-26 DIAGNOSIS — I10 Essential (primary) hypertension: Secondary | ICD-10-CM | POA: Diagnosis not present

## 2017-07-26 DIAGNOSIS — I252 Old myocardial infarction: Secondary | ICD-10-CM | POA: Insufficient documentation

## 2017-07-26 DIAGNOSIS — I2510 Atherosclerotic heart disease of native coronary artery without angina pectoris: Secondary | ICD-10-CM | POA: Diagnosis not present

## 2017-07-26 DIAGNOSIS — E118 Type 2 diabetes mellitus with unspecified complications: Secondary | ICD-10-CM | POA: Diagnosis not present

## 2017-07-26 DIAGNOSIS — N401 Enlarged prostate with lower urinary tract symptoms: Secondary | ICD-10-CM | POA: Diagnosis not present

## 2017-07-26 DIAGNOSIS — N359 Urethral stricture, unspecified: Secondary | ICD-10-CM | POA: Insufficient documentation

## 2017-07-26 DIAGNOSIS — Z7982 Long term (current) use of aspirin: Secondary | ICD-10-CM | POA: Insufficient documentation

## 2017-07-26 DIAGNOSIS — C672 Malignant neoplasm of lateral wall of bladder: Secondary | ICD-10-CM | POA: Insufficient documentation

## 2017-07-26 DIAGNOSIS — K219 Gastro-esophageal reflux disease without esophagitis: Secondary | ICD-10-CM | POA: Insufficient documentation

## 2017-07-26 DIAGNOSIS — D649 Anemia, unspecified: Secondary | ICD-10-CM | POA: Insufficient documentation

## 2017-07-26 HISTORY — PX: CYSTOSCOPY WITH BIOPSY: SHX5122

## 2017-07-26 HISTORY — PX: CYSTOSCOPY W/ RETROGRADES: SHX1426

## 2017-07-26 HISTORY — DX: Neoplasm of unspecified behavior of bladder: D49.4

## 2017-07-26 HISTORY — DX: Personal history of urinary calculi: Z87.442

## 2017-07-26 LAB — POCT I-STAT, CHEM 8
BUN: 27 mg/dL — ABNORMAL HIGH (ref 6–20)
CHLORIDE: 109 mmol/L (ref 101–111)
Calcium, Ion: 1.32 mmol/L (ref 1.15–1.40)
Creatinine, Ser: 1 mg/dL (ref 0.61–1.24)
GLUCOSE: 144 mg/dL — AB (ref 65–99)
HEMATOCRIT: 40 % (ref 39.0–52.0)
Hemoglobin: 13.6 g/dL (ref 13.0–17.0)
POTASSIUM: 4.3 mmol/L (ref 3.5–5.1)
SODIUM: 142 mmol/L (ref 135–145)
TCO2: 22 mmol/L (ref 0–100)

## 2017-07-26 SURGERY — CYSTOSCOPY, WITH BIOPSY
Anesthesia: General

## 2017-07-26 MED ORDER — ACETAMINOPHEN 325 MG PO TABS
650.0000 mg | ORAL_TABLET | ORAL | Status: DC | PRN
  Filled 2017-07-26: qty 2

## 2017-07-26 MED ORDER — EPHEDRINE 5 MG/ML INJ
INTRAVENOUS | Status: AC
  Filled 2017-07-26: qty 10

## 2017-07-26 MED ORDER — OXYCODONE HCL 5 MG PO TABS
5.0000 mg | ORAL_TABLET | ORAL | Status: DC | PRN
  Filled 2017-07-26: qty 2

## 2017-07-26 MED ORDER — FENTANYL CITRATE (PF) 100 MCG/2ML IJ SOLN
INTRAMUSCULAR | Status: AC
  Filled 2017-07-26: qty 2

## 2017-07-26 MED ORDER — ACETAMINOPHEN 650 MG RE SUPP
650.0000 mg | RECTAL | Status: DC | PRN
  Filled 2017-07-26: qty 1

## 2017-07-26 MED ORDER — ONDANSETRON HCL 4 MG/2ML IJ SOLN
INTRAMUSCULAR | Status: DC | PRN
  Administered 2017-07-26: 4 mg via INTRAVENOUS

## 2017-07-26 MED ORDER — EPHEDRINE SULFATE-NACL 50-0.9 MG/10ML-% IV SOSY
PREFILLED_SYRINGE | INTRAVENOUS | Status: DC | PRN
  Administered 2017-07-26: 10 mg via INTRAVENOUS

## 2017-07-26 MED ORDER — ONDANSETRON HCL 4 MG/2ML IJ SOLN
INTRAMUSCULAR | Status: AC
  Filled 2017-07-26: qty 2

## 2017-07-26 MED ORDER — SUCCINYLCHOLINE CHLORIDE 200 MG/10ML IV SOSY
PREFILLED_SYRINGE | INTRAVENOUS | Status: AC
  Filled 2017-07-26: qty 10

## 2017-07-26 MED ORDER — HYDROMORPHONE HCL 1 MG/ML IJ SOLN
0.2500 mg | INTRAMUSCULAR | Status: DC | PRN
  Filled 2017-07-26: qty 0.5

## 2017-07-26 MED ORDER — SODIUM CHLORIDE 0.9 % IV SOLN
250.0000 mL | INTRAVENOUS | Status: DC | PRN
  Filled 2017-07-26: qty 250

## 2017-07-26 MED ORDER — MEPERIDINE HCL 25 MG/ML IJ SOLN
6.2500 mg | INTRAMUSCULAR | Status: DC | PRN
  Filled 2017-07-26: qty 1

## 2017-07-26 MED ORDER — DEXAMETHASONE SODIUM PHOSPHATE 10 MG/ML IJ SOLN
INTRAMUSCULAR | Status: AC
  Filled 2017-07-26: qty 1

## 2017-07-26 MED ORDER — ONDANSETRON HCL 4 MG/2ML IJ SOLN
4.0000 mg | Freq: Once | INTRAMUSCULAR | Status: DC | PRN
  Filled 2017-07-26: qty 2

## 2017-07-26 MED ORDER — PROPOFOL 10 MG/ML IV BOLUS
INTRAVENOUS | Status: AC
  Filled 2017-07-26: qty 40

## 2017-07-26 MED ORDER — LIDOCAINE 2% (20 MG/ML) 5 ML SYRINGE
INTRAMUSCULAR | Status: DC | PRN
  Administered 2017-07-26: 80 mg via INTRAVENOUS

## 2017-07-26 MED ORDER — MORPHINE SULFATE (PF) 2 MG/ML IV SOLN
2.0000 mg | INTRAVENOUS | Status: DC | PRN
  Filled 2017-07-26: qty 1

## 2017-07-26 MED ORDER — LIDOCAINE 2% (20 MG/ML) 5 ML SYRINGE
INTRAMUSCULAR | Status: AC
  Filled 2017-07-26: qty 5

## 2017-07-26 MED ORDER — LACTATED RINGERS IV SOLN
INTRAVENOUS | Status: DC
  Administered 2017-07-26: 07:00:00 via INTRAVENOUS
  Filled 2017-07-26: qty 1000

## 2017-07-26 MED ORDER — SODIUM CHLORIDE 0.9% FLUSH
3.0000 mL | INTRAVENOUS | Status: DC | PRN
  Filled 2017-07-26: qty 3

## 2017-07-26 MED ORDER — PROPOFOL 10 MG/ML IV BOLUS
INTRAVENOUS | Status: DC | PRN
  Administered 2017-07-26: 150 mg via INTRAVENOUS

## 2017-07-26 MED ORDER — SODIUM CHLORIDE 0.9% FLUSH
3.0000 mL | Freq: Two times a day (BID) | INTRAVENOUS | Status: DC
  Filled 2017-07-26: qty 3

## 2017-07-26 MED ORDER — CEFAZOLIN SODIUM-DEXTROSE 2-4 GM/100ML-% IV SOLN
2.0000 g | INTRAVENOUS | Status: AC
  Administered 2017-07-26: 2 g via INTRAVENOUS
  Filled 2017-07-26: qty 100

## 2017-07-26 MED ORDER — DEXAMETHASONE SODIUM PHOSPHATE 4 MG/ML IJ SOLN
INTRAMUSCULAR | Status: DC | PRN
  Administered 2017-07-26: 10 mg via INTRAVENOUS

## 2017-07-26 MED ORDER — CEFAZOLIN SODIUM-DEXTROSE 2-4 GM/100ML-% IV SOLN
INTRAVENOUS | Status: AC
  Filled 2017-07-26: qty 100

## 2017-07-26 MED ORDER — FENTANYL CITRATE (PF) 100 MCG/2ML IJ SOLN
INTRAMUSCULAR | Status: DC | PRN
  Administered 2017-07-26: 50 ug via INTRAVENOUS

## 2017-07-26 MED ORDER — TRAMADOL HCL 50 MG PO TABS: 50.0000 mg | ORAL_TABLET | Freq: Four times a day (QID) | ORAL | 0 refills | Status: AC | PRN

## 2017-07-26 SURGICAL SUPPLY — 33 items
BAG DRAIN URO-CYSTO SKYTR STRL (DRAIN) ×3 IMPLANT
BASKET STONE 1.7 NGAGE (UROLOGICAL SUPPLIES) IMPLANT
BASKET ZERO TIP NITINOL 2.4FR (BASKET) IMPLANT
CATH FOLEY 2WAY SLVR  5CC 16FR (CATHETERS)
CATH FOLEY 2WAY SLVR 5CC 16FR (CATHETERS) IMPLANT
CATH URET 5FR 28IN CONE TIP (BALLOONS)
CATH URET 5FR 28IN OPEN ENDED (CATHETERS) IMPLANT
CATH URET 5FR 70CM CONE TIP (BALLOONS) IMPLANT
CLOTH BEACON ORANGE TIMEOUT ST (SAFETY) ×3 IMPLANT
ELECT REM PT RETURN 9FT ADLT (ELECTROSURGICAL) ×3
ELECTRODE REM PT RTRN 9FT ADLT (ELECTROSURGICAL) ×2 IMPLANT
FIBER LASER FLEXIVA 365 (UROLOGICAL SUPPLIES) IMPLANT
FIBER LASER TRAC TIP (UROLOGICAL SUPPLIES) IMPLANT
GLOVE SURG SS PI 8.0 STRL IVOR (GLOVE) ×3 IMPLANT
GOWN STRL REUS W/ TWL LRG LVL3 (GOWN DISPOSABLE) ×2 IMPLANT
GOWN STRL REUS W/ TWL XL LVL3 (GOWN DISPOSABLE) ×2 IMPLANT
GOWN STRL REUS W/TWL LRG LVL3 (GOWN DISPOSABLE) ×1
GOWN STRL REUS W/TWL XL LVL3 (GOWN DISPOSABLE) ×4 IMPLANT
GUIDEWIRE 0.038 PTFE COATED (WIRE) IMPLANT
GUIDEWIRE ANG ZIPWIRE 038X150 (WIRE) IMPLANT
GUIDEWIRE STR DUAL SENSOR (WIRE) ×3 IMPLANT
INFUSOR MANOMETER BAG 3000ML (MISCELLANEOUS) ×3 IMPLANT
IV NS IRRIG 3000ML ARTHROMATIC (IV SOLUTION) IMPLANT
KIT RM TURNOVER CYSTO AR (KITS) ×3 IMPLANT
MANIFOLD NEPTUNE II (INSTRUMENTS) ×3 IMPLANT
NDL SAFETY ECLIPSE 18X1.5 (NEEDLE) IMPLANT
NEEDLE HYPO 18GX1.5 SHARP (NEEDLE)
NEEDLE HYPO 22GX1.5 SAFETY (NEEDLE) IMPLANT
NS IRRIG 500ML POUR BTL (IV SOLUTION) IMPLANT
PACK CYSTO (CUSTOM PROCEDURE TRAY) ×3 IMPLANT
SYR 20CC LL (SYRINGE) IMPLANT
TUBE CONNECTING 12X1/4 (SUCTIONS) IMPLANT
WATER STERILE IRR 3000ML UROMA (IV SOLUTION) ×6 IMPLANT

## 2017-07-26 NOTE — Discharge Instructions (Addendum)
CYSTOSCOPY HOME CARE INSTRUCTIONS  Activity: Rest for the remainder of the day.  Do not drive or operate equipment today.  You may resume normal activities in one to two days as instructed by your physician.   Meals: Drink plenty of liquids and eat light foods such as gelatin or soup this evening.  You may return to a normal meal plan tomorrow.  Return to Work: You may return to work in one to two days or as instructed by your physician.  Special Instructions / Symptoms: Call your physician if any of these symptoms occur:   -persistent or heavy bleeding  -bleeding which continues after first few urination  -large blood clots that are difficult to pass  -urine stream diminishes or stops completely  -fever equal to or higher than 101 degrees Farenheit.  -cloudy urine with a strong, foul odor  -severe pain  Females should always wipe from front to back after elimination.  You may feel some burning pain when you urinate.  This should disappear with time.  Applying moist heat to the lower abdomen or a hot tub bath may help relieve the pain. \     Post Anesthesia Home Care Instructions  Activity: Get plenty of rest for the remainder of the day. A responsible individual must stay with you for 24 hours following the procedure.  For the next 24 hours, DO NOT: -Drive a car -Operate machinery -Drink alcoholic beverages -Take any medication unless instructed by your physician -Make any legal decisions or sign important papers.  Meals: Start with liquid foods such as gelatin or soup. Progress to regular foods as tolerated. Avoid greasy, spicy, heavy foods. If nausea and/or vomiting occur, drink only clear liquids until the nausea and/or vomiting subsides. Call your physician if vomiting continues.  Special Instructions/Symptoms: Your throat may feel dry or sore from the anesthesia or the breathing tube placed in your throat during surgery. If this causes discomfort, gargle with warm salt  water. The discomfort should disappear within 24 hours.  If you had a scopolamine patch placed behind your ear for the management of post- operative nausea and/or vomiting:  1. The medication in the patch is effective for 72 hours, after which it should be removed.  Wrap patch in a tissue and discard in the trash. Wash hands thoroughly with soap and water. 2. You may remove the patch earlier than 72 hours if you experience unpleasant side effects which may include dry mouth, dizziness or visual disturbances. 3. Avoid touching the patch. Wash your hands with soap and water after contact with the patch.     

## 2017-07-26 NOTE — Anesthesia Procedure Notes (Signed)
Procedure Name: LMA Insertion Date/Time: 07/26/2017 7:30 AM Performed by: Wanita Chamberlain Pre-anesthesia Checklist: Patient identified, Timeout performed, Emergency Drugs available, Suction available and Patient being monitored Patient Re-evaluated:Patient Re-evaluated prior to induction Oxygen Delivery Method: Circle system utilized Preoxygenation: Pre-oxygenation with 100% oxygen Induction Type: IV induction Ventilation: Mask ventilation without difficulty LMA: LMA inserted LMA Size: 4.0 Number of attempts: 1 Placement Confirmation: breath sounds checked- equal and bilateral and positive ETCO2 Tube secured with: Tape Dental Injury: Teeth and Oropharynx as per pre-operative assessment

## 2017-07-26 NOTE — Anesthesia Preprocedure Evaluation (Addendum)
Anesthesia Evaluation  Patient identified by MRN, date of birth, ID band Patient awake    Reviewed: Allergy & Precautions, NPO status , Patient's Chart, lab work & pertinent test results  Airway Mallampati: I  TM Distance: >3 FB Neck ROM: Full    Dental  (+) Teeth Intact, Dental Advisory Given, Caps,    Pulmonary former smoker,    Pulmonary exam normal        Cardiovascular Exercise Tolerance: Poor hypertension, Pt. on medications + CAD, + Past MI and + CABG  Normal cardiovascular exam     Neuro/Psych Depression    GI/Hepatic hiatal hernia, GERD  Medicated and Controlled,  Endo/Other  diabetes, Poorly Controlled, Type 2, Oral Hypoglycemic Agents  Renal/GU  Bladder dysfunction  Bladder tumor BPH    Musculoskeletal  (+) Arthritis , Osteoarthritis,    Abdominal   Peds  Hematology  (+) anemia ,   Anesthesia Other Findings   Reproductive/Obstetrics                      Anesthesia Physical Anesthesia Plan  ASA: III  Anesthesia Plan: General   Post-op Pain Management:    Induction:   PONV Risk Score and Plan: 2 and Ondansetron and Dexamethasone  Airway Management Planned: LMA  Additional Equipment:   Intra-op Plan:   Post-operative Plan: Extubation in OR  Informed Consent: I have reviewed the patients History and Physical, chart, labs and discussed the procedure including the risks, benefits and alternatives for the proposed anesthesia with the patient or authorized representative who has indicated his/her understanding and acceptance.     Plan Discussed with: CRNA and Surgeon  Anesthesia Plan Comments:         Anesthesia Quick Evaluation

## 2017-07-26 NOTE — Interval H&P Note (Signed)
History and Physical Interval Note:  07/26/2017 7:01 AM  Seth Simpson  has presented today for surgery, with the diagnosis of BLADDER TUMOR  The various methods of treatment have been discussed with the patient and family. After consideration of risks, benefits and other options for treatment, the patient has consented to  Procedure(s): CYSTOSCOPY WITH BIOPSY AND FULGURATION (N/A) CYSTOSCOPY WITH BILATERAL  RETROGRADE PYELOGRAM (Bilateral) as a surgical intervention .  The patient's history has been reviewed, patient examined, no change in status, stable for surgery.  I have reviewed the patient's chart and labs.  Questions were answered to the patient's satisfaction.     Shanina Kepple J

## 2017-07-26 NOTE — Anesthesia Postprocedure Evaluation (Signed)
Anesthesia Post Note  Patient: Seth Simpson  Procedure(s) Performed: Procedure(s) (LRB): urethral dilation CYSTOSCOPY WITH BIOPSY AND FULGURATION, removal of bladder stone (N/A) CYSTOSCOPY WITH BILATERAL  RETROGRADE PYELOGRAM (Bilateral)     Patient location during evaluation: PACU Anesthesia Type: General Level of consciousness: awake and alert Pain management: pain level controlled Vital Signs Assessment: post-procedure vital signs reviewed and stable Respiratory status: spontaneous breathing, nonlabored ventilation, respiratory function stable and patient connected to nasal cannula oxygen Cardiovascular status: blood pressure returned to baseline and stable Postop Assessment: no signs of nausea or vomiting Anesthetic complications: no    Last Vitals:  Vitals:   07/26/17 0845 07/26/17 0900  BP: 139/68 (!) 143/68  Pulse: (!) 57 (!) 55  Resp: 13 11  Temp:    SpO2: 93% 92%    Last Pain:  Vitals:   07/26/17 0845  TempSrc:   PainSc: 0-No pain                 Tal Kempker DAVID

## 2017-07-26 NOTE — Op Note (Signed)
NAMEALYAN, Seth Simpson                ACCOUNT NO.:  [id number]  MEDICAL RECORD NO.:  [id number]  LOCATION:                                 FACILITY:  PHYSICIAN:  Marshall Cork. Jeffie Pollock, M.D.         DATE OF BIRTH:  DATE OF PROCEDURE:  07/26/2017 DATE OF DISCHARGE:                              OPERATIVE REPORT   PROCEDURES: 1. Cystoscopy with urethral dilation. 2. Cystoscopy with removal of bladder stone, sample. 3. Cystoscopy with bilateral retrograde pyelograms with     interpretation. 4. Cystoscopy with biopsy and fulguration of a small bladder tumor.  PREOPERATIVE DIAGNOSIS:  Bladder tumor of left lateral wall bladder stone and meatal stenosis.  POSTOPERATIVE DIAGNOSIS:  Bladder tumors of right and left lateral wall bladder stone and meatal stenosis.  SURGEON:  Dr. Irine Seal.  ANESTHESIA:  General.  SPECIMEN:  Bladder stone, and right and left lateral wall bladder biopsies.  DRAINS:  None.  BLOOD LOSS:  Minimal.  COMPLICATIONS:  None.  INDICATIONS:  Seth Simpson is an 83 year old white male, who was found on recent evaluation to have a small tumor of the left lateral wall of the bladder and approximately 9 mm bladder stone.  Meatal stenosis was noted in the office, requiring dilation.  It was felt that cysto- retrogrades, bladder stone removal, bladder biopsy, and fulguration were indicated.  FINDINGS AND PROCEDURE:  He was taken to the operating room, where general anesthetic was induced.  He was given Ancef.  He was placed in lithotomy position, and was fitted with PAS hose.  His perineum and genitalia were prepped with Betadine solution and draped in usual sterile fashion.  His urethra was dilated from 16-28-French with Leander Rams sounds because of the meatal stenosis, and then underwent cystoscopy.  Examination revealed an otherwise normal urethra.  The external sphincter was intact.  The prostatic urethra was approximately 4 cm in length with trilobar hyperplasia  with obstruction.  Examination of bladder revealed severe trabeculation with multiple cellules and small diverticula. There was some edema of the mucosa of the middle lobe.  Inspection revealed approximately 9 mm stone in the deep bladder base.  There was about an 8 mm papillary lesion on the left lateral wall with an adjacent 3 mm lesion and on the right lateral wall adjacent to a small diverticulum with some mucosal irregularity worrisome for urothelial neoplasm.  The ureteral orifices were slightly laterally displaced.  The left was normal in appearance.  The right was slightly horseshoe shaped.  Once a thorough inspection was performed, the bladder stone was evacuated from the bladder without difficulty.  A 5-French open-end catheter was then passed and left retrograde pyelogram was performed with Omnipaque.  The left retrograde pyelogram demonstrated J-hooking of the distal ureter, but within delicate ureter and intrarenal collecting system without filling defects.  The right ureteral orifice was more difficult to access, requiring an Albarran bridge in 70 degree lens.  The retrograde pyelogram once again revealed fairly significant J hooking, but an otherwise delicate ureter and intrarenal collecting system without filling defects.  Once retrograde pyelography was performed, the rigid biopsy forceps was used to obtain biopsies from the  lesion on the right lateral wall and the left lateral wall of the bladder.  The biopsy sites were then fulgurated.  The fulguration site on the right was approximately 8 mm completion; and on the left, approximately 8 mm on completion.  Final inspection revealed no active bleeding or residual tumor tissue. The bladder was then drained.  The cystoscope was removed.  It was not felt.  Foley catheter was indicated.  He was taken down from lithotomy position.  The anesthetic was reversed.  He was moved to the recovery room in stable condition.   There were no complications.     Marshall Cork. Jeffie Pollock, M.D.   ______________________________ Marshall Cork. Jeffie Pollock, M.D.    JJW/MEDQ  D:  07/26/2017  T:  07/26/2017  Job:  [id number]

## 2017-07-26 NOTE — Transfer of Care (Signed)
Immediate Anesthesia Transfer of Care Note  Patient: Seth Simpson  Procedure(s) Performed: Procedure(s): urethral dilation CYSTOSCOPY WITH BIOPSY AND FULGURATION, removal of bladder stone (N/A) CYSTOSCOPY WITH BILATERAL  RETROGRADE PYELOGRAM (Bilateral)  Patient Location: PACU  Anesthesia Type:General  Level of Consciousness: awake, alert , oriented and patient cooperative  Airway & Oxygen Therapy: Patient Spontanous Breathing and Patient connected to nasal cannula oxygen  Post-op Assessment: Report given to RN and Post -op Vital signs reviewed and stable  Post vital signs: Reviewed and stable  Last Vitals:  Vitals:   07/26/17 0557  BP: 139/68  Pulse: (!) 58  Resp: 16  Temp: 36.7 C  SpO2: 99%    Last Pain:  Vitals:   07/26/17 0557  TempSrc: Oral      Patients Stated Pain Goal: 5 (15/17/61 6073)  Complications: No apparent anesthesia complications

## 2017-07-26 NOTE — Brief Op Note (Signed)
07/26/2017  8:01 AM  PATIENT:  Seth Simpson  83 y.o. male  PRE-OPERATIVE DIAGNOSIS:  BLADDER TUMOR, BLADDER STONE, MEATAL STENOSIS  POST-OPERATIVE DIAGNOSIS:  BLADDER TUMOR RIGHT AND LEFT LATERAL WALL, BLADDER STONE, MEATAL STENOSIS  PROCEDURE:  Procedure(s): CYSTOSCOPY WITH URETHRAL DILATION. CYSTOSCOPY WITH REMOVAL OF BLADDER STONE CYSTOSCOPY WITH BIOPSY AND FULGURATION, removal of bladder stone (N/A) CYSTOSCOPY WITH BILATERAL  RETROGRADE PYELOGRAM WITH INTERPRETATION (Bilateral)  SURGEON:  Surgeon(s) and Role:    Irine Seal, MD - Primary  PHYSICIAN ASSISTANT:   ASSISTANTS: none   ANESTHESIA:   general  EBL:  No intake/output data recorded.  BLOOD ADMINISTERED:none  DRAINS: none   LOCAL MEDICATIONS USED:  NONE  SPECIMEN:  Source of Specimen:  BLADDER STONE AND RIGHT AND LEFT LATERAL WALL BIOPSIES  DISPOSITION OF SPECIMEN:  STONE TO FAMILY, BIOPSIES TO PATHOLOGY  COUNTS:  YES  TOURNIQUET:  * No tourniquets in log *  DICTATION: .Other Dictation: Dictation Number [id number]  PLAN OF CARE: Discharge to home after PACU  PATIENT DISPOSITION:  PACU - hemodynamically stable.   Delay start of Pharmacological VTE agent (>24hrs) due to surgical blood loss or risk of bleeding: not applicable

## 2017-07-27 ENCOUNTER — Encounter (HOSPITAL_BASED_OUTPATIENT_CLINIC_OR_DEPARTMENT_OTHER): Payer: Self-pay | Admitting: Urology

## 2017-09-28 ENCOUNTER — Other Ambulatory Visit: Payer: Self-pay | Admitting: Cardiology

## 2017-10-08 ENCOUNTER — Encounter (HOSPITAL_BASED_OUTPATIENT_CLINIC_OR_DEPARTMENT_OTHER): Payer: Self-pay | Admitting: Emergency Medicine

## 2017-10-08 DIAGNOSIS — Z951 Presence of aortocoronary bypass graft: Secondary | ICD-10-CM | POA: Insufficient documentation

## 2017-10-08 DIAGNOSIS — Y9301 Activity, walking, marching and hiking: Secondary | ICD-10-CM | POA: Insufficient documentation

## 2017-10-08 DIAGNOSIS — Y92003 Bedroom of unspecified non-institutional (private) residence as the place of occurrence of the external cause: Secondary | ICD-10-CM | POA: Diagnosis not present

## 2017-10-08 DIAGNOSIS — Z7984 Long term (current) use of oral hypoglycemic drugs: Secondary | ICD-10-CM | POA: Insufficient documentation

## 2017-10-08 DIAGNOSIS — Z79899 Other long term (current) drug therapy: Secondary | ICD-10-CM | POA: Insufficient documentation

## 2017-10-08 DIAGNOSIS — Y999 Unspecified external cause status: Secondary | ICD-10-CM | POA: Diagnosis not present

## 2017-10-08 DIAGNOSIS — Z7982 Long term (current) use of aspirin: Secondary | ICD-10-CM | POA: Insufficient documentation

## 2017-10-08 DIAGNOSIS — Z87891 Personal history of nicotine dependence: Secondary | ICD-10-CM | POA: Diagnosis not present

## 2017-10-08 DIAGNOSIS — S0101XA Laceration without foreign body of scalp, initial encounter: Secondary | ICD-10-CM | POA: Insufficient documentation

## 2017-10-08 DIAGNOSIS — Z96653 Presence of artificial knee joint, bilateral: Secondary | ICD-10-CM | POA: Diagnosis not present

## 2017-10-08 DIAGNOSIS — E119 Type 2 diabetes mellitus without complications: Secondary | ICD-10-CM | POA: Diagnosis not present

## 2017-10-08 DIAGNOSIS — W0110XA Fall on same level from slipping, tripping and stumbling with subsequent striking against unspecified object, initial encounter: Secondary | ICD-10-CM | POA: Diagnosis not present

## 2017-10-08 DIAGNOSIS — Z91010 Allergy to peanuts: Secondary | ICD-10-CM | POA: Diagnosis not present

## 2017-10-08 DIAGNOSIS — I10 Essential (primary) hypertension: Secondary | ICD-10-CM | POA: Diagnosis not present

## 2017-10-08 DIAGNOSIS — I2510 Atherosclerotic heart disease of native coronary artery without angina pectoris: Secondary | ICD-10-CM | POA: Insufficient documentation

## 2017-10-08 DIAGNOSIS — S0990XA Unspecified injury of head, initial encounter: Secondary | ICD-10-CM | POA: Diagnosis present

## 2017-10-08 NOTE — ED Triage Notes (Signed)
Pt presents with laceration to head. PT sts he lost his balance in the dark while going to bed tonight. 3 lacs to top of head bleeding controlled.

## 2017-10-09 ENCOUNTER — Encounter (HOSPITAL_BASED_OUTPATIENT_CLINIC_OR_DEPARTMENT_OTHER): Payer: Self-pay | Admitting: Emergency Medicine

## 2017-10-09 ENCOUNTER — Emergency Department (HOSPITAL_BASED_OUTPATIENT_CLINIC_OR_DEPARTMENT_OTHER)
Admission: EM | Admit: 2017-10-09 | Discharge: 2017-10-09 | Disposition: A | Discharge status: Home / Self Care | Payer: Medicare PPO | Attending: Physician Assistant | Admitting: Physician Assistant

## 2017-10-09 ENCOUNTER — Emergency Department (HOSPITAL_BASED_OUTPATIENT_CLINIC_OR_DEPARTMENT_OTHER): Payer: Medicare PPO

## 2017-10-09 DIAGNOSIS — S0101XA Laceration without foreign body of scalp, initial encounter: Secondary | ICD-10-CM

## 2017-10-09 MED ORDER — TETANUS-DIPHTH-ACELL PERTUSSIS 5-2.5-18.5 LF-MCG/0.5 IM SUSP: 0.5000 mL | Freq: Once | INTRAMUSCULAR | Status: DC

## 2017-10-09 MED ORDER — ACETAMINOPHEN 325 MG PO TABS
650.0000 mg | ORAL_TABLET | Freq: Once | ORAL | Status: AC
  Administered 2017-10-09: 650 mg via ORAL
  Filled 2017-10-09: qty 2

## 2017-10-09 NOTE — Discharge Instructions (Signed)
Please do not apply water to the scalp for 24 hours.  Then you may wash as normal.  Return with any concerns.

## 2017-10-09 NOTE — ED Notes (Signed)
Alert, NAD, calm, interactive, resps e/u, speaking in clear complete sentences, no dyspnea noted, skin W&D, VSS, tripped, fell in bedroom and hit wooden dresser with metal knobs, skin tears noted to parietal head,c/o head pain (denies: LOC, NV, sob, nausea, dizziness, neck or back pain, malocclusion, loose teeth, hearing or visual changes). Family at [facility].

## 2017-10-09 NOTE — ED Notes (Signed)
No changes, back from CT.

## 2017-10-09 NOTE — ED Notes (Signed)
No changes. To CT.

## 2017-10-09 NOTE — ED Provider Notes (Signed)
[facility]ERGENCY DEPARTMENT Provider Note   CSN: [id number] Arrival date & time: 10/08/17  2348     History   Chief Complaint Chief Complaint  Patient presents with  . Head Laceration    HPI Seth Simpson is a 83 y.o. male.  HPI   Patient is an 83 year old male presenting after mechanical fall.  Patient turn off the lights to his bedroom and then walked to his bed and got disoriented and fell and struck his head.  Patient got no neurologic complaints.  No trauma except to the anterior of his head.  Patient was brought in by son.  Past Medical History:  Diagnosis Date  . Bicuspid aortic valve   . Bladder tumor   . BPH (benign prostatic hypertrophy)   . Coronary artery disease cardiologist-  dr Marlou Porch   a. s/p CABG  x5  03/2008 - presentation acute STEMI  . Depression   . Erectile dysfunction   . GERD (gastroesophageal reflux disease)   . Gout    per pt 07-14-2017 stable  . Heart murmur   . Hiatal hernia   . History of bladder stone   . History of kidney stones   . History of prostatitis    and hx orchitis  . History of ST elevation myocardial infarction (STEMI)    04-10-2008  acute inferior  . History of syncope    2011 sinoatrial node dysfunction  and 11-20-2014 presyncope w/ wide complex tachycardia resolved  w/ valvasa and adenosine  . Hyperlipidemia   . Hypertension   . Iron deficiency anemia   . LAFB (left anterior fascicular block)   . Lower urinary tract symptoms (LUTS)   . Mild aortic stenosis    echo 04-23-2014  Valve Area 1.69 cm^2;  last echo 12-09-2016 valve area 1.47cm^2  . OA (osteoarthritis)   . PSVT (paroxysmal supraventricular tachycardia) ([place])   . PVC's (premature ventricular contractions)   . Rheumatoid arthritis ([place]) 10/02/2013   Dr. Lenna Gilford, methotrexate  . S/P CABG x 5    04-11-2008  . Sigmoid diverticulosis   . Type II diabetes mellitus ([place])   . Wears glasses     Patient Active Problem List   Diagnosis Date Noted    . Supraventricular tachycardia ([place])   . Myocardial infarction ([place])   . Type II diabetes mellitus ([place])   . Sinus headache   . PVC's (premature ventricular contractions)   . Left anterior fascicular block   . PSVT (paroxysmal supraventricular tachycardia) ([place]) 04/22/2015  . High risk medication use 04/22/2015  . Aortic stenosis 04/22/2015  . LAFB (left anterior fascicular block) 04/22/2015  . SVT (supraventricular tachycardia) ([place]) 11/20/2014  . PVC (premature ventricular contraction) 10/22/2014  . Ventricular bigeminy 10/22/2014  . Mild aortic stenosis 10/22/2014  . Rheumatoid arthritis ([place]) 10/02/2013  . Coronary artery disease   . Old MI (myocardial infarction)   . Hypertension   . Heart murmur   . Anemia   . GERD (gastroesophageal reflux disease)   . Arthritis   . ASVD (arteriosclerotic vascular disease)   . Hypercholesteremia   . Decreased libido   . Orchitis and epididymitis   . Prostatitis   . BPH (benign prostatic hyperplasia)   . Bradycardia   . Syncope   . Bicuspid aortic valve     Past Surgical History:  Procedure Laterality Date  . APPENDECTOMY  1954  . BALLOON DILATION  04/06/2012   Procedure: BALLOON DILATION;  Surgeon: Garlan Fair, MD;  Location:  [facility];  Service: Endoscopy;  Laterality: N/A;  . CARDIAC CATHETERIZATION  04-10-2008  dr Daneen Schick   3 vessel CAD  . CATARACT EXTRACTION W/ INTRAOCULAR LENS  IMPLANT, BILATERAL Bilateral 2003  . CHOLECYSTECTOMY OPEN  1978  . COLONOSCOPY  04/06/2012   Procedure: COLONOSCOPY;  Surgeon: Garlan Fair, MD;  Location: [facility];  Service: Endoscopy;  Laterality: N/A;  . CORONARY ARTERY BYPASS GRAFT  04-11-2008   LIMA to RI,  SVG to D1,  SVG to LAD,  sequetial SVG to Acute Marginal and pRCA  . CYSTOSCOPY W/ RETROGRADES Bilateral 07/26/2017   Procedure: CYSTOSCOPY WITH BILATERAL  RETROGRADE PYELOGRAM;  Surgeon: Irine Seal, MD;  Location: [facility];  Service: Urology;  Laterality:  Bilateral;  . CYSTOSCOPY WITH BIOPSY N/A 06/17/2016   Procedure: CYSTOSCOPY WITH BIOPSY;  Surgeon: Irine Seal, MD;  Location: [facility];  Service: Urology;  Laterality: N/A;  . CYSTOSCOPY WITH BIOPSY N/A 07/26/2017   Procedure: urethral dilation CYSTOSCOPY WITH BIOPSY AND FULGURATION, removal of bladder stone;  Surgeon: Irine Seal, MD;  Location: [facility];  Service: Urology;  Laterality: N/A;  . CYSTOSCOPY WITH LITHOLAPAXY N/A 06/17/2016   Procedure: CYSTOSCOPY WITH LITHOLAPAXY;  Surgeon: Irine Seal, MD;  Location: [facility];  Service: Urology;  Laterality: N/A;  . CYSTOSCOPY WITH RETROGRADE PYELOGRAM, URETEROSCOPY AND STENT PLACEMENT  06-17-2016  dr Irine Seal   Cysto/  Bilateral RTG's/  Bladder bx and fulgeration bladder tumor's left & right lateral wall's/  Litholapaxy bladder stone/  left ureteroscopy/  Insertion bilateral double J stents  . CYSTOSCOPY WITH RETROGRADE PYELOGRAM, URETEROSCOPY AND STENT PLACEMENT Bilateral 06/17/2016   Procedure: CYSTOSCOPY BILATERAL RETROGRADE,  LEFT URETEROSCOPY AND BILATERAL STENT PLACEMENT;  Surgeon: Irine Seal, MD;  Location: [facility];  Service: Urology;  Laterality: Bilateral;  . CYSTOSCOPY WITH URETEROSCOPY AND STENT PLACEMENT Bilateral 07/01/2016   Procedure: BILATERAL URETEROSCOPY STONE EXTRACTIONS WITH POSSIBLE STENT PLACEMENT;  Surgeon: Irine Seal, MD;  Location: [facility];  Service: Urology;  Laterality: Bilateral;  . ESOPHAGOGASTRODUODENOSCOPY  04/06/2012   Procedure: ESOPHAGOGASTRODUODENOSCOPY (EGD);  Surgeon: Garlan Fair, MD;  Location: [facility];  Service: Endoscopy;  Laterality: N/A;  . FINGER SURGERY Left 2000's   "removed BB", 2nd digit  . HOLMIUM LASER APPLICATION N/A 04/15/85   Procedure: HOLMIUM LASER APPLICATION;  Surgeon: Irine Seal, MD;  Location: [facility];  Service: Urology;  Laterality: N/A;  . HOLMIUM LASER APPLICATION Bilateral  7/61/9509   Procedure: HOLMIUM LASER APPLICATION;  Surgeon: Irine Seal, MD;  Location: [facility];  Service: Urology;  Laterality: Bilateral;  . TOTAL KNEE ARTHROPLASTY Bilateral right 03-20-2008//  left 08-23-2007  . TRANSTHORACIC ECHOCARDIOGRAM  12-09-2016 dr Marlou Porch   mild LVH,  ef 50-55%/  Bicuspid AV with stenosis, valve area 1.47 cm^2 (previous echo it was 1.67cm^2 on 04-23-2014), trivial regurg/  mild LAE/ mild MR/  moderate TR, PASP 75mmHg/        Home Medications    Prior to Admission medications   Medication Sig Start Date End Date Taking? Authorizing Provider  ACCU-CHEK AVIVA PLUS test strip  11/10/15   [provider]  ACCU-CHEK SOFTCLIX LANCETS lancets USE TO TEST BLOOD SUGAR DAILY 08/15/15   [provider]  acetaminophen (TYLENOL) 500 MG tablet Take 500 mg by mouth every 6 (six) hours as needed for mild pain, moderate pain, fever or headache.    [provider]  allopurinol (ZYLOPRIM) 300 MG  tablet Take 300 mg by mouth every evening.     [provider]  amiodarone (PACERONE) 200 MG tablet TAKE 1 TABLET EVERY DAY 09/28/17   Jerline Pain, MD  amLODipine-benazepril (LOTREL) 5-20 MG per capsule Take 1 capsule by mouth every evening.     [provider]  aspirin 81 MG tablet Take 81 mg by mouth every morning.     [provider]  CHERRY CONCENTRATE PO Take 1 tablet by mouth daily. OTC-Tart Cherry Extract tablet    [provider]  Cholecalciferol (VITAMIN D3) 2000 units TABS Take 1 tablet by mouth every evening.    [provider]  colchicine 0.6 MG tablet Take 0.6 mg by mouth daily as needed (gout).     [provider]  diphenhydrAMINE (BENADRYL) 25 MG tablet Take 25 mg by mouth daily as needed for allergies. Reported on 06/19/2016    [provider]  ferrous sulfate 325 (65 FE) MG tablet Take 650 mg by mouth every evening.     [provider]  finasteride (PROSCAR) 5  MG tablet Take 5 mg by mouth every evening.  03/24/16   [provider]  Flaxseed, Linseed, (FLAXSEED OIL MAX STR) 1300 MG CAPS Take 1 capsule by mouth every evening.     [provider]  folic acid (FOLVITE) 1 MG tablet Take 2 mg by mouth every morning.     [provider]  loratadine (CLARITIN) 10 MG tablet Take 10 mg by mouth every morning.     [provider]  metFORMIN (GLUCOPHAGE) 500 MG tablet Take 500 mg by mouth 2 (two) times daily with a meal.  11/04/14   [provider]  methotrexate (RHEUMATREX) 2.5 MG tablet Take 15 mg by mouth every Friday. Take on Fridays.  Caution:Chemotherapy. Protect from light.    [provider]  metoprolol succinate (TOPROL-XL) 25 MG 24 hr tablet Take 1 tablet (25 mg total) by mouth daily. Patient taking differently: Take 25 mg by mouth every morning.  05/10/14   Jerline Pain, MD  Multiple Vitamin (MULTIVITAMIN WITH MINERALS) TABS tablet Take 1 tablet by mouth every evening.     [provider]  naproxen (NAPRELAN) 500 MG 24 hr tablet Take 500 mg by mouth daily as needed (Pain).     [provider]  omeprazole (PRILOSEC OTC) 20 MG tablet Take 10 mg by mouth as needed.     [provider]  rosuvastatin (CRESTOR) 10 MG tablet Take 10 mg by mouth every Monday, Wednesday, and Friday.     [provider]  sertraline (ZOLOFT) 100 MG tablet Take 50 mg by mouth every morning.  10/05/14   [provider]  sildenafil (VIAGRA) 100 MG tablet Take 100 mg by mouth daily as needed for erectile dysfunction.    [provider]  terazosin (HYTRIN) 5 MG capsule Take 5 mg by mouth at bedtime.     [provider]  traMADol (ULTRAM) 50 MG tablet Take 1 tablet (50 mg total) by mouth every 6 (six) hours as needed. 07/26/17 07/26/18  Irine Seal, MD  triamcinolone (NASACORT ALLERGY 24HR) 55 MCG/ACT AERO nasal inhaler Place 2 sprays into the nose daily as needed (Nasal  congestion).    [provider]    Family History Family History  Problem Relation Age of Onset  . Heart disease Father     Social History Social History  Substance Use Topics  . Smoking status: Former Smoker  Packs/day: 1.00    Years: 15.00    Types: Cigarettes    Quit date: 09/29/1964  . Smokeless tobacco: Never Used  . Alcohol use 1.2 oz/week    2 Glasses of wine per week     Allergies   Peanut-containing drug products; Hydrochlorothiazide; Other; Pistachio nut (diagnostic); Codeine; and Fruit & vegetable daily [nutritional supplements]   Review of Systems Review of Systems  Constitutional: Negative for activity change.  Respiratory: Negative for shortness of breath.   Cardiovascular: Negative for chest pain.  Gastrointestinal: Negative for abdominal pain.     Physical Exam Updated Vital Signs BP (!) 165/86 (BP Location: Left Arm)   Pulse 80   Temp 98.7 F (37.1 C) (Oral)   Resp 18   SpO2 97%   Physical Exam  Constitutional: He is oriented to person, place, and time. He appears well-nourished.  HENT:  Head: Normocephalic.  2 V-shaped lacerations to the anterior head.  They are relatively superficial in nature.  Eyes: Conjunctivae are normal. Right eye exhibits no discharge. Left eye exhibits no discharge.  Cardiovascular: Normal rate and regular rhythm.   Pulmonary/Chest: Effort normal and breath sounds normal. No respiratory distress.  Abdominal: Soft. He exhibits no distension.  Neurological: He is oriented to person, place, and time.  Skin: Skin is warm and dry. He is not diaphoretic.  Psychiatric: He has a normal mood and affect. His behavior is normal.     ED Treatments / Results  Labs (all labs ordered are listed, but only abnormal results are displayed) Labs Reviewed - No data to display  EKG  EKG Interpretation None       Radiology No results found.  Procedures Procedures (including critical care time)  LACERATION  REPAIR Performed by: Gardiner Sleeper Authorized by: Gardiner Sleeper Consent: Verbal consent obtained. Risks and benefits: risks, benefits and alternatives were discussed Consent given by: patient Patient identity confirmed: provided demographic data Prepped and Draped in normal sterile fashion Wound explored  Laceration Location: 2 4 cm v shapped laceration on scaplp.   No Foreign Bodies seen or palpated  Irrigation method: syringe Amount of cleaning: standard  Skin closure: with dermabond andsteri strips   Patient tolerance: Patient tolerated the procedure well with no immediate complications.   Medications Ordered in ED Medications  Tdap (BOOSTRIX) injection 0.5 mL (not administered)  acetaminophen (TYLENOL) tablet 650 mg (650 mg Oral Given 10/09/17 0156)     Initial Impression / Assessment and Plan / ED Course  I have reviewed the triage vital signs and the nursing notes.  Pertinent labs & imaging results that were available during my care of the patient were reviewed by me and considered in my medical decision making (see chart for details).     Patient is an 83 year old male presenting after mechanical fall.  Patient turn off the lights to his bedroom and then walked to his bed and got disoriented and fell and struck his head.  Patient got no neurologic complaints.  No trauma except to the anterior of his head.  Patient was brought in by son.  Given how thin patient's skin is.  Decision made to use Dermabond with Steri-Strips to the repair lacerations.  Patient alert and oriented x3 and a very well-appearing on discharge.  Final Clinical Impressions(s) / ED Diagnoses   Final diagnoses:  None    New Prescriptions New Prescriptions   No medications on file     Macarthur Critchley, MD 10/09/17 [phone]

## 2017-11-18 ENCOUNTER — Other Ambulatory Visit: Payer: Self-pay | Admitting: Cardiology

## 2017-11-22 ENCOUNTER — Ambulatory Visit: Payer: Medicare PPO | Admitting: Cardiology

## 2017-11-24 ENCOUNTER — Encounter: Payer: Self-pay | Admitting: Cardiology

## 2017-11-24 ENCOUNTER — Ambulatory Visit: Payer: Medicare PPO | Admitting: Cardiology

## 2017-11-24 VITALS — BP 130/70 | HR 60 | Ht 69.0 in | Wt 146.8 lb

## 2017-11-24 DIAGNOSIS — I252 Old myocardial infarction: Secondary | ICD-10-CM

## 2017-11-24 DIAGNOSIS — I471 Supraventricular tachycardia: Secondary | ICD-10-CM

## 2017-11-24 DIAGNOSIS — I2510 Atherosclerotic heart disease of native coronary artery without angina pectoris: Secondary | ICD-10-CM

## 2017-11-24 DIAGNOSIS — I350 Nonrheumatic aortic (valve) stenosis: Secondary | ICD-10-CM | POA: Diagnosis not present

## 2017-11-24 DIAGNOSIS — E7800 Pure hypercholesterolemia, unspecified: Secondary | ICD-10-CM

## 2017-11-24 NOTE — Progress Notes (Signed)
[facility]. [address] [place] [place], [place]  [address] Phone: [phone] Fax:  [phone]  Date:  11/24/2017   ID:  Seth Simpson, DOB [birth year], MRN [id number]  PCP:  Josetta Huddle, MD   History of Present Illness: Seth Simpson is a 83 y.o. male with coronary artery disease status post bypass surgery in April of 2009, prior myocardial infarction/inferior, history of anemia, left anterior fascicular block, PVCs, minimal aortic stenosis with possible bicuspid valve with hyperlipidemia here for followup.  Admitted on 11/20/14 with incessant SVT. Wide-complex tachycardia at his primary physician's all this. Terminated rapid rhythm with carotid sinus massage as well as adenosine as well as diltiazem. Tachycardia then returned. He was placed on IV amiodarone and considered catheter ablation. Dr. Caryl Comes and Dr. Rayann Heman both saw him. Preferred medical approach at this time. Oral amiodarone. Stable. Saw Richardson Dopp on 12/18/14. He has had pulmonary function studies with primary physician.  Dr. Inda Merlin reduced his Crestor frequency.  Doing well with three times weekly Crestor.  Denies any syncope, shortness of breath, chest pain, palpitations, PND, orthopnea.  He occasionally gets lightheaded if he stands too quickly. Stable.  He is less active outside of the home because he is taking care of his wife with advancing dementia.   Bilateral ureteroscopy by Dr. Shirlyn Goltz of urology on 07/01/16.  11/24/17-overall no change over the year.  His wife memory has progressed.  She no longer calls out for her father.  They no longer have a land line at home because she called the police twice to go search for her daughter.  Her daughter died over 10 years ago with leukemia.  He denies any chest pain, syncope, bleeding, orthopnea, PND.  He tries to be positive.  They have signed her up to have rest home.  Studies:  - LHC (4/09): LAD 50-70%, ostial/proximal D1 70-75%, proximal RI 95%, CFX subtotally  occluded, RCA 95% >> CABG - Echo (5/15): Mild LVH, EF 60-65%, normal wall motion, mild AS (mean 12 mmHg) , mild MR   Recent Labs: 11/20/2014: ALT 13; TSH 2.740 11/21/2014: BUN 19; Creatinine 1.04; Hemoglobin 11.8*; LDL (calc) 44; Potassium 4.4; Sodium 141    Wt Readings from Last 3 Encounters:  11/24/17 146 lb 12.8 oz (66.6 kg)  07/26/17 139 lb (63 kg)  11/18/16 141 lb 9.6 oz (64.2 kg)     Past Medical History:  Diagnosis Date  . Bicuspid aortic valve   . Bladder tumor   . BPH (benign prostatic hypertrophy)   . Coronary artery disease cardiologist-  dr Marlou Porch   a. s/p CABG  x5  03/2008 - presentation acute STEMI  . Depression   . Erectile dysfunction   . GERD (gastroesophageal reflux disease)   . Gout    per pt 07-14-2017 stable  . Heart murmur   . Hiatal hernia   . History of bladder stone   . History of kidney stones   . History of prostatitis    and hx orchitis  . History of ST elevation myocardial infarction (STEMI)    04-10-2008  acute inferior  . History of syncope    2011 sinoatrial node dysfunction  and 11-20-2014 presyncope w/ wide complex tachycardia resolved  w/ valvasa and adenosine  . Hyperlipidemia   . Hypertension   . Iron deficiency anemia   . LAFB (left anterior fascicular block)   . Lower urinary tract symptoms (LUTS)   . Mild aortic stenosis  echo 04-23-2014  Valve Area 1.69 cm^2;  last echo 12-09-2016 valve area 1.47cm^2  . OA (osteoarthritis)   . PSVT (paroxysmal supraventricular tachycardia) ([place])   . PVC's (premature ventricular contractions)   . Rheumatoid arthritis ([place]) 10/02/2013   Dr. Lenna Gilford, methotrexate  . S/P CABG x 5    04-11-2008  . Sigmoid diverticulosis   . Type II diabetes mellitus ([place])   . Wears glasses     Past Surgical History:  Procedure Laterality Date  . APPENDECTOMY  1954  . BALLOON DILATION  04/06/2012   Procedure: BALLOON DILATION;  Surgeon: Garlan Fair, MD;  Location: [facility];  Service: Endoscopy;   Laterality: N/A;  . CARDIAC CATHETERIZATION  04-10-2008  dr Daneen Schick   3 vessel CAD  . CATARACT EXTRACTION W/ INTRAOCULAR LENS  IMPLANT, BILATERAL Bilateral 2003  . CHOLECYSTECTOMY OPEN  1978  . COLONOSCOPY  04/06/2012   Procedure: COLONOSCOPY;  Surgeon: Garlan Fair, MD;  Location: [facility];  Service: Endoscopy;  Laterality: N/A;  . CORONARY ARTERY BYPASS GRAFT  04-11-2008   LIMA to RI,  SVG to D1,  SVG to LAD,  sequetial SVG to Acute Marginal and pRCA  . CYSTOSCOPY W/ RETROGRADES Bilateral 07/26/2017   Procedure: CYSTOSCOPY WITH BILATERAL  RETROGRADE PYELOGRAM;  Surgeon: Irine Seal, MD;  Location: [facility];  Service: Urology;  Laterality: Bilateral;  . CYSTOSCOPY WITH BIOPSY N/A 06/17/2016   Procedure: CYSTOSCOPY WITH BIOPSY;  Surgeon: Irine Seal, MD;  Location: [facility];  Service: Urology;  Laterality: N/A;  . CYSTOSCOPY WITH BIOPSY N/A 07/26/2017   Procedure: urethral dilation CYSTOSCOPY WITH BIOPSY AND FULGURATION, removal of bladder stone;  Surgeon: Irine Seal, MD;  Location: [facility];  Service: Urology;  Laterality: N/A;  . CYSTOSCOPY WITH LITHOLAPAXY N/A 06/17/2016   Procedure: CYSTOSCOPY WITH LITHOLAPAXY;  Surgeon: Irine Seal, MD;  Location: [facility];  Service: Urology;  Laterality: N/A;  . CYSTOSCOPY WITH RETROGRADE PYELOGRAM, URETEROSCOPY AND STENT PLACEMENT  06-17-2016  dr Irine Seal   Cysto/  Bilateral RTG's/  Bladder bx and fulgeration bladder tumor's left & right lateral wall's/  Litholapaxy bladder stone/  left ureteroscopy/  Insertion bilateral double J stents  . CYSTOSCOPY WITH RETROGRADE PYELOGRAM, URETEROSCOPY AND STENT PLACEMENT Bilateral 06/17/2016   Procedure: CYSTOSCOPY BILATERAL RETROGRADE,  LEFT URETEROSCOPY AND BILATERAL STENT PLACEMENT;  Surgeon: Irine Seal, MD;  Location: [facility];  Service: Urology;  Laterality: Bilateral;  . CYSTOSCOPY WITH URETEROSCOPY AND STENT PLACEMENT  Bilateral 07/01/2016   Procedure: BILATERAL URETEROSCOPY STONE EXTRACTIONS WITH POSSIBLE STENT PLACEMENT;  Surgeon: Irine Seal, MD;  Location: [facility];  Service: Urology;  Laterality: Bilateral;  . ESOPHAGOGASTRODUODENOSCOPY  04/06/2012   Procedure: ESOPHAGOGASTRODUODENOSCOPY (EGD);  Surgeon: Garlan Fair, MD;  Location: [facility];  Service: Endoscopy;  Laterality: N/A;  . FINGER SURGERY Left 2000's   "removed BB", 2nd digit  . HOLMIUM LASER APPLICATION N/A 12/18/1094   Procedure: HOLMIUM LASER APPLICATION;  Surgeon: Irine Seal, MD;  Location: [facility];  Service: Urology;  Laterality: N/A;  . HOLMIUM LASER APPLICATION Bilateral 0/45/4098   Procedure: HOLMIUM LASER APPLICATION;  Surgeon: Irine Seal, MD;  Location: [facility];  Service: Urology;  Laterality: Bilateral;  . TOTAL KNEE ARTHROPLASTY Bilateral right 03-20-2008//  left 08-23-2007  . TRANSTHORACIC ECHOCARDIOGRAM  12-09-2016 dr Marlou Porch   mild LVH,  ef 50-55%/  Bicuspid AV with stenosis, valve area 1.47 cm^2 (previous echo it was 1.67cm^2  on 04-23-2014), trivial regurg/  mild LAE/ mild MR/  moderate TR, PASP 61mmHg/     Current Outpatient Medications  Medication Sig Dispense Refill  . ACCU-CHEK AVIVA PLUS test strip     . ACCU-CHEK SOFTCLIX LANCETS lancets USE TO TEST BLOOD SUGAR DAILY  12  . acetaminophen (TYLENOL) 500 MG tablet Take 500 mg by mouth every 6 (six) hours as needed for mild pain, moderate pain, fever or headache.    . allopurinol (ZYLOPRIM) 300 MG tablet Take 300 mg by mouth every evening.     Marland Kitchen amiodarone (PACERONE) 200 MG tablet TAKE 1 TABLET EVERY DAY 90 tablet 0  . amLODipine-benazepril (LOTREL) 5-20 MG per capsule Take 1 capsule by mouth every evening.     Marland Kitchen aspirin 81 MG tablet Take 81 mg by mouth every morning.     Marland Kitchen CHERRY CONCENTRATE PO Take 1 tablet by mouth daily. OTC-Tart Cherry Extract tablet    . Cholecalciferol (VITAMIN D3) 2000 units TABS Take 1  tablet by mouth every evening.    . colchicine 0.6 MG tablet Take 0.6 mg by mouth daily as needed (gout).     Marland Kitchen diphenhydrAMINE (BENADRYL) 25 MG tablet Take 25 mg by mouth daily as needed for allergies. Reported on 06/19/2016    . ferrous sulfate 325 (65 FE) MG tablet Take 650 mg by mouth every evening.     . finasteride (PROSCAR) 5 MG tablet Take 5 mg by mouth every evening.     . Flaxseed, Linseed, (FLAXSEED OIL MAX STR) 1300 MG CAPS Take 1 capsule by mouth every evening.     . folic acid (FOLVITE) 1 MG tablet Take 2 mg by mouth every morning.     . loratadine (CLARITIN) 10 MG tablet Take 10 mg by mouth every morning.     . metFORMIN (GLUCOPHAGE) 500 MG tablet Take 500 mg by mouth 2 (two) times daily with a meal.     . methotrexate (RHEUMATREX) 2.5 MG tablet Take 15 mg by mouth every Friday. Take on Fridays.  Caution:Chemotherapy. Protect from light.    . metoprolol succinate (TOPROL-XL) 25 MG 24 hr tablet Take 1 tablet (25 mg total) by mouth daily. (Patient taking differently: Take 25 mg by mouth every morning. ) 30 tablet 6  . Multiple Vitamin (MULTIVITAMIN WITH MINERALS) TABS tablet Take 1 tablet by mouth every evening.     . naproxen (NAPRELAN) 500 MG 24 hr tablet Take 500 mg by mouth daily as needed (Pain).     Marland Kitchen omeprazole (PRILOSEC OTC) 20 MG tablet Take 10 mg by mouth as needed.     . rosuvastatin (CRESTOR) 10 MG tablet Take 10 mg by mouth every Monday, Wednesday, and Friday.     . sertraline (ZOLOFT) 100 MG tablet Take 50 mg by mouth every morning.     . sildenafil (VIAGRA) 100 MG tablet Take 100 mg by mouth daily as needed for erectile dysfunction.    . tamsulosin (FLOMAX) 0.4 MG CAPS capsule Take 0.4 mg by mouth daily.  6  . terazosin (HYTRIN) 5 MG capsule Take 5 mg by mouth at bedtime.     . traMADol (ULTRAM) 50 MG tablet Take 1 tablet (50 mg total) by mouth every 6 (six) hours as needed. 4 tablet 0  . triamcinolone (NASACORT ALLERGY 24HR) 55 MCG/ACT AERO nasal inhaler Place 2 sprays  into the nose daily as needed (Nasal congestion).     No current facility-administered medications for this visit.  Allergies:    Allergies  Allergen Reactions  . Peanut-Containing Drug Products Anaphylaxis, Shortness Of Breath and Swelling    Peanuts... Anaphylaxis  . Hydrochlorothiazide Other (See Comments)    Avoids , flares up Gout  . Other Diarrhea    Splenda  . Pistachio Nut (Diagnostic) Swelling and Other (See Comments)    gout  . Codeine Itching and Rash  . Fruit & Vegetable Daily [Nutritional Supplements] Rash    Papaya    Social History:  The patient  reports that he quit smoking about 53 years ago. His smoking use included cigarettes. He has a 15.00 pack-year smoking history. he has never used smokeless tobacco. He reports that he drinks about 1.2 oz of alcohol per week. He reports that he does not use drugs.    Family History: The patient's family history includes Heart disease in his father.    ROS:  Please see the history of present illness.  All other review of systems negative  PHYSICAL EXAM: VS:  BP 130/70   Pulse 60   Ht 5\' 9"  (1.753 m)   Wt 146 lb 12.8 oz (66.6 kg)   SpO2 99%   BMI 21.68 kg/m  GEN: Well nourished, well developed, in no acute distress  HEENT: normal, walks with a cane Neck: no JVD, radiation of aortic murmur to carotids, no masses Cardiac: RRR; 2/6 systolic murmur, rubs, or gallops,no edema  Respiratory:  clear to auscultation bilaterally, normal work of breathing GI: soft, nontender, nondistended, + BS MS: no deformity or atrophy  Skin: warm and dry, no rash Neuro:  Alert and Oriented x 3, Strength and sensation are intact Psych: euthymic mood, full affect   EKG:  10/22/14-sinus bradycardia rate 58 with frequent PVCs in bigeminy pattern, left axis deviation. Septal infarct pattern.-Prior Sinus rhythm, left anterior fascicular block, nonspecific ST T-wave changes, no significant change from prior EKG. Echocardiogram 2013: Mild  aortic stenosis, mean gradient 13 mm mercury      12/09/16: - Left ventricle: The cavity size was normal. Wall thickness was   normal. Systolic function was normal. The estimated ejection   fraction was in the range of 50% to 55%. Wall motion was normal;   there were no regional wall motion abnormalities. Doppler   parameters are consistent with abnormal left ventricular   relaxation (grade 1 diastolic dysfunction). - Aortic valve: There was mild stenosis. There was trivial   regurgitation. Mean gradient (S): 17 mm Hg. Peak gradient (S): 32   mm Hg. - Left atrium: The atrium was mildly dilated. - Tricuspid valve: There was moderate regurgitation. - Pulmonary arteries: Systolic pressure was mildly increased. PA   peak pressure: 35 mm Hg (S).  ASSESSMENT AND PLAN:   1. CAD s/p CABG in 2009 - Doing very well.  Compliant with medications.  Continue current regimen, no anginal symptoms.  Excellent. 2. RA- followed by Dr. Lenna Gilford. On Methotrexate. Monitoring liver function. He is doing well. Note that he is also on amiodarone. These 2 medications do have potential liver toxicity side effects. Continue closely monitoring.  Prior LFTs normal. Rheumatology has been following blood work.  3. HTN-was higher in the am hours. Currently under great control. He is not dizzy currently.  Doing very well. 4. Paroxysmal supraventricular tachycardia-currently well controlled with amiodarone. This is after hospitalization in December 2015. He was seen by EP and has opted for noninvasive management. Prior episode of PSVT occurred post endoscopy. Valsalva maneuver.  Continue amiodarone and metoprolol.  No changes  made 5. Anterior fascicular block-currently monitoring, no syncope. 6. Sinoatrial node dysfunction-prior bradycardia. No significant symptoms currently. 7. Frequent PVCs-bigeminy pattern previously seen. Amiodarone has halted PVCs.  Low-dose metoprolol. 8. Old myocardial infarction-inferior wall,  stable 9. Coronary artery disease-prior bypass surgery 2009-secondary prevention, no change 10. Mild aortic stenosis-continue to monitor, echocardiogram 2015 and 2017 with mild aortic stenosis. Murmur appreciated.  We will hold off on repeating echocardiogram. 11. Hyperlipidemia-Crestor. LDL GOAL 70. Dr. Inda Merlin has been monitoring. 12. Possible Congenital bicuspid aortic valve.  Stable 13. 1 year follow-up. Continue to monitor liver function, thyroid.  Takes methotrexate.  Takes amiodarone.  Signed, Candee Furbish, MD [facility]  11/24/2017 3:05 PM

## 2017-11-24 NOTE — Patient Instructions (Signed)
Medication Instructions:  Your physician recommends that you continue on your current medications as directed. Please refer to the Current Medication list given to you today.  Labwork: None ordered  Testing/Procedures: None ordered   Follow-Up: Your physician wants you to follow-up in: 1 year with Dr. Marlou Porch. You will receive a reminder letter in the mail two months in advance. If you don't receive a letter, please call our office to schedule the follow-up appointment.  Any Other Special Instructions Will Be Listed Below (If Applicable).     If you need a refill on your cardiac medications before your next appointment, please call your pharmacy.

## 2018-03-14 ENCOUNTER — Other Ambulatory Visit: Payer: Self-pay | Admitting: Cardiology

## 2018-11-27 ENCOUNTER — Encounter (INDEPENDENT_AMBULATORY_CARE_PROVIDER_SITE_OTHER): Payer: Self-pay

## 2018-11-27 ENCOUNTER — Ambulatory Visit: Payer: Medicare PPO | Admitting: Cardiology

## 2018-11-27 ENCOUNTER — Encounter: Payer: Self-pay | Admitting: Cardiology

## 2018-11-27 VITALS — BP 132/64 | HR 76 | Ht 69.0 in | Wt 143.8 lb

## 2018-11-27 DIAGNOSIS — R0602 Shortness of breath: Secondary | ICD-10-CM

## 2018-11-27 DIAGNOSIS — Z79899 Other long term (current) drug therapy: Secondary | ICD-10-CM

## 2018-11-27 DIAGNOSIS — I350 Nonrheumatic aortic (valve) stenosis: Secondary | ICD-10-CM

## 2018-11-27 DIAGNOSIS — I471 Supraventricular tachycardia: Secondary | ICD-10-CM

## 2018-11-27 DIAGNOSIS — I252 Old myocardial infarction: Secondary | ICD-10-CM

## 2018-11-27 DIAGNOSIS — I2510 Atherosclerotic heart disease of native coronary artery without angina pectoris: Secondary | ICD-10-CM

## 2018-11-27 MED ORDER — METOPROLOL SUCCINATE ER 25 MG PO TB24: 25.0000 mg | ORAL_TABLET | Freq: Every day | ORAL | 6 refills | Status: AC

## 2018-11-27 NOTE — Progress Notes (Signed)
Cardiology Office Note:    Date:  11/27/2018   ID:  Seth Simpson, DOB 12-05-1931, MRN [id number]  PCP:  Josetta Huddle, MD  Cardiologist:  No primary care provider on file.  Electrophysiologist:  None   Referring MD: Josetta Huddle, MD     History of Present Illness:    Seth Simpson is a 84 y.o. male here for the follow-up of coronary artery disease status post CABG April 2009, prior MI inferior, LAFB, PVCs, mild aortic stenosis with possible bicuspid valve, hyperlipidemia here for follow-up.  Back in 2015 had incessant SVT.  This was terminated with carotid massage.  Adenosine and diltiazem have helped in the past.  Tachycardia then returned and he saw Dr. Caryl Comes in [place].  He was placed on IV amiodarone and was considered for catheter ablation.  He preferred medication approach.  Has been stable on amiodarone.  Has had some trouble in the past with statin intolerance, taking periodic Crestor.  Past Medical History:  Diagnosis Date  . Bicuspid aortic valve   . Bladder tumor   . BPH (benign prostatic hypertrophy)   . Coronary artery disease cardiologist-  dr Marlou Porch   a. s/p CABG  x5  03/2008 - presentation acute STEMI  . Depression   . Erectile dysfunction   . GERD (gastroesophageal reflux disease)   . Gout    per pt 07-14-2017 stable  . Heart murmur   . Hiatal hernia   . History of bladder stone   . History of kidney stones   . History of prostatitis    and hx orchitis  . History of ST elevation myocardial infarction (STEMI)    04-10-2008  acute inferior  . History of syncope    2011 sinoatrial node dysfunction  and 11-20-2014 presyncope w/ wide complex tachycardia resolved  w/ valvasa and adenosine  . Hyperlipidemia   . Hypertension   . Iron deficiency anemia   . LAFB (left anterior fascicular block)   . Lower urinary tract symptoms (LUTS)   . Mild aortic stenosis    echo 04-23-2014  Valve Area 1.69 cm^2;  last echo 12-09-2016 valve area 1.47cm^2  . OA  (osteoarthritis)   . PSVT (paroxysmal supraventricular tachycardia) ([place])   . PVC's (premature ventricular contractions)   . Rheumatoid arthritis ([place]) 10/02/2013   Dr. Lenna Gilford, methotrexate  . S/P CABG x 5    04-11-2008  . Sigmoid diverticulosis   . Type II diabetes mellitus ([place])   . Wears glasses     Past Surgical History:  Procedure Laterality Date  . APPENDECTOMY  1954  . BALLOON DILATION  04/06/2012   Procedure: BALLOON DILATION;  Surgeon: Garlan Fair, MD;  Location: [facility];  Service: Endoscopy;  Laterality: N/A;  . CARDIAC CATHETERIZATION  04-10-2008  dr Daneen Schick   3 vessel CAD  . CATARACT EXTRACTION W/ INTRAOCULAR LENS  IMPLANT, BILATERAL Bilateral 2003  . CHOLECYSTECTOMY OPEN  1978  . COLONOSCOPY  04/06/2012   Procedure: COLONOSCOPY;  Surgeon: Garlan Fair, MD;  Location: [facility];  Service: Endoscopy;  Laterality: N/A;  . CORONARY ARTERY BYPASS GRAFT  04-11-2008   LIMA to RI,  SVG to D1,  SVG to LAD,  sequetial SVG to Acute Marginal and pRCA  . CYSTOSCOPY W/ RETROGRADES Bilateral 07/26/2017   Procedure: CYSTOSCOPY WITH BILATERAL  RETROGRADE PYELOGRAM;  Surgeon: Irine Seal, MD;  Location: [facility];  Service: Urology;  Laterality: Bilateral;  . CYSTOSCOPY WITH BIOPSY N/A 06/17/2016   Procedure:  CYSTOSCOPY WITH BIOPSY;  Surgeon: Irine Seal, MD;  Location: [facility];  Service: Urology;  Laterality: N/A;  . CYSTOSCOPY WITH BIOPSY N/A 07/26/2017   Procedure: urethral dilation CYSTOSCOPY WITH BIOPSY AND FULGURATION, removal of bladder stone;  Surgeon: Irine Seal, MD;  Location: [facility];  Service: Urology;  Laterality: N/A;  . CYSTOSCOPY WITH LITHOLAPAXY N/A 06/17/2016   Procedure: CYSTOSCOPY WITH LITHOLAPAXY;  Surgeon: Irine Seal, MD;  Location: [facility];  Service: Urology;  Laterality: N/A;  . CYSTOSCOPY WITH RETROGRADE PYELOGRAM, URETEROSCOPY AND STENT PLACEMENT  06-17-2016  dr Irine Seal    Cysto/  Bilateral RTG's/  Bladder bx and fulgeration bladder tumor's left & right lateral wall's/  Litholapaxy bladder stone/  left ureteroscopy/  Insertion bilateral double J stents  . CYSTOSCOPY WITH RETROGRADE PYELOGRAM, URETEROSCOPY AND STENT PLACEMENT Bilateral 06/17/2016   Procedure: CYSTOSCOPY BILATERAL RETROGRADE,  LEFT URETEROSCOPY AND BILATERAL STENT PLACEMENT;  Surgeon: Irine Seal, MD;  Location: [facility];  Service: Urology;  Laterality: Bilateral;  . CYSTOSCOPY WITH URETEROSCOPY AND STENT PLACEMENT Bilateral 07/01/2016   Procedure: BILATERAL URETEROSCOPY STONE EXTRACTIONS WITH POSSIBLE STENT PLACEMENT;  Surgeon: Irine Seal, MD;  Location: [facility];  Service: Urology;  Laterality: Bilateral;  . ESOPHAGOGASTRODUODENOSCOPY  04/06/2012   Procedure: ESOPHAGOGASTRODUODENOSCOPY (EGD);  Surgeon: Garlan Fair, MD;  Location: [facility];  Service: Endoscopy;  Laterality: N/A;  . FINGER SURGERY Left 2000's   "removed BB", 2nd digit  . HOLMIUM LASER APPLICATION N/A 04/15/6643   Procedure: HOLMIUM LASER APPLICATION;  Surgeon: Irine Seal, MD;  Location[facility];  Service: Urology;  Laterality: N/A;  . HOLMIUM LASER APPLICATION Bilateral 0/34/7425   Procedure: HOLMIUM LASER APPLICATION;  Surgeon: Irine Seal, MD;  Location: [facility];  Service: Urology;  Laterality: Bilateral;  . TOTAL KNEE ARTHROPLASTY Bilateral right 03-20-2008//  left 08-23-2007  . TRANSTHORACIC ECHOCARDIOGRAM  12-09-2016 dr Marlou Porch   mild LVH,  ef 50-55%/  Bicuspid AV with stenosis, valve area 1.47 cm^2 (previous echo it was 1.67cm^2 on 04-23-2014), trivial regurg/  mild LAE/ mild MR/  moderate TR, PASP 50mmHg/     Current Medications: Current Meds  Medication Sig  . ACCU-CHEK AVIVA PLUS test strip   . ACCU-CHEK SOFTCLIX LANCETS lancets USE TO TEST BLOOD SUGAR DAILY  . acetaminophen (TYLENOL) 500 MG tablet Take 500 mg by mouth every 6 (six) hours as needed  for mild pain, moderate pain, fever or headache.  . allopurinol (ZYLOPRIM) 300 MG tablet Take 300 mg by mouth every evening.   Marland Kitchen amiodarone (PACERONE) 200 MG tablet TAKE 1 TABLET EVERY DAY  . amLODipine-benazepril (LOTREL) 5-20 MG per capsule Take 1 capsule by mouth every evening.   Marland Kitchen aspirin 81 MG tablet Take 81 mg by mouth every morning.   Marland Kitchen CHERRY CONCENTRATE PO Take 1 tablet by mouth daily. OTC-Tart Cherry Extract tablet  . Cholecalciferol (VITAMIN D3) 2000 units TABS Take 1 tablet by mouth every evening.  . colchicine 0.6 MG tablet Take 0.6 mg by mouth daily as needed (gout).   Marland Kitchen diphenhydrAMINE (BENADRYL) 25 MG tablet Take 25 mg by mouth daily as needed for allergies. Reported on 06/19/2016  . ferrous sulfate 325 (65 FE) MG tablet Take 650 mg by mouth every evening.   . finasteride (PROSCAR) 5 MG tablet Take 5 mg by mouth every evening.   . Flaxseed, Linseed, (FLAXSEED OIL MAX STR) 1300 MG CAPS Take 1 capsule by mouth every evening.   Marland Kitchen  folic acid (FOLVITE) 1 MG tablet Take 2 mg by mouth every morning.   . loratadine (CLARITIN) 10 MG tablet Take 10 mg by mouth every morning.   . metFORMIN (GLUCOPHAGE) 500 MG tablet Take 500 mg by mouth 2 (two) times daily with a meal.   . methotrexate (RHEUMATREX) 2.5 MG tablet Take 10 mg by mouth every Friday. Take on Fridays.  Caution:Chemotherapy. Protect from light.  . metoprolol succinate (TOPROL-XL) 25 MG 24 hr tablet Take 1 tablet (25 mg total) by mouth daily.  . Multiple Vitamin (MULTIVITAMIN WITH MINERALS) TABS tablet Take 1 tablet by mouth every evening.   . naproxen (NAPRELAN) 500 MG 24 hr tablet Take 500 mg by mouth daily as needed (Pain).   Marland Kitchen omeprazole (PRILOSEC OTC) 20 MG tablet Take 10 mg by mouth as needed.   . rosuvastatin (CRESTOR) 10 MG tablet Take 10 mg by mouth every Monday, Wednesday, and Friday.   . sertraline (ZOLOFT) 100 MG tablet Take 50 mg by mouth every morning.   . sildenafil (VIAGRA) 100 MG tablet Take 100 mg by mouth daily as  needed for erectile dysfunction.  . tamsulosin (FLOMAX) 0.4 MG CAPS capsule Take 0.4 mg by mouth daily.  Marland Kitchen terazosin (HYTRIN) 5 MG capsule Take 5 mg by mouth at bedtime.   . triamcinolone (NASACORT ALLERGY 24HR) 55 MCG/ACT AERO nasal inhaler Place 2 sprays into the nose daily as needed (Nasal congestion).  . [DISCONTINUED] metoprolol succinate (TOPROL-XL) 25 MG 24 hr tablet Take 1 tablet (25 mg total) by mouth daily. (Patient taking differently: Take 25 mg by mouth every morning. )     Allergies:   Peanut-containing drug products; Hydrochlorothiazide; Other; Pistachio nut (diagnostic); Codeine; and Fruit & vegetable daily [nutritional supplements]   Social History   Socioeconomic History  . Marital status: Married    Spouse name: Not on file  . Number of children: Not on file  . Years of education: Not on file  . Highest education level: Not on file  Occupational History  . Not on file  Social Needs  . Financial resource strain: Not on file  . Food insecurity:    Worry: Not on file    Inability: Not on file  . Transportation needs:    Medical: Not on file    Non-medical: Not on file  Tobacco Use  . Smoking status: Former Smoker    Packs/day: 1.00    Years: 15.00    Pack years: 15.00    Types: Cigarettes    Last attempt to quit: 09/29/1964    Years since quitting: 54.1  . Smokeless tobacco: Never Used  Substance and Sexual Activity  . Alcohol use: Yes    Alcohol/week: 2.0 standard drinks    Types: 2 Glasses of wine per week  . Drug use: No  . Sexual activity: Not on file  Lifestyle  . Physical activity:    Days per week: Not on file    Minutes per session: Not on file  . Stress: Not on file  Relationships  . Social connections:    Talks on phone: Not on file    Gets together: Not on file    Attends religious service: Not on file    Active member of club or organization: Not on file    Attends meetings of clubs or organizations: Not on file    Relationship status:  Not on file  Other Topics Concern  . Not on file  Social History Narrative  . Not  on file     Family History: The patient's family history includes Heart disease in his father.  ROS:   Please see the history of present illness.    All other systems reviewed and are negative.  EKGs/Labs/Other Studies Reviewed:    The following studies were reviewed today:  - LHC (4/09): LAD 50-70%, ostial/proximal D1 70-75%, proximal RI 95%, CFX subtotally occluded, RCA 95% >> CABG - Echo (5/15): Mild LVH, EF 60-65%, normal wall motion, mild AS (mean 12 mmHg) , mild MR  EKG:  EKG is  ordered today.  The ekg ordered today demonstrates sinus rhythm 76 left bundle branch block 10/22/14-sinus bradycardia rate 58 with frequent PVCs in bigeminy pattern, left axis deviation. Septal infarct pattern.-Prior Sinus rhythm, left anterior fascicular block, nonspecific ST T-wave changes, no significant change from prior EKG.    Recent Labs: No results found for requested labs within last 8760 hours.  Recent Lipid Panel    Component Value Date/Time   CHOL 84 11/21/2014 0621   TRIG 82 11/21/2014 0621   HDL 24 (L) 11/21/2014 0621   CHOLHDL 3.5 11/21/2014 0621   VLDL 16 11/21/2014 0621   LDLCALC 44 11/21/2014 0621    Physical Exam:    VS:  BP 132/64   Pulse 76   Ht 5\' 9"  (1.753 m)   Wt 143 lb 12.8 oz (65.2 kg)   SpO2 92%   BMI 21.24 kg/m     Wt Readings from Last 3 Encounters:  11/27/18 143 lb 12.8 oz (65.2 kg)  11/24/17 146 lb 12.8 oz (66.6 kg)  07/26/17 139 lb (63 kg)     GEN:  Well nourished, well developed in no acute distress HEENT: Normal NECK: No JVD; No carotid bruits LYMPHATICS: No lymphadenopathy CARDIAC: RRR, 3/6 SM, no rubs, gallops RESPIRATORY:  Clear to auscultation without rales, wheezing or rhonchi  ABDOMEN: Soft, non-tender, non-distended MUSCULOSKELETAL:  No edema; No deformity  SKIN: Warm and dry NEUROLOGIC:  Alert and oriented x 3 PSYCHIATRIC:  Normal affect    ASSESSMENT:    1. Coronary artery disease involving native coronary artery of native heart without angina pectoris   2. Mild aortic stenosis   3. SVT (supraventricular tachycardia) ([place])   4. Old MI (myocardial infarction)   5. Long term current use of amiodarone   6. SOB (shortness of breath)    PLAN:    In order of problems listed above:  Coronary artery disease status post CABG 2009 -No anginal symptoms doing well medications reviewed.  Paroxysmal supraventricular tachycardia - Amiodarone.  Started in 2015.  Saw Dr. Caryl Comes and Dr. Rayann Heman.  Opted for noninvasive management.  TSH 2.7 potassium 4.5 hemoglobin 13.7. Does get chills and some dyspnea. Stopping metopolol. Been off for a while.   Amiodarone use -TSH was normal.  ALT 17.  I will go ahead and check a chest x-ray given some of his shortness of breath he has been experiencing.  This certainly is not debilitating by any means.  I will also repeat echocardiogram.  LAFB - No syncope.  Watch with amiodarone.  Sinoatrial node dysfunction - Prior bradycardia.  Monitor.  Frequent PVCs -Prior bigeminy pattern.  Amiodarone has helped out significantly.  Mild aortic stenosis - Echo 2017  - Left ventricle: The cavity size was normal. Wall thickness was   normal. Systolic function was normal. The estimated ejection   fraction was in the range of 50% to 55%. Wall motion was normal;   there were no regional wall  motion abnormalities. Doppler   parameters are consistent with abnormal left ventricular   relaxation (grade 1 diastolic dysfunction). - Aortic valve: There was mild stenosis. There was trivial   regurgitation. Mean gradient (S): 17 mm Hg. Peak gradient (S): 32   mm Hg. - Left atrium: The atrium was mildly dilated. - Tricuspid valve: There was moderate regurgitation. - Pulmonary arteries: Systolic pressure was mildly increased. PA   peak pressure: 35 mm Hg (S).  I will repeat echocardiogram.  Murmur seems fairly  significant.   Medication Adjustments/Labs and Tests Ordered: Current medicines are reviewed at length with the patient today.  Concerns regarding medicines are outlined above.  Orders Placed This Encounter  Procedures  . DG Chest 2 View  . EKG 12-Lead  . ECHOCARDIOGRAM COMPLETE   Meds ordered this encounter  Medications  . metoprolol succinate (TOPROL-XL) 25 MG 24 hr tablet    Sig: Take 1 tablet (25 mg total) by mouth daily.    Dispense:  30 tablet    Refill:  6    Patient Instructions  Medication Instructions:  Your physician recommends that you continue on your current medications as directed. Please refer to the Current Medication list given to you today.  If you need a refill on your cardiac medications before your next appointment, please call your pharmacy.   Lab work: None  If you have labs (blood work) drawn today and your tests are completely normal, you will receive your results only by: Marland Kitchen MyChart Message (if you have MyChart) OR . A paper copy in the mail If you have any lab test that is abnormal or we need to change your treatment, we will call you to review the results.  Testing/Procedures: Your physician has requested that you have an echocardiogram. Echocardiography is a painless test that uses sound waves to create images of your heart. It provides your doctor with information about the size and shape of your heart and how well your heart's chambers and valves are working. This procedure takes approximately one hour. There are no restrictions for this procedure.  A chest x-ray takes a picture of the organs and structures inside the chest, including the heart, lungs, and blood vessels. This test can show several things, including, whether the heart is enlarges; whether fluid is building up in the lungs; and whether pacemaker / defibrillator leads are still in place.   Follow-Up: At [facility], you and your health needs are our priority.  As part of our  continuing mission to provide you with exceptional heart care, we have created designated Provider Care Teams.  These Care Teams include your primary Cardiologist (physician) and Advanced Practice Providers (APPs -  Physician Assistants and Nurse Practitioners) who all work together to provide you with the care you need, when you need it. You will need a follow up appointment in 12 months.  Please call our office 2 months in advance to schedule this appointment.  You may see Dr. Marlou Porch or one of the following Advanced Practice Providers on your designated Care Team:   Truitt Merle, NP Cecilie Kicks, NP . Kathyrn Drown, NP  Any Other Special Instructions Will Be Listed Below (If Applicable).       Signed, Candee Furbish, MD  11/27/2018 3:02 PM    [facility]

## 2018-11-27 NOTE — Patient Instructions (Signed)
Medication Instructions:  Your physician recommends that you continue on your current medications as directed. Please refer to the Current Medication list given to you today.  If you need a refill on your cardiac medications before your next appointment, please call your pharmacy.   Lab work: None  If you have labs (blood work) drawn today and your tests are completely normal, you will receive your results only by: Marland Kitchen MyChart Message (if you have MyChart) OR . A paper copy in the mail If you have any lab test that is abnormal or we need to change your treatment, we will call you to review the results.  Testing/Procedures: Your physician has requested that you have an echocardiogram. Echocardiography is a painless test that uses sound waves to create images of your heart. It provides your doctor with information about the size and shape of your heart and how well your heart's chambers and valves are working. This procedure takes approximately one hour. There are no restrictions for this procedure.  A chest x-ray takes a picture of the organs and structures inside the chest, including the heart, lungs, and blood vessels. This test can show several things, including, whether the heart is enlarges; whether fluid is building up in the lungs; and whether pacemaker / defibrillator leads are still in place.   Follow-Up: At [facility] [place], you and your health needs are our priority.  As part of our continuing mission to provide you with exceptional heart care, we have created designated Provider Care Teams.  These Care Teams include your primary Cardiologist (physician) and Advanced Practice Providers (APPs -  Physician Assistants and Nurse Practitioners) who all work together to provide you with the care you need, when you need it. You will need a follow up appointment in 12 months.  Please call our office 2 months in advance to schedule this appointment.  You may see Dr. Marlou Porch or one of the following  Advanced Practice Providers on your designated Care Team:   Truitt Merle, NP Cecilie Kicks, NP . Kathyrn Drown, NP  Any Other Special Instructions Will Be Listed Below (If Applicable).

## 2018-11-28 ENCOUNTER — Ambulatory Visit
Admission: RE | Admit: 2018-11-28 | Discharge: 2018-11-28 | Disposition: A | Discharge status: Home / Self Care | Payer: Medicare PPO | Source: Ambulatory Visit | Attending: Cardiology | Admitting: Cardiology

## 2018-11-28 DIAGNOSIS — Z79899 Other long term (current) drug therapy: Secondary | ICD-10-CM

## 2018-11-28 DIAGNOSIS — R0602 Shortness of breath: Secondary | ICD-10-CM

## 2018-12-01 ENCOUNTER — Other Ambulatory Visit: Payer: Self-pay

## 2018-12-01 ENCOUNTER — Ambulatory Visit (HOSPITAL_COMMUNITY): Payer: Medicare PPO | Attending: Internal Medicine

## 2018-12-01 DIAGNOSIS — I350 Nonrheumatic aortic (valve) stenosis: Secondary | ICD-10-CM | POA: Insufficient documentation

## 2019-01-18 ENCOUNTER — Telehealth: Payer: Self-pay | Admitting: Cardiology

## 2019-01-18 NOTE — Telephone Encounter (Signed)
Patient is wondering if it's okay for him to be taking Z pack with his heart medications. Patient stated he started having lose of balance with the Z pack. Informed patient that he should call the doctor who prescribed his Z pack and let them know of the side effects he has been having. Will send message to Pharm D for advisement.

## 2019-01-18 NOTE — Telephone Encounter (Signed)
Zpak - can prolong Qtc (his was already on the long side in Dec per EKG). Would recommend he discontinue Z pak and contact prescriber for alternative and EKG.

## 2019-01-18 NOTE — Telephone Encounter (Signed)
Called patient back about Pharm D's recommendations. Patient stated he would stop Z pak and contact his PCP, Dr. Inda Merlin about an alternative and EKG.

## 2019-01-18 NOTE — Telephone Encounter (Signed)
Primary nurse not available.  

## 2019-01-18 NOTE — Telephone Encounter (Signed)
New message  Pt c/o medication issue:  1. Name of Medication: C Pack   2. How are you currently taking this medication (dosage and times per day)? N/a   3. Are you having a reaction (difficulty breathing--STAT)?no   4. What is your medication issue? Patient states that he is losing his balance and wants to know how this will medication will interact with heart medication.

## 2019-01-22 NOTE — Telephone Encounter (Signed)
noted 

## 2019-01-25 NOTE — Telephone Encounter (Signed)
Thank you for checking in on this.  Good teamwork. Candee Furbish, MD

## 2019-12-03 ENCOUNTER — Encounter: Payer: Self-pay | Admitting: Cardiology

## 2019-12-03 ENCOUNTER — Other Ambulatory Visit: Payer: Self-pay

## 2019-12-03 ENCOUNTER — Ambulatory Visit (INDEPENDENT_AMBULATORY_CARE_PROVIDER_SITE_OTHER): Payer: Medicare Other | Admitting: Cardiology

## 2019-12-03 VITALS — BP 112/60 | HR 72 | Ht 69.0 in | Wt 144.4 lb

## 2019-12-03 DIAGNOSIS — I2510 Atherosclerotic heart disease of native coronary artery without angina pectoris: Secondary | ICD-10-CM | POA: Diagnosis not present

## 2019-12-03 DIAGNOSIS — I471 Supraventricular tachycardia: Secondary | ICD-10-CM

## 2019-12-03 DIAGNOSIS — I350 Nonrheumatic aortic (valve) stenosis: Secondary | ICD-10-CM

## 2019-12-03 DIAGNOSIS — I252 Old myocardial infarction: Secondary | ICD-10-CM | POA: Diagnosis not present

## 2019-12-03 DIAGNOSIS — Z79899 Other long term (current) drug therapy: Secondary | ICD-10-CM

## 2019-12-03 NOTE — Patient Instructions (Signed)
Medication Instructions:  The current medical regimen is effective;  continue present plan and medications.  *If you need a refill on your cardiac medications before your next appointment, please call your pharmacy*  Follow-Up: At [facility], you and your health needs are our priority.  As part of our continuing mission to provide you with exceptional heart care, we have created designated Provider Care Teams.  These Care Teams include your primary Cardiologist (physician) and Advanced Practice Providers (APPs -  Physician Assistants and Nurse Practitioners) who all work together to provide you with the care you need, when you need it.  Your next appointment:   12 month(s)  The format for your next appointment:   In Person  Provider:   Mark Skains, MD   Thank you for choosing [facility]!!     

## 2019-12-03 NOTE — Progress Notes (Signed)
Cardiology Office Note:    Date:  12/03/2019   ID:  Seth Simpson, DOB [birth year], MRN [id number]  PCP:  Josetta Huddle, MD  Cardiologist:  Candee Furbish, MD  Electrophysiologist:  None   Referring MD: Josetta Huddle, MD     History of Present Illness:    Seth Simpson is a 85 y.o. male here for the follow-up of coronary artery disease status post CABG April 2009, prior MI inferior, LAFB, PVCs, mild aortic stenosis, hyperlipidemia here for follow-up.  Back in 2015 had incessant SVT.  This was terminated with carotid massage.  Adenosine and diltiazem have helped in the past.    Tachycardia then returned and he saw Dr. Caryl Comes in [place].  He was placed on IV amiodarone and was considered for catheter ablation.  He preferred medication approach.  Has been stable on amiodarone.  Has had some trouble in the past with statin intolerance, taking periodic Crestor.  12/03/2019-here for follow-up of SVT and CAD.  Mild aortic stenosis is stable on last year's echocardiogram.  Feels well, no fevers chills nausea vomiting syncope bleeding.  Medication compliance.  Overall he feels well.  He is to be neighbors with P.J. Hines, RN who worked at [employer].  Shoulder pain is his main complaint.  LDL 53 hemoglobin A1c 6.8 hemoglobin 13.8 creatinine 1.3 potassium 4.6 ALT 16 TSH 2.9  Past Medical History:  Diagnosis Date  . Bicuspid aortic valve   . Bladder tumor   . BPH (benign prostatic hypertrophy)   . Coronary artery disease cardiologist-  dr Marlou Porch   a. s/p CABG  x5  03/2008 - presentation acute STEMI  . Depression   . Erectile dysfunction   . GERD (gastroesophageal reflux disease)   . Gout    per pt 07-14-2017 stable  . Heart murmur   . Hiatal hernia   . History of bladder stone   . History of kidney stones   . History of prostatitis    and hx orchitis  . History of ST elevation myocardial infarction (STEMI)    04-10-2008  acute inferior  . History of syncope    2011 sinoatrial node dysfunction   and 11-20-2014 presyncope w/ wide complex tachycardia resolved  w/ valvasa and adenosine  . Hyperlipidemia   . Hypertension   . Iron deficiency anemia   . LAFB (left anterior fascicular block)   . Lower urinary tract symptoms (LUTS)   . Mild aortic stenosis    echo 04-23-2014  Valve Area 1.69 cm^2;  last echo 12-09-2016 valve area 1.47cm^2  . OA (osteoarthritis)   . PSVT (paroxysmal supraventricular tachycardia) ([place])   . PVC's (premature ventricular contractions)   . Rheumatoid arthritis ([place]) 10/02/2013   Dr. Lenna Gilford, methotrexate  . S/P CABG x 5    04-11-2008  . Sigmoid diverticulosis   . Type II diabetes mellitus ([place])   . Wears glasses     Past Surgical History:  Procedure Laterality Date  . APPENDECTOMY  1954  . BALLOON DILATION  04/06/2012   Procedure: BALLOON DILATION;  Surgeon: Garlan Fair, MD;  Location: [facility];  Service: Endoscopy;  Laterality: N/A;  . CARDIAC CATHETERIZATION  04-10-2008  dr Daneen Schick   3 vessel CAD  . CATARACT EXTRACTION W/ INTRAOCULAR LENS  IMPLANT, BILATERAL Bilateral 2003  . CHOLECYSTECTOMY OPEN  1978  . COLONOSCOPY  04/06/2012   Procedure: COLONOSCOPY;  Surgeon: Garlan Fair, MD;  Location: [facility];  Service: Endoscopy;  Laterality: N/A;  . CORONARY ARTERY  BYPASS GRAFT  04-11-2008   LIMA to RI,  SVG to D1,  SVG to LAD,  sequetial SVG to Acute Marginal and pRCA  . CYSTOSCOPY W/ RETROGRADES Bilateral 07/26/2017   Procedure: CYSTOSCOPY WITH BILATERAL  RETROGRADE PYELOGRAM;  Surgeon: Irine Seal, MD;  Location: [facility];  Service: Urology;  Laterality: Bilateral;  . CYSTOSCOPY WITH BIOPSY N/A 06/17/2016   Procedure: CYSTOSCOPY WITH BIOPSY;  Surgeon: Irine Seal, MD;  Location[facility];  Service: Urology;  Laterality: N/A;  . CYSTOSCOPY WITH BIOPSY N/A 07/26/2017   Procedure: urethral dilation CYSTOSCOPY WITH BIOPSY AND FULGURATION, removal of bladder stone;  Surgeon: Irine Seal, MD;  Location:  [facility];  Service: Urology;  Laterality: N/A;  . CYSTOSCOPY WITH LITHOLAPAXY N/A 06/17/2016   Procedure: CYSTOSCOPY WITH LITHOLAPAXY;  Surgeon: Irine Seal, MD;  Location: [facility];  Service: Urology;  Laterality: N/A;  . CYSTOSCOPY WITH RETROGRADE PYELOGRAM, URETEROSCOPY AND STENT PLACEMENT  06-17-2016  dr Irine Seal   Cysto/  Bilateral RTG's/  Bladder bx and fulgeration bladder tumor's left & right lateral wall's/  Litholapaxy bladder stone/  left ureteroscopy/  Insertion bilateral double J stents  . CYSTOSCOPY WITH RETROGRADE PYELOGRAM, URETEROSCOPY AND STENT PLACEMENT Bilateral 06/17/2016   Procedure: CYSTOSCOPY BILATERAL RETROGRADE,  LEFT URETEROSCOPY AND BILATERAL STENT PLACEMENT;  Surgeon: Irine Seal, MD;  Location:  [facility];  Service: Urology;  Laterality: Bilateral;  . CYSTOSCOPY WITH URETEROSCOPY AND STENT PLACEMENT Bilateral 07/01/2016   Procedure: BILATERAL URETEROSCOPY STONE EXTRACTIONS WITH POSSIBLE STENT PLACEMENT;  Surgeon: Irine Seal, MD;  Location: [facility];  Service: Urology;  Laterality: Bilateral;  . ESOPHAGOGASTRODUODENOSCOPY  04/06/2012   Procedure: ESOPHAGOGASTRODUODENOSCOPY (EGD);  Surgeon: Garlan Fair, MD;  Location: [facility];  Service: Endoscopy;  Laterality: N/A;  . FINGER SURGERY Left 2000's   "removed BB", 2nd digit  . HOLMIUM LASER APPLICATION N/A [id number]   Procedure: HOLMIUM LASER APPLICATION;  Surgeon: Irine Seal, MD;  Location: [facility];  Service: Urology;  Laterality: N/A;  . HOLMIUM LASER APPLICATION Bilateral [id number]   Procedure: HOLMIUM LASER APPLICATION;  Surgeon: Irine Seal, MD;  Location: [facility];  Service: Urology;  Laterality: Bilateral;  . TOTAL KNEE ARTHROPLASTY Bilateral right 03-20-2008//  left 08-23-2007  . TRANSTHORACIC ECHOCARDIOGRAM  12-09-2016 dr Marlou Porch   mild LVH,  ef 50-55%/  Bicuspid AV with stenosis, valve area 1.47 cm^2  (previous echo it was 1.67cm^2 on 04-23-2014), trivial regurg/  mild LAE/ mild MR/  moderate TR, PASP 76mmHg/     Current Medications: Current Meds  Medication Sig  . ACCU-CHEK AVIVA PLUS test strip   . ACCU-CHEK SOFTCLIX LANCETS lancets USE TO TEST BLOOD SUGAR DAILY  . acetaminophen (TYLENOL) 500 MG tablet Take 500 mg by mouth every 6 (six) hours as needed for mild pain, moderate pain, fever or headache.  . allopurinol (ZYLOPRIM) 300 MG tablet Take 300 mg by mouth every evening.   Marland Kitchen amiodarone (PACERONE) 200 MG tablet TAKE 1 TABLET EVERY DAY  . amLODipine-benazepril (LOTREL) 5-20 MG per capsule Take 1 capsule by mouth as needed.   Marland Kitchen CHERRY CONCENTRATE PO Take 1 tablet by mouth daily. OTC-Tart Cherry Extract tablet  . Cholecalciferol (VITAMIN D3) 2000 units TABS Take 1 tablet by mouth every evening.  . colchicine 0.6 MG tablet Take 0.6 mg by mouth daily as needed (gout).   Marland Kitchen diphenhydrAMINE (BENADRYL) 25 MG tablet Take 25 mg by mouth daily as needed for allergies.  Reported on 06/19/2016  . ferrous sulfate 325 (65 FE) MG tablet Take 650 mg by mouth every evening.   . finasteride (PROSCAR) 5 MG tablet Take 5 mg by mouth daily.  . Flaxseed, Linseed, (FLAXSEED OIL MAX STR) 1300 MG CAPS Take 1 capsule by mouth every evening.   . folic acid (FOLVITE) 1 MG tablet Take 2 mg by mouth every morning.   Marland Kitchen levothyroxine (SYNTHROID) 50 MCG tablet Take 50 mcg by mouth daily.  Marland Kitchen loratadine (CLARITIN) 10 MG tablet Take 10 mg by mouth every morning.   . metFORMIN (GLUCOPHAGE) 500 MG tablet Take 500 mg by mouth 2 (two) times daily with a meal.   . methotrexate (RHEUMATREX) 2.5 MG tablet Take 10 mg by mouth every Friday. Take on Fridays.  Caution:Chemotherapy. Protect from light.  . metoprolol succinate (TOPROL-XL) 25 MG 24 hr tablet Take 1 tablet (25 mg total) by mouth daily.  . Multiple Vitamin (MULTIVITAMIN WITH MINERALS) TABS tablet Take 1 tablet by mouth every evening.   . naproxen (NAPRELAN) 500 MG 24 hr  tablet Take 500 mg by mouth daily as needed (Pain).   Marland Kitchen omeprazole (PRILOSEC OTC) 20 MG tablet Take 10 mg by mouth as needed.   . rosuvastatin (CRESTOR) 10 MG tablet Take 10 mg by mouth every Monday, Wednesday, and Friday.   . sertraline (ZOLOFT) 100 MG tablet Take 50 mg by mouth every morning.   . sildenafil (VIAGRA) 100 MG tablet Take 100 mg by mouth daily as needed for erectile dysfunction.  . tamsulosin (FLOMAX) 0.4 MG CAPS capsule Take 0.4 mg by mouth daily.  Marland Kitchen terazosin (HYTRIN) 5 MG capsule Take 5 mg by mouth at bedtime.   . triamcinolone (NASACORT ALLERGY 24HR) 55 MCG/ACT AERO nasal inhaler Place 2 sprays into the nose daily as needed (Nasal congestion).     Allergies:   Peanut-containing drug products, Hydrochlorothiazide, Other, Pistachio nut (diagnostic), Codeine, and Fruit & vegetable daily [nutritional supplements]   Social History   Socioeconomic History  . Marital status: Married    Spouse name: Not on file  . Number of children: Not on file  . Years of education: Not on file  . Highest education level: Not on file  Occupational History  . Not on file  Tobacco Use  . Smoking status: Former Smoker    Packs/day: 1.00    Years: 15.00    Pack years: 15.00    Types: Cigarettes    Quit date: 09/29/1964    Years since quitting: 55.2  . Smokeless tobacco: Never Used  Substance and Sexual Activity  . Alcohol use: Yes    Alcohol/week: 2.0 standard drinks    Types: 2 Glasses of wine per week  . Drug use: No  . Sexual activity: Not on file  Other Topics Concern  . Not on file  Social History Narrative  . Not on file   Social Determinants of Health   Financial Resource Strain:   . Difficulty of Paying Living Expenses: Not on file  Food Insecurity:   . Worried About Charity fundraiser in the Last Year: Not on file  . Ran Out of Food in the Last Year: Not on file  Transportation Needs:   . Lack of Transportation (Medical): Not on file  . Lack of Transportation  (Non-Medical): Not on file  Physical Activity:   . Days of Exercise per Week: Not on file  . Minutes of Exercise per Session: Not on file  Stress:   .  Feeling of Stress : Not on file  Social Connections:   . Frequency of Communication with Friends and Family: Not on file  . Frequency of Social Gatherings with Friends and Family: Not on file  . Attends Religious Services: Not on file  . Active Member of Clubs or Organizations: Not on file  . Attends Archivist Meetings: Not on file  . Marital Status: Not on file     Family History: The patient's family history includes Heart disease in his father.  ROS:   Please see the history of present illness.    All other systems reviewed and are negative.  EKGs/Labs/Other Studies Reviewed:    The following studies were reviewed today:   LHC (4/09): LAD 50-70%, ostial/proximal D1 70-75%, proximal RI 95%, CFX subtotally occluded, RCA 95% >> CABG - Echo (5/15): Mild LVH, EF 60-65%, normal wall motion, mild AS (mean 12 mmHg) , mild MR  ECHO 12/01/18  - Left ventricle: The cavity size was normal. Wall thickness was   increased in a pattern of mild LVH. Systolic function was normal.   The estimated ejection fraction was in the range of 55% to 60%.   Wall motion was normal; there were no regional wall motion   abnormalities. Doppler parameters are consistent with abnormal   left ventricular relaxation (grade 1 diastolic dysfunction). - Ventricular septum: Abnormal septal motion, likely secondary to   post-operative state and conduction delay. - Aortic valve: There was mild stenosis. There was trivial   regurgitation. Mean gradient (S): 16 mm Hg. - Mitral valve: Mildly thickened leaflets . There was mild   regurgitation. - Left atrium: The atrium was normal in size. - Right ventricle: The cavity size was normal. Wall thickness was   normal. Systolic function was mildly reduced. RV systolic   pressure (S, est): 38 mm Hg. -  Right atrium: The atrium was normal in size. Central venous   pressure (est): 3 mm Hg. - Tricuspid valve: There was mild regurgitation. - Pulmonary arteries: Systolic pressure was mildly to moderately   increased. - Pericardium, extracardiac: There was no pericardial effusion.  EKG:  EKG is ordered today.  The ekg ordered today demonstrates 12/03/2019 normal sinus rhythm 65 no other abnormalities.  Prior sinus rhythm 76 left bundle branch block 10/22/14-sinus bradycardia rate 58 with frequent PVCs in bigeminy pattern, left axis deviation. Septal infarct pattern.-Prior Sinus rhythm, left anterior fascicular block, nonspecific ST T-wave changes, no significant change from prior EKG.    Recent Labs: No results found for requested labs within last 8760 hours.  Recent Lipid Panel    Component Value Date/Time   CHOL 84 11/21/2014 0621   TRIG 82 11/21/2014 0621   HDL 24 (L) 11/21/2014 0621   CHOLHDL 3.5 11/21/2014 0621   VLDL 16 11/21/2014 0621   LDLCALC 44 11/21/2014 0621    Physical Exam:    VS:  BP 112/60   Pulse 72   Ht 5\' 9"  (1.753 m)   Wt 144 lb 6.4 oz (65.5 kg)   SpO2 97%   BMI 21.32 kg/m     Wt Readings from Last 3 Encounters:  12/03/19 144 lb 6.4 oz (65.5 kg)  11/27/18 143 lb 12.8 oz (65.2 kg)  11/24/17 146 lb 12.8 oz (66.6 kg)    GEN: Well nourished, well developed, in no acute distress  HEENT: normal  Neck: no JVD, carotid bruits, or masses Cardiac: RRR; 3/6 SM, no rubs, or gallops,no edema  Respiratory:  clear to auscultation  bilaterally, normal work of breathing GI: soft, nontender, nondistended, + BS MS: no deformity or atrophy  Skin: warm and dry, no rash Neuro:  Alert and Oriented x 3, Strength and sensation are intact Psych: euthymic mood, full affect   ASSESSMENT:    1. Coronary artery disease involving native coronary artery of native heart without angina pectoris   2. Mild aortic stenosis   3. SVT (supraventricular tachycardia) ([place])   4. Old MI  (myocardial infarction)   5. Long term current use of amiodarone    PLAN:    In order of problems listed above:  Coronary artery disease status post CABG 2009 -Once again, doing very well without any anginal symptoms, medications reviewed.  Paroxysmal supraventricular tachycardia - Amiodarone.  Started in 2015.  Saw Dr. Caryl Comes and Dr. Rayann Heman.  Opted for noninvasive management for SVT.  TSH normal, potassium was normal.  Had previously stopped metopolol. Been off for a while.   Amiodarone use -TSH has been normal, liver enzymes have been normal.  Chest x-ray in 2019 was normal.  Continue.  LBBB - No syncope.  Watch with amiodarone.  Stable.  If conduction were to deteriorate further, pacemaker may be warranted.  Sinoatrial node dysfunction - Prior bradycardia.  Monitor.  No changes.  Frequent PVCs -Prior bigeminy pattern.  Amiodarone has helped out significantly.  Doing well.  Mild aortic stenosis -No significant change in aortic valve.  Murmur appreciated on exam.  Medication Adjustments/Labs and Tests Ordered: Current medicines are reviewed at length with the patient today.  Concerns regarding medicines are outlined above.  Orders Placed This Encounter  Procedures  . EKG 12-Lead   No orders of the defined types were placed in this encounter.   Patient Instructions  Medication Instructions:  The current medical regimen is effective;  continue present plan and medications.  *If you need a refill on your cardiac medications before your next appointment, please call your pharmacy*  Follow-Up: At [facility], you and your health needs are our priority.  As part of our continuing mission to provide you with exceptional heart care, we have created designated Provider Care Teams.  These Care Teams include your primary Cardiologist (physician) and Advanced Practice Providers (APPs -  Physician Assistants and Nurse Practitioners) who all work together to provide you with the care  you need, when you need it.  Your next appointment:   12 month(s)  The format for your next appointment:   In Person  Provider:   Candee Furbish, MD  Thank you for choosing [facility]!!        Signed, Candee Furbish, MD  12/03/2019 8:54 AM    [place]

## 2020-01-04 ENCOUNTER — Ambulatory Visit: Payer: Medicare Other | Attending: Internal Medicine

## 2020-01-04 DIAGNOSIS — Z23 Encounter for immunization: Secondary | ICD-10-CM | POA: Insufficient documentation

## 2020-01-04 NOTE — Progress Notes (Signed)
   Covid-19 Vaccination Clinic  Name:  LAKEITH SHAMBAUGH    MRN: [id number] DOB: 30-Mar-1931  01/04/2020  Mr. Arel was observed post Covid-19 immunization for 15 minutes without incidence. He was provided with Vaccine Information Sheet and instruction to access the V-Safe system.   Mr. Bonfante was instructed to call 911 with any severe reactions post vaccine: Marland Kitchen Difficulty breathing  . Swelling of your face and throat  . A fast heartbeat  . A bad rash all over your body  . Dizziness and weakness    Immunizations Administered    Name Date Dose VIS Date Route   Pfizer COVID-19 Vaccine 01/04/2020  8:40 AM 0.3 mL 11/23/2019 Intramuscular   Manufacturer: [place]   Lot: [id number]   [place]: [id number]

## 2020-01-25 ENCOUNTER — Ambulatory Visit: Payer: Medicare Other | Attending: Internal Medicine

## 2020-01-25 DIAGNOSIS — Z23 Encounter for immunization: Secondary | ICD-10-CM

## 2020-01-25 NOTE — Progress Notes (Signed)
   Covid-19 Vaccination Clinic  Name:  Seth Simpson    MRN: [id number] DOB: [birth year]  01/25/2020  [name] was observed post Covid-19 immunization for 15 minutes without incidence. He was provided with Vaccine Information Sheet and instruction to access the V-Safe system.   [name] was instructed to call 911 with any severe reactions post vaccine: Marland Kitchen Difficulty breathing  . Swelling of your face and throat  . A fast heartbeat  . A bad rash all over your body  . Dizziness and weakness    Immunizations Administered    Name Date Dose VIS Date Route   Pfizer COVID-19 Vaccine 01/25/2020  9:31 AM 0.3 mL 11/23/2019 Intramuscular   Manufacturer: [place]   Lot: [id number]   [place]: [id number]

## 2020-12-16 NOTE — Progress Notes (Signed)
Telehealth Visit     Virtual Visit via Telephone Note   This visit type was conducted due to national recommendations for restrictions regarding the COVID-19 Pandemic (e.g. social distancing) in an effort to limit this patient's exposure and mitigate transmission in our community.  Due to his co-morbid illnesses, this patient is at least at moderate risk for complications without adequate follow up.  This format is felt to be most appropriate for this patient at this time.  The patient did not have access to video technology/had technical difficulties with video requiring transitioning to audio format only (telephone).  All issues noted in this document were discussed and addressed.  No physical exam could be performed with this format.  Please refer to the patient's chart for his  consent to telehealth for [facility].   Evaluation Performed:  Follow-up visit   The patient was identified using 2 identifiers.   This visit type was conducted due to national recommendations for restrictions regarding the COVID-19 Pandemic (e.g. social distancing).  This format is felt to be most appropriate for this patient at this time.  All issues noted in this document were discussed and addressed.  No physical exam was performed (except for noted visual exam findings with Video Visits).  Please refer to the patient's chart (MyChart message for video visits and phone note for telephone visits) for the patient's consent to telehealth for [facility].  Date:  12/17/2020   ID:  Seth Simpson, DOB 1931/03/23, MRN [id number]  Patient Location:  Home  Provider location:   [facility] Office  PCP:  Marden Noble, MD  Cardiologist:   Donato Schultz, MD  Electrophysiologist:  None   Chief Complaint:  Follow Up  History of Present Illness:    Seth Simpson is a 87 y.o. male who presents via audio/video conferencing for a telehealth visit today.  Seen for Dr. Anne Fu.   He has a history of known CAD with  prior CABG in 2009, prior inferior MI, LAFB, PVCs, AS and HLD. He had incessant SVT in 2015 terminated with carotid massage. He has had recurrence and was placed on amiodarone. Declined ablation. Has seen Dr. Graciela Husbands. Some degree of statin intolerance. Last echo in December of 2019.   Last seen 13 months ago - felt to be doing ok.   The patient does not have symptoms concerning for COVID-19 infection (fever, chills, cough, or new shortness of breath).   Seen today by telephone visit. He has consented for this visit. He feels good. Walking a mile a day most days. No chest pain. He did have lab back in October noted from the [facility]. No prior EKG noted over the past year. Denies shortness of breath. Not dizzy or lightheaded. He feels like he is doing ok.   Past Medical History:  Diagnosis Date  . Bicuspid aortic valve   . Bladder tumor   . BPH (benign prostatic hypertrophy)   . Coronary artery disease cardiologist-  dr Anne Fu   a. s/p CABG  x5  03/2008 - presentation acute STEMI  . Depression   . Erectile dysfunction   . GERD (gastroesophageal reflux disease)   . Gout    per pt 07-14-2017 stable  . Heart murmur   . Hiatal hernia   . History of bladder stone   . History of kidney stones   . History of prostatitis    and hx orchitis  . History of ST elevation myocardial infarction (STEMI)    04-10-2008  acute inferior  . History of syncope    2011 sinoatrial node dysfunction  and 11-20-2014 presyncope w/ wide complex tachycardia resolved  w/ valvasa and adenosine  . Hyperlipidemia   . Hypertension   . Iron deficiency anemia   . LAFB (left anterior fascicular block)   . Lower urinary tract symptoms (LUTS)   . Mild aortic stenosis    echo 04-23-2014  Valve Area 1.69 cm^2;  last echo 12-09-2016 valve area 1.47cm^2  . OA (osteoarthritis)   . PSVT (paroxysmal supraventricular tachycardia) ([place])   . PVC's (premature ventricular contractions)   . Rheumatoid arthritis ([place]) 10/02/2013   Dr.  Lenna Gilford, methotrexate  . S/P CABG x 5    04-11-2008  . Sigmoid diverticulosis   . Type II diabetes mellitus ([place])   . Wears glasses    Past Surgical History:  Procedure Laterality Date  . APPENDECTOMY  1954  . BALLOON DILATION  04/06/2012   Procedure: BALLOON DILATION;  Surgeon: Garlan Fair, MD;  Location: [facility];  Service: Endoscopy;  Laterality: N/A;  . CARDIAC CATHETERIZATION  04-10-2008  dr Daneen Schick   3 vessel CAD  . CATARACT EXTRACTION W/ INTRAOCULAR LENS  IMPLANT, BILATERAL Bilateral 2003  . CHOLECYSTECTOMY OPEN  1978  . COLONOSCOPY  04/06/2012   Procedure: COLONOSCOPY;  Surgeon: Garlan Fair, MD;  Location: [facility];  Service: Endoscopy;  Laterality: N/A;  . CORONARY ARTERY BYPASS GRAFT  04-11-2008   LIMA to RI,  SVG to D1,  SVG to LAD,  sequetial SVG to Acute Marginal and pRCA  . CYSTOSCOPY W/ RETROGRADES Bilateral 07/26/2017   Procedure: CYSTOSCOPY WITH BILATERAL  RETROGRADE PYELOGRAM;  Surgeon: Irine Seal, MD;  Location[facility];  Service: Urology;  Laterality: Bilateral;  . CYSTOSCOPY WITH BIOPSY N/A 06/17/2016   Procedure: CYSTOSCOPY WITH BIOPSY;  Surgeon: Irine Seal, MD;  Location: [facility];  Service: Urology;  Laterality: N/A;  . CYSTOSCOPY WITH BIOPSY N/A 07/26/2017   Procedure: urethral dilation CYSTOSCOPY WITH BIOPSY AND FULGURATION, removal of bladder stone;  Surgeon: Irine Seal, MD;  Location: [facility];  Service: Urology;  Laterality: N/A;  . CYSTOSCOPY WITH LITHOLAPAXY N/A 06/17/2016   Procedure: CYSTOSCOPY WITH LITHOLAPAXY;  Surgeon: Irine Seal, MD;  Location: [facility];  Service: Urology;  Laterality: N/A;  . CYSTOSCOPY WITH RETROGRADE PYELOGRAM, URETEROSCOPY AND STENT PLACEMENT  06-17-2016  dr Irine Seal   Cysto/  Bilateral RTG's/  Bladder bx and fulgeration bladder tumor's left & right lateral wall's/  Litholapaxy bladder stone/  left ureteroscopy/  Insertion bilateral double J  stents  . CYSTOSCOPY WITH RETROGRADE PYELOGRAM, URETEROSCOPY AND STENT PLACEMENT Bilateral 06/17/2016   Procedure: CYSTOSCOPY BILATERAL RETROGRADE,  LEFT URETEROSCOPY AND BILATERAL STENT PLACEMENT;  Surgeon: Irine Seal, MD;  Location: [facility];  Service: Urology;  Laterality: Bilateral;  . CYSTOSCOPY WITH URETEROSCOPY AND STENT PLACEMENT Bilateral 07/01/2016   Procedure: BILATERAL URETEROSCOPY STONE EXTRACTIONS WITH POSSIBLE STENT PLACEMENT;  Surgeon: Irine Seal, MD;  Location: [facility];  Service: Urology;  Laterality: Bilateral;  . ESOPHAGOGASTRODUODENOSCOPY  04/06/2012   Procedure: ESOPHAGOGASTRODUODENOSCOPY (EGD);  Surgeon: Garlan Fair, MD;  Location: [facility];  Service: Endoscopy;  Laterality: N/A;  . FINGER SURGERY Left 2000's   "removed BB", 2nd digit  . HOLMIUM LASER APPLICATION N/A 0000000   Procedure: HOLMIUM LASER APPLICATION;  Surgeon: Irine Seal, MD;  Location: [facility];  Service: Urology;  Laterality: N/A;  . HOLMIUM LASER APPLICATION  Bilateral 07/01/2016   Procedure: HOLMIUM LASER APPLICATION;  Surgeon: Irine Seal, MD;  Location: [facility];  Service: Urology;  Laterality: Bilateral;  . TOTAL KNEE ARTHROPLASTY Bilateral right 03-20-2008//  left 08-23-2007  . TRANSTHORACIC ECHOCARDIOGRAM  12-09-2016 dr Marlou Porch   mild LVH,  ef 50-55%/  Bicuspid AV with stenosis, valve area 1.47 cm^2 (previous echo it was 1.67cm^2 on 04-23-2014), trivial regurg/  mild LAE/ mild MR/  moderate TR, PASP 37mmHg/      Current Meds  Medication Sig  . ACCU-CHEK AVIVA PLUS test strip   . ACCU-CHEK SOFTCLIX LANCETS lancets USE TO TEST BLOOD SUGAR DAILY  . acetaminophen (TYLENOL) 500 MG tablet Take 500 mg by mouth every 6 (six) hours as needed for mild pain, moderate pain, fever or headache.  . allopurinol (ZYLOPRIM) 300 MG tablet Take 300 mg by mouth every evening.   Marland Kitchen amiodarone (PACERONE) 200 MG tablet TAKE 1 TABLET EVERY DAY  .  amLODipine-benazepril (LOTREL) 5-20 MG per capsule Take 1 capsule by mouth daily.  . Cholecalciferol (VITAMIN D3) 2000 units TABS Take 1 tablet by mouth every evening.  . colchicine 0.6 MG tablet Take 0.6 mg by mouth daily as needed (gout).   Marland Kitchen diphenhydrAMINE (BENADRYL) 25 MG tablet Take 25 mg by mouth daily as needed for allergies. Reported on 06/19/2016  . ferrous sulfate 325 (65 FE) MG tablet Take 650 mg by mouth every evening.   . finasteride (PROSCAR) 5 MG tablet Take 5 mg by mouth daily.  . Flaxseed, Linseed, (FLAXSEED OIL MAX STR) 1300 MG CAPS Take 1 capsule by mouth every evening.   . folic acid (FOLVITE) 1 MG tablet Take 2 mg by mouth every morning.   Marland Kitchen levothyroxine (SYNTHROID) 50 MCG tablet Take 50 mcg by mouth daily.  Marland Kitchen loratadine (CLARITIN) 10 MG tablet Take 10 mg by mouth every morning.   . metFORMIN (GLUCOPHAGE) 500 MG tablet Take 500 mg by mouth 2 (two) times daily with a meal.   . methotrexate (RHEUMATREX) 2.5 MG tablet Take 10 mg by mouth every Friday. Take on Fridays.  Caution:Chemotherapy. Protect from light.  . metoprolol succinate (TOPROL-XL) 25 MG 24 hr tablet Take 1 tablet (25 mg total) by mouth daily.  . Multiple Vitamin (MULTIVITAMIN WITH MINERALS) TABS tablet Take 1 tablet by mouth every evening.   . naproxen (NAPRELAN) 500 MG 24 hr tablet Take 500 mg by mouth daily as needed (Pain).   Marland Kitchen omeprazole (PRILOSEC OTC) 20 MG tablet Take 10 mg by mouth as needed.   . rosuvastatin (CRESTOR) 10 MG tablet Take 10 mg by mouth every Monday, Wednesday, and Friday.   . sertraline (ZOLOFT) 100 MG tablet Take 50 mg by mouth every morning.   . sildenafil (VIAGRA) 100 MG tablet Take 100 mg by mouth daily as needed for erectile dysfunction.  . tamsulosin (FLOMAX) 0.4 MG CAPS capsule Take 0.4 mg by mouth daily.  Marland Kitchen terazosin (HYTRIN) 5 MG capsule Take 5 mg by mouth at bedtime.   . triamcinolone (NASACORT) 55 MCG/ACT AERO nasal inhaler Place 2 sprays into the nose daily as needed (Nasal  congestion).     Allergies:   Peanut allergen powder-dnfp, Peanut-containing drug products, Hydrochlorothiazide, Other, Pistachio nut (diagnostic), Codeine, and Fruit & vegetable daily [nutritional supplements]   Social History   Tobacco Use  . Smoking status: Former Smoker    Packs/day: 1.00    Years: 15.00    Pack years: 15.00    Types: Cigarettes    Quit  date: 09/29/1964    Years since quitting: 56.2  . Smokeless tobacco: Never Used  Substance Use Topics  . Alcohol use: Yes    Alcohol/week: 2.0 standard drinks    Types: 2 Glasses of wine per week  . Drug use: No     Family Hx: The patient's family history includes Heart disease in his father.  ROS:   Please see the history of present illness.   All other systems reviewed are negative.    Objective:    Vital Signs:  BP (!) 167/69   Pulse 67   Ht 5\' 8"  (1.727 m)   Wt 139 lb (63 kg)   BMI 21.13 kg/m    Wt Readings from Last 3 Encounters:  12/17/20 139 lb (63 kg)  12/03/19 144 lb 6.4 oz (65.5 kg)  11/27/18 143 lb 12.8 oz (65.2 kg)    Alert male in no acute distress. He sounds good.    Labs/Other Tests and Data Reviewed:    Lab Results  Component Value Date   WBC 10.9 (H) 06/04/2016   HGB 13.6 07/26/2017   HCT 40.0 07/26/2017   PLT 207 06/04/2016   GLUCOSE 144 (H) 07/26/2017   CHOL 84 11/21/2014   TRIG 82 11/21/2014   HDL 24 (L) 11/21/2014   LDLCALC 44 11/21/2014   ALT 17 02/10/2015   AST 20 02/10/2015   NA 142 07/26/2017   K 4.3 07/26/2017   CL 109 07/26/2017   CREATININE 1.00 07/26/2017   BUN 27 (H) 07/26/2017   CO2 20 (L) 06/04/2016   TSH 4.26 02/10/2015   INR 1.18 11/20/2014   HGBA1C  04/11/2008    5.7 (NOTE)   The ADA recommends the following therapeutic goals for glycemic   control related to Hgb A1C measurement:   Goal of Therapy:   < 7.0% Hgb A1C   Action Suggested:  > 8.0% Hgb A1C   Ref:  Diabetes Care, 22, Suppl. 1, 1999        BNP (last 3 results) No results for input(s): BNP in  the last 8760 hours.  ProBNP (last 3 results) No results for input(s): PROBNP in the last 8760 hours.    Prior CV studies:    The following studies were reviewed today:  LHC (4/09):LAD 50-70%, ostial/proximal D1 70-75%, proximal RI 95%, CFX subtotally occluded, RCA 95% >>CABG - Echo (5/15):Mild LVH, EF 60-65%, normal wall motion, mild AS (mean 12 mmHg) , mild MR  ECHO 12/01/18  - Left ventricle: The cavity size was normal. Wall thickness was increased in a pattern of mild LVH. Systolic function was normal. The estimated ejection fraction was in the range of 55% to 60%. Wall motion was normal; there were no regional wall motion abnormalities. Doppler parameters are consistent with abnormal left ventricular relaxation (grade 1 diastolic dysfunction). - Ventricular septum: Abnormal septal motion, likely secondary to post-operative state and conduction delay. - Aortic valve: There was mild stenosis. There was trivial regurgitation. Mean gradient (S): 16 mm Hg. - Mitral valve: Mildly thickened leaflets . There was mild regurgitation. - Left atrium: The atrium was normal in size. - Right ventricle: The cavity size was normal. Wall thickness was normal. Systolic function was mildly reduced. RV systolic pressure (S, est): 38 mm Hg. - Right atrium: The atrium was normal in size. Central venous pressure (est): 3 mm Hg. - Tricuspid valve: There was mild regurgitation. - Pulmonary arteries: Systolic pressure was mildly to moderately increased. - Pericardium, extracardiac: There was no pericardial effusion.  ASSESSMENT & PLAN:    1. CAD - remote CABG from 2009 - no reports of chest pain or worrisome symptoms. Remains active.   2. AS - does not sound like he has cardinal symptoms - probably needs echo updated.   3. SVT - no complaints of palpitations noted. He remains on amiodarone.   4. High risk medicine - needs EKG - had labs back in October noted.    5. Noted LBBB  Patient Risk:   After full review of this patient's clinical status, I feel that they are at least moderate risk at this time.  Time:   Today, I have spent 5 minutes with the patient with telehealth technology discussing the above issues.     Medication Adjustments/Labs and Tests Ordered: Current medicines are reviewed at length with the patient today.  Concerns regarding medicines are outlined above.   Tests Ordered: No orders of the defined types were placed in this encounter.   Medication Changes: No orders of the defined types were placed in this encounter.   Disposition:  FU with Dr. Marlou Porch in the office in 3 months with EKG. Consider updating Echo on return.     Patient is agreeable to this plan and will call if any problems develop in the interim.   Amie Critchley, NP  12/17/2020 8:55 AM    [place]

## 2020-12-17 ENCOUNTER — Encounter: Payer: Self-pay | Admitting: Nurse Practitioner

## 2020-12-17 ENCOUNTER — Telehealth: Payer: Self-pay | Admitting: *Deleted

## 2020-12-17 ENCOUNTER — Telehealth (INDEPENDENT_AMBULATORY_CARE_PROVIDER_SITE_OTHER): Payer: Medicare Other | Admitting: Nurse Practitioner

## 2020-12-17 ENCOUNTER — Other Ambulatory Visit: Payer: Self-pay

## 2020-12-17 VITALS — BP 167/69 | HR 67 | Ht 68.0 in | Wt 139.0 lb

## 2020-12-17 DIAGNOSIS — I2510 Atherosclerotic heart disease of native coronary artery without angina pectoris: Secondary | ICD-10-CM

## 2020-12-17 DIAGNOSIS — I350 Nonrheumatic aortic (valve) stenosis: Secondary | ICD-10-CM | POA: Diagnosis not present

## 2020-12-17 DIAGNOSIS — Z79899 Other long term (current) drug therapy: Secondary | ICD-10-CM | POA: Diagnosis not present

## 2020-12-17 DIAGNOSIS — I471 Supraventricular tachycardia: Secondary | ICD-10-CM | POA: Diagnosis not present

## 2020-12-17 NOTE — Patient Instructions (Addendum)
After Visit Summary:  We will be checking the following labs today - None   Medication Instructions:    Continue with your current medicines.    If you need a refill on your cardiac medications before your next appointment, please call your pharmacy.     Testing/Procedures To Be Arranged:  N/A  Follow-Up:   See Dr. Anne Fu in the office with EKG in 3 months please.     At [facility], you and your health needs are our priority.  As part of our continuing mission to provide you with exceptional heart care, we have created designated Provider Care Teams.  These Care Teams include your primary Cardiologist (physician) and Advanced Practice Providers (APPs -  Physician Assistants and Nurse Practitioners) who all work together to provide you with the care you need, when you need it.  Special Instructions:  . Stay safe, wash your hands for at least 20 seconds and wear a mask when needed.  . It was good to talk with you today.    Call the [facility] office at [phone] if you have any questions, problems or concerns.

## 2020-12-17 NOTE — Telephone Encounter (Signed)
  Patient Consent for Virtual Visit         Seth Simpson has provided verbal consent on 12/17/2020 for a virtual visit (video or telephone).   CONSENT FOR VIRTUAL VISIT FOR:  Seth Simpson  By participating in this virtual visit I agree to the following:  I hereby voluntarily request, consent and authorize [facility] and its employed or contracted physicians, physician assistants, nurse practitioners or other licensed health care professionals (the Practitioner), to provide me with telemedicine health care services (the "Services") as deemed necessary by the treating Practitioner. I acknowledge and consent to receive the Services by the Practitioner via telemedicine. I understand that the telemedicine visit will involve communicating with the Practitioner through live audiovisual communication technology and the disclosure of certain medical information by electronic transmission. I acknowledge that I have been given the opportunity to request an in-person assessment or other available alternative prior to the telemedicine visit and am voluntarily participating in the telemedicine visit.  I understand that I have the right to withhold or withdraw my consent to the use of telemedicine in the course of my care at any time, without affecting my right to future care or treatment, and that the Practitioner or I may terminate the telemedicine visit at any time. I understand that I have the right to inspect all information obtained and/or recorded in the course of the telemedicine visit and may receive copies of available information for a reasonable fee.  I understand that some of the potential risks of receiving the Services via telemedicine include:  Marland Kitchen Delay or interruption in medical evaluation due to technological equipment failure or disruption; . Information transmitted may not be sufficient (e.g. poor resolution of images) to allow for appropriate medical decision making by the Practitioner;  and/or  . In rare instances, security protocols could fail, causing a breach of personal health information.  Furthermore, I acknowledge that it is my responsibility to provide information about my medical history, conditions and care that is complete and accurate to the best of my ability. I acknowledge that Practitioner's advice, recommendations, and/or decision may be based on factors not within their control, such as incomplete or inaccurate data provided by me or distortions of diagnostic images or specimens that may result from electronic transmissions. I understand that the practice of medicine is not an exact science and that Practitioner makes no warranties or guarantees regarding treatment outcomes. I acknowledge that a copy of this consent can be made available to me via my patient portal [facility] MyChart), or I can request a printed copy by calling the office of [facility].    I understand that my insurance will be billed for this visit.   I have read or had this consent read to me. . I understand the contents of this consent, which adequately explains the benefits and risks of the Services being provided via telemedicine.  . I have been provided ample opportunity to ask questions regarding this consent and the Services and have had my questions answered to my satisfaction. . I give my informed consent for the services to be provided through the use of telemedicine in my medical care

## 2020-12-30 DIAGNOSIS — I4891 Unspecified atrial fibrillation: Secondary | ICD-10-CM | POA: Diagnosis not present

## 2020-12-30 DIAGNOSIS — E039 Hypothyroidism, unspecified: Secondary | ICD-10-CM | POA: Diagnosis not present

## 2020-12-30 DIAGNOSIS — I2510 Atherosclerotic heart disease of native coronary artery without angina pectoris: Secondary | ICD-10-CM | POA: Diagnosis not present

## 2020-12-30 DIAGNOSIS — I10 Essential (primary) hypertension: Secondary | ICD-10-CM | POA: Diagnosis not present

## 2020-12-30 DIAGNOSIS — M069 Rheumatoid arthritis, unspecified: Secondary | ICD-10-CM | POA: Diagnosis not present

## 2020-12-30 DIAGNOSIS — K219 Gastro-esophageal reflux disease without esophagitis: Secondary | ICD-10-CM | POA: Diagnosis not present

## 2020-12-30 DIAGNOSIS — E1165 Type 2 diabetes mellitus with hyperglycemia: Secondary | ICD-10-CM | POA: Diagnosis not present

## 2020-12-30 DIAGNOSIS — E782 Mixed hyperlipidemia: Secondary | ICD-10-CM | POA: Diagnosis not present

## 2020-12-30 DIAGNOSIS — M179 Osteoarthritis of knee, unspecified: Secondary | ICD-10-CM | POA: Diagnosis not present

## 2021-01-23 DIAGNOSIS — Z8551 Personal history of malignant neoplasm of bladder: Secondary | ICD-10-CM | POA: Diagnosis not present

## 2021-01-26 DIAGNOSIS — Z6821 Body mass index (BMI) 21.0-21.9, adult: Secondary | ICD-10-CM | POA: Diagnosis not present

## 2021-01-26 DIAGNOSIS — M0579 Rheumatoid arthritis with rheumatoid factor of multiple sites without organ or systems involvement: Secondary | ICD-10-CM | POA: Diagnosis not present

## 2021-01-26 DIAGNOSIS — M2550 Pain in unspecified joint: Secondary | ICD-10-CM | POA: Diagnosis not present

## 2021-01-26 DIAGNOSIS — Z79899 Other long term (current) drug therapy: Secondary | ICD-10-CM | POA: Diagnosis not present

## 2021-01-26 DIAGNOSIS — M1009 Idiopathic gout, multiple sites: Secondary | ICD-10-CM | POA: Diagnosis not present

## 2021-02-04 DIAGNOSIS — I4891 Unspecified atrial fibrillation: Secondary | ICD-10-CM | POA: Diagnosis not present

## 2021-02-04 DIAGNOSIS — I10 Essential (primary) hypertension: Secondary | ICD-10-CM | POA: Diagnosis not present

## 2021-02-04 DIAGNOSIS — M179 Osteoarthritis of knee, unspecified: Secondary | ICD-10-CM | POA: Diagnosis not present

## 2021-02-04 DIAGNOSIS — E782 Mixed hyperlipidemia: Secondary | ICD-10-CM | POA: Diagnosis not present

## 2021-02-04 DIAGNOSIS — I2510 Atherosclerotic heart disease of native coronary artery without angina pectoris: Secondary | ICD-10-CM | POA: Diagnosis not present

## 2021-02-04 DIAGNOSIS — K219 Gastro-esophageal reflux disease without esophagitis: Secondary | ICD-10-CM | POA: Diagnosis not present

## 2021-02-04 DIAGNOSIS — G459 Transient cerebral ischemic attack, unspecified: Secondary | ICD-10-CM | POA: Diagnosis not present

## 2021-02-04 DIAGNOSIS — E119 Type 2 diabetes mellitus without complications: Secondary | ICD-10-CM | POA: Diagnosis not present

## 2021-02-04 DIAGNOSIS — M069 Rheumatoid arthritis, unspecified: Secondary | ICD-10-CM | POA: Diagnosis not present

## 2021-02-16 DIAGNOSIS — M069 Rheumatoid arthritis, unspecified: Secondary | ICD-10-CM | POA: Diagnosis not present

## 2021-02-16 DIAGNOSIS — E1165 Type 2 diabetes mellitus with hyperglycemia: Secondary | ICD-10-CM | POA: Diagnosis not present

## 2021-02-16 DIAGNOSIS — E1140 Type 2 diabetes mellitus with diabetic neuropathy, unspecified: Secondary | ICD-10-CM | POA: Diagnosis not present

## 2021-02-16 DIAGNOSIS — D509 Iron deficiency anemia, unspecified: Secondary | ICD-10-CM | POA: Diagnosis not present

## 2021-02-16 DIAGNOSIS — G459 Transient cerebral ischemic attack, unspecified: Secondary | ICD-10-CM | POA: Diagnosis not present

## 2021-02-16 DIAGNOSIS — I2510 Atherosclerotic heart disease of native coronary artery without angina pectoris: Secondary | ICD-10-CM | POA: Diagnosis not present

## 2021-02-16 DIAGNOSIS — I4891 Unspecified atrial fibrillation: Secondary | ICD-10-CM | POA: Diagnosis not present

## 2021-02-16 DIAGNOSIS — I10 Essential (primary) hypertension: Secondary | ICD-10-CM | POA: Diagnosis not present

## 2021-02-16 DIAGNOSIS — E782 Mixed hyperlipidemia: Secondary | ICD-10-CM | POA: Diagnosis not present

## 2021-02-16 DIAGNOSIS — E039 Hypothyroidism, unspecified: Secondary | ICD-10-CM | POA: Diagnosis not present

## 2021-03-05 DIAGNOSIS — H903 Sensorineural hearing loss, bilateral: Secondary | ICD-10-CM | POA: Diagnosis not present

## 2021-03-09 DIAGNOSIS — E119 Type 2 diabetes mellitus without complications: Secondary | ICD-10-CM | POA: Diagnosis not present

## 2021-03-09 DIAGNOSIS — Z961 Presence of intraocular lens: Secondary | ICD-10-CM | POA: Diagnosis not present

## 2021-03-18 ENCOUNTER — Ambulatory Visit: Payer: Medicare Other | Admitting: Cardiology

## 2021-03-18 ENCOUNTER — Encounter: Payer: Self-pay | Admitting: Cardiology

## 2021-03-18 ENCOUNTER — Other Ambulatory Visit: Payer: Self-pay

## 2021-03-18 VITALS — BP 100/60 | HR 63 | Ht 68.0 in | Wt 145.0 lb

## 2021-03-18 DIAGNOSIS — I2510 Atherosclerotic heart disease of native coronary artery without angina pectoris: Secondary | ICD-10-CM

## 2021-03-18 DIAGNOSIS — I471 Supraventricular tachycardia: Secondary | ICD-10-CM | POA: Diagnosis not present

## 2021-03-18 DIAGNOSIS — I350 Nonrheumatic aortic (valve) stenosis: Secondary | ICD-10-CM | POA: Diagnosis not present

## 2021-03-18 DIAGNOSIS — Z79899 Other long term (current) drug therapy: Secondary | ICD-10-CM | POA: Diagnosis not present

## 2021-03-18 MED ORDER — AMIODARONE HCL 200 MG PO TABS: 100.0000 mg | ORAL_TABLET | Freq: Every day | ORAL | 3 refills | Status: AC

## 2021-03-18 NOTE — Patient Instructions (Signed)
Medication Instructions:  Please decrease your Amiodarone to 100 mg a day. Continue all other medications as listed.  *If you need a refill on your cardiac medications before your next appointment, please call your pharmacy*  Testing/Procedures: Your physician has requested that you have an echocardiogram. Echocardiography is a painless test that uses sound waves to create images of your heart. It provides your doctor with information about the size and shape of your heart and how well your heart's chambers and valves are working. This procedure takes approximately one hour. There are no restrictions for this procedure.  Follow-Up: At [facility], you and your health needs are our priority.  As part of our continuing mission to provide you with exceptional heart care, we have created designated Provider Care Teams.  These Care Teams include your primary Cardiologist (physician) and Advanced Practice Providers (APPs -  Physician Assistants and Nurse Practitioners) who all work together to provide you with the care you need, when you need it.  We recommend signing up for the patient portal called "MyChart".  Sign up information is provided on this After Visit Summary.  MyChart is used to connect with patients for Virtual Visits (Telemedicine).  Patients are able to view lab/test results, encounter notes, upcoming appointments, etc.  Non-urgent messages can be sent to your provider as well.   To learn more about what you can do with MyChart, go to [email or web address].    Your next appointment:   6 month(s)  The format for your next appointment:   In Person  Provider:   Candee Furbish, MD   Thank you for choosing [facility]!!

## 2021-03-18 NOTE — Progress Notes (Signed)
Cardiology Office Note:    Date:  03/18/2021   ID:  Seth Simpson, DOB 1931-03-27, MRN [id number]  PCP:  Josetta Huddle, MD   [place]  Cardiologist:  Candee Furbish, MD  Advanced Practice Provider:  No care team member to display Electrophysiologist:  None       Referring MD: Josetta Huddle, MD     History of Present Illness:    Seth Simpson is a 87 y.o. male here for the follow-up of coronary artery disease post CABG 2009 prior inferior myocardial infarction.  Has mild aortic stenosis left anterior fascicular block hyperlipidemia.  PSVT was noted in 2015.  It was terminated with carotid massage.  Adenosine and diltiazem has helped in the past.  He saw Dr. Rayann Heman and Caryl Comes for this.  He was placed on amiodarone which seems to have helped.  He would rather medication approach.  He has had some trouble with statin intolerance in the past.  He is to be neighbors with P.J. Hines, RN who worked at [employer].   He has been doing fairly well.  Walking with a cane.  His gait has slowed.  He has had a few falls.  Past Medical History:  Diagnosis Date  . Bicuspid aortic valve   . Bladder tumor   . BPH (benign prostatic hypertrophy)   . Coronary artery disease cardiologist-  dr Marlou Porch   a. s/p CABG  x5  03/2008 - presentation acute STEMI  . Depression   . Erectile dysfunction   . GERD (gastroesophageal reflux disease)   . Gout    per pt 07-14-2017 stable  . Heart murmur   . Hiatal hernia   . History of bladder stone   . History of kidney stones   . History of prostatitis    and hx orchitis  . History of ST elevation myocardial infarction (STEMI)    04-10-2008  acute inferior  . History of syncope    2011 sinoatrial node dysfunction  and 11-20-2014 presyncope w/ wide complex tachycardia resolved  w/ valvasa and adenosine  . Hyperlipidemia   . Hypertension   . Iron deficiency anemia   . LAFB (left anterior fascicular block)   . Lower urinary tract symptoms  (LUTS)   . Mild aortic stenosis    echo 04-23-2014  Valve Area 1.69 cm^2;  last echo 12-09-2016 valve area 1.47cm^2  . OA (osteoarthritis)   . PSVT (paroxysmal supraventricular tachycardia) ([place])   . PVC's (premature ventricular contractions)   . Rheumatoid arthritis ([place]) 10/02/2013   Dr. Lenna Gilford, methotrexate  . S/P CABG x 5    04-11-2008  . Sigmoid diverticulosis   . Type II diabetes mellitus ([place])   . Wears glasses     Past Surgical History:  Procedure Laterality Date  . APPENDECTOMY  1954  . BALLOON DILATION  04/06/2012   Procedure: BALLOON DILATION;  Surgeon: Garlan Fair, MD;  Location: [facility];  Service: Endoscopy;  Laterality: N/A;  . CARDIAC CATHETERIZATION  04-10-2008  dr Daneen Schick   3 vessel CAD  . CATARACT EXTRACTION W/ INTRAOCULAR LENS  IMPLANT, BILATERAL Bilateral 2003  . CHOLECYSTECTOMY OPEN  1978  . COLONOSCOPY  04/06/2012   Procedure: COLONOSCOPY;  Surgeon: Garlan Fair, MD;  Location: [facility];  Service: Endoscopy;  Laterality: N/A;  . CORONARY ARTERY BYPASS GRAFT  04-11-2008   LIMA to RI,  SVG to D1,  SVG to LAD,  sequetial SVG to Acute Marginal and pRCA  .  CYSTOSCOPY W/ RETROGRADES Bilateral 07/26/2017   Procedure: CYSTOSCOPY WITH BILATERAL  RETROGRADE PYELOGRAM;  Surgeon: Irine Seal, MD;  Location: [facility];  Service: Urology;  Laterality: Bilateral;  . CYSTOSCOPY WITH BIOPSY N/A 06/17/2016   Procedure: CYSTOSCOPY WITH BIOPSY;  Surgeon: Irine Seal, MD;  Location: [facility];  Service: Urology;  Laterality: N/A;  . CYSTOSCOPY WITH BIOPSY N/A 07/26/2017   Procedure: urethral dilation CYSTOSCOPY WITH BIOPSY AND FULGURATION, removal of bladder stone;  Surgeon: Irine Seal, MD;  Location: [facility];  Service: Urology;  Laterality: N/A;  . CYSTOSCOPY WITH LITHOLAPAXY N/A 06/17/2016   Procedure: CYSTOSCOPY WITH LITHOLAPAXY;  Surgeon: Irine Seal, MD;  Location: [facility];  Service: Urology;   Laterality: N/A;  . CYSTOSCOPY WITH RETROGRADE PYELOGRAM, URETEROSCOPY AND STENT PLACEMENT  06-17-2016  dr Irine Seal   Cysto/  Bilateral RTG's/  Bladder bx and fulgeration bladder tumor's left & right lateral wall's/  Litholapaxy bladder stone/  left ureteroscopy/  Insertion bilateral double J stents  . CYSTOSCOPY WITH RETROGRADE PYELOGRAM, URETEROSCOPY AND STENT PLACEMENT Bilateral 06/17/2016   Procedure: CYSTOSCOPY BILATERAL RETROGRADE,  LEFT URETEROSCOPY AND BILATERAL STENT PLACEMENT;  Surgeon: Irine Seal, MD;  Location: [facility];  Service: Urology;  Laterality: Bilateral;  . CYSTOSCOPY WITH URETEROSCOPY AND STENT PLACEMENT Bilateral 07/01/2016   Procedure: BILATERAL URETEROSCOPY STONE EXTRACTIONS WITH POSSIBLE STENT PLACEMENT;  Surgeon: Irine Seal, MD;  Location: [facility];  Service: Urology;  Laterality: Bilateral;  . ESOPHAGOGASTRODUODENOSCOPY  04/06/2012   Procedure: ESOPHAGOGASTRODUODENOSCOPY (EGD);  Surgeon: Garlan Fair, MD;  Location: [facility];  Service: Endoscopy;  Laterality: N/A;  . FINGER SURGERY Left 2000's   "removed BB", 2nd digit  . HOLMIUM LASER APPLICATION N/A 05/19/2093   Procedure: HOLMIUM LASER APPLICATION;  Surgeon: Irine Seal, MD;  Location: [facility];  Service: Urology;  Laterality: N/A;  . HOLMIUM LASER APPLICATION Bilateral 06/20/6282   Procedure: HOLMIUM LASER APPLICATION;  Surgeon: Irine Seal, MD;  Location: [facility];  Service: Urology;  Laterality: Bilateral;  . TOTAL KNEE ARTHROPLASTY Bilateral right 03-20-2008//  left 08-23-2007  . TRANSTHORACIC ECHOCARDIOGRAM  12-09-2016 dr Marlou Porch   mild LVH,  ef 50-55%/  Bicuspid AV with stenosis, valve area 1.47 cm^2 (previous echo it was 1.67cm^2 on 04-23-2014), trivial regurg/  mild LAE/ mild MR/  moderate TR, PASP 83mmHg/     Current Medications: Current Meds  Medication Sig  . ACCU-CHEK AVIVA PLUS test strip   . ACCU-CHEK SOFTCLIX LANCETS lancets  USE TO TEST BLOOD SUGAR DAILY  . acetaminophen (TYLENOL) 500 MG tablet Take 500 mg by mouth every 6 (six) hours as needed for mild pain, moderate pain, fever or headache.  . allopurinol (ZYLOPRIM) 300 MG tablet Take 300 mg by mouth every evening.   Marland Kitchen amLODipine-benazepril (LOTREL) 5-20 MG per capsule Take 1 capsule by mouth daily.  . Cholecalciferol (VITAMIN D3) 2000 units TABS Take 1 tablet by mouth every evening.  . colchicine 0.6 MG tablet Take 0.6 mg by mouth daily as needed (gout).   Marland Kitchen diphenhydrAMINE (BENADRYL) 25 MG tablet Take 25 mg by mouth daily as needed for allergies. Reported on 06/19/2016  . ferrous sulfate 325 (65 FE) MG tablet Take 650 mg by mouth every evening.   . finasteride (PROSCAR) 5 MG tablet Take 5 mg by mouth daily.  . Flaxseed, Linseed, (FLAXSEED OIL MAX STR) 1300 MG CAPS Take 1 capsule by mouth every evening.   . folic acid (  FOLVITE) 1 MG tablet Take 2 mg by mouth every morning.   . hydrALAZINE (APRESOLINE) 25 MG tablet Take 25 mg by mouth 2 (two) times daily.  Marland Kitchen levothyroxine (SYNTHROID) 50 MCG tablet Take 50 mcg by mouth daily.  Marland Kitchen loratadine (CLARITIN) 10 MG tablet Take 10 mg by mouth every morning.   . metFORMIN (GLUCOPHAGE) 500 MG tablet Take 500 mg by mouth 2 (two) times daily with a meal.   . methotrexate (RHEUMATREX) 2.5 MG tablet Take 10 mg by mouth every Friday. Take on Fridays.  Caution:Chemotherapy. Protect from light.  . metoprolol succinate (TOPROL-XL) 25 MG 24 hr tablet Take 1 tablet (25 mg total) by mouth daily.  . Multiple Vitamin (MULTIVITAMIN WITH MINERALS) TABS tablet Take 1 tablet by mouth every evening.   . naproxen (NAPRELAN) 500 MG 24 hr tablet Take 500 mg by mouth daily as needed (Pain).   Marland Kitchen omeprazole (PRILOSEC OTC) 20 MG tablet Take 10 mg by mouth as needed.   . rosuvastatin (CRESTOR) 10 MG tablet Take 10 mg by mouth every Monday, Wednesday, and Friday.   . sertraline (ZOLOFT) 100 MG tablet Take 50 mg by mouth every morning.   . sildenafil  (VIAGRA) 100 MG tablet Take 100 mg by mouth daily as needed for erectile dysfunction.  . tamsulosin (FLOMAX) 0.4 MG CAPS capsule Take 0.4 mg by mouth daily.  Marland Kitchen terazosin (HYTRIN) 5 MG capsule Take 5 mg by mouth at bedtime.   . triamcinolone (NASACORT) 55 MCG/ACT AERO nasal inhaler Place 2 sprays into the nose daily as needed (Nasal congestion).  . [DISCONTINUED] amiodarone (PACERONE) 200 MG tablet TAKE 1 TABLET EVERY DAY     Allergies:   Peanut allergen powder-dnfp, Peanut-containing drug products, Hydrochlorothiazide, Other, Pistachio nut (diagnostic), Codeine, and Fruit & vegetable daily [nutritional supplements]   Social History   Socioeconomic History  . Marital status: Married    Spouse name: Not on file  . Number of children: Not on file  . Years of education: Not on file  . Highest education level: Not on file  Occupational History  . Not on file  Tobacco Use  . Smoking status: Former Smoker    Packs/day: 1.00    Years: 15.00    Pack years: 15.00    Types: Cigarettes    Quit date: 09/29/1964    Years since quitting: 56.5  . Smokeless tobacco: Never Used  Substance and Sexual Activity  . Alcohol use: Yes    Alcohol/week: 2.0 standard drinks    Types: 2 Glasses of wine per week  . Drug use: No  . Sexual activity: Not on file  Other Topics Concern  . Not on file  Social History Narrative  . Not on file   Social Determinants of Health   Financial Resource Strain: Not on file  Food Insecurity: Not on file  Transportation Needs: Not on file  Physical Activity: Not on file  Stress: Not on file  Social Connections: Not on file     Family History: The patient's family history includes Heart disease in his father.  ROS:   Please see the history of present illness.     All other systems reviewed and are negative.  EKGs/Labs/Other Studies Reviewed:    The following studies were reviewed today:    LHC (4/09):LAD 50-70%, ostial/proximal D1 70-75%, proximal RI  95%, CFX subtotally occluded, RCA 95% >>CABG - Echo (5/15):Mild LVH, EF 60-65%, normal wall motion, mild AS (mean 12 mmHg) , mild MR  ECHO  12/01/18  - Left ventricle: The cavity size was normal. Wall thickness was increased in a pattern of mild LVH. Systolic function was normal. The estimated ejection fraction was in the range of 55% to 60%. Wall motion was normal; there were no regional wall motion abnormalities. Doppler parameters are consistent with abnormal left ventricular relaxation (grade 1 diastolic dysfunction). - Ventricular septum: Abnormal septal motion, likely secondary to post-operative state and conduction delay. - Aortic valve: There was mild stenosis. There was trivial regurgitation. Mean gradient (S): 16 mm Hg. - Mitral valve: Mildly thickened leaflets . There was mild regurgitation. - Left atrium: The atrium was normal in size. - Right ventricle: The cavity size was normal. Wall thickness was normal. Systolic function was mildly reduced. RV systolic pressure (S, est): 38 mm Hg. - Right atrium: The atrium was normal in size. Central venous pressure (est): 3 mm Hg. - Tricuspid valve: There was mild regurgitation. - Pulmonary arteries: Systolic pressure was mildly to moderately increased. - Pericardium, extracardiac: There was no pericardial effusion  EKG:  EKG is  ordered today.  The ekg ordered today demonstrates SR 63 LBBB  Recent Labs: No results found for requested labs within last 8760 hours.  Recent Lipid Panel    Component Value Date/Time   CHOL 84 11/21/2014 0621   TRIG 82 11/21/2014 0621   HDL 24 (L) 11/21/2014 0621   CHOLHDL 3.5 11/21/2014 0621   VLDL 16 11/21/2014 0621   LDLCALC 44 11/21/2014 0621     Risk Assessment/Calculations:      Physical Exam:    VS:  BP 100/60 (BP Location: Left Arm, Patient Position: Sitting, Cuff Size: Normal)   Pulse 63   Ht 5\' 8"  (1.727 m)   Wt 145 lb (65.8 kg)   SpO2 96%    BMI 22.05 kg/m     Wt Readings from Last 3 Encounters:  03/18/21 145 lb (65.8 kg)  12/17/20 139 lb (63 kg)  12/03/19 144 lb 6.4 oz (65.5 kg)     GEN:  Well nourished, well developed in no acute distress HEENT: Normal NECK: No JVD; No carotid bruits LYMPHATICS: No lymphadenopathy CARDIAC: RRR, 3/6 SM, rubs, gallops RESPIRATORY:  Clear to auscultation without rales, wheezing or rhonchi  ABDOMEN: Soft, non-tender, non-distended MUSCULOSKELETAL:  No edema; No deformity  SKIN: Warm and dry NEUROLOGIC:  Alert and oriented x 3 PSYCHIATRIC:  Normal affect   ASSESSMENT:    1. SVT (supraventricular tachycardia) ([place])   2. Coronary artery disease involving native coronary artery of native heart without angina pectoris   3. Long term current use of amiodarone   4. High risk medication use   5. Mild aortic stenosis    PLAN:    In order of problems listed above:    Coronary artery disease status post CABG 2009 -Once again, doing very well without any anginal symptoms, medications reviewed.  Paroxysmal supraventricular tachycardia - Amiodarone.  Started in 2015.  Saw Dr. Caryl Comes and Dr. Rayann Heman.  Opted for noninvasive management for SVT.  TSH normal, potassium was normal.  Had previously stopped metopolol. Been off for a while.   Amiodarone use -Since he has been very stable, I will decrease his amiodarone to 100 mg a day. -TSH has been normal, liver enzymes have been normal.  Chest x-ray in 2019 was normal.  Continue.  From outside labs TSH 0.19 hemoglobin 13.0 creatinine 1.5 potassium 4.8 ALT 15 LDL 68  LBBB - No syncope.  Watch with amiodarone.  Stable.  If  conduction were to deteriorate further, pacemaker may be warranted.  No change today.  Sinoatrial node dysfunction - Prior bradycardia.    No significant bradycardia noted.  No falls no syncope  Frequent PVCs -Prior bigeminy pattern.  Amiodarone has helped out significantly.    We will decrease his amiodarone to 100 mg a  day.  Overall doing well.  Mild aortic stenosis, possible bicuspid valve -Prominent murmur on exam, since it has been 3 years we will check an echocardiogram.  Previously mild.        Medication Adjustments/Labs and Tests Ordered: Current medicines are reviewed at length with the patient today.  Concerns regarding medicines are outlined above.  Orders Placed This Encounter  Procedures  . EKG 12-Lead  . ECHOCARDIOGRAM COMPLETE   Meds ordered this encounter  Medications  . amiodarone (PACERONE) 200 MG tablet    Sig: Take 0.5 tablets (100 mg total) by mouth daily.    Dispense:  45 tablet    Refill:  3    Patient Instructions  Medication Instructions:  Please decrease your Amiodarone to 100 mg a day. Continue all other medications as listed.  *If you need a refill on your cardiac medications before your next appointment, please call your pharmacy*  Testing/Procedures: Your physician has requested that you have an echocardiogram. Echocardiography is a painless test that uses sound waves to create images of your heart. It provides your doctor with information about the size and shape of your heart and how well your heart's chambers and valves are working. This procedure takes approximately one hour. There are no restrictions for this procedure.  Follow-Up: At [facility], you and your health needs are our priority.  As part of our continuing mission to provide you with exceptional heart care, we have created designated Provider Care Teams.  These Care Teams include your primary Cardiologist (physician) and Advanced Practice Providers (APPs -  Physician Assistants and Nurse Practitioners) who all work together to provide you with the care you need, when you need it.  We recommend signing up for the patient portal called "MyChart".  Sign up information is provided on this After Visit Summary.  MyChart is used to connect with patients for Virtual Visits (Telemedicine).  Patients are  able to view lab/test results, encounter notes, upcoming appointments, etc.  Non-urgent messages can be sent to your provider as well.   To learn more about what you can do with MyChart, go to [email or web address].    Your next appointment:   6 month(s)  The format for your next appointment:   In Person  Provider:   Candee Furbish, MD   Thank you for choosing [facility]!!        Signed, Candee Furbish, MD  03/18/2021 4:14 PM    [place]

## 2021-04-06 DIAGNOSIS — E119 Type 2 diabetes mellitus without complications: Secondary | ICD-10-CM | POA: Diagnosis not present

## 2021-04-06 DIAGNOSIS — Z961 Presence of intraocular lens: Secondary | ICD-10-CM | POA: Diagnosis not present

## 2021-04-08 DIAGNOSIS — Z7984 Long term (current) use of oral hypoglycemic drugs: Secondary | ICD-10-CM | POA: Diagnosis not present

## 2021-04-08 DIAGNOSIS — E1165 Type 2 diabetes mellitus with hyperglycemia: Secondary | ICD-10-CM | POA: Diagnosis not present

## 2021-04-08 DIAGNOSIS — E1140 Type 2 diabetes mellitus with diabetic neuropathy, unspecified: Secondary | ICD-10-CM | POA: Diagnosis not present

## 2021-04-08 DIAGNOSIS — M069 Rheumatoid arthritis, unspecified: Secondary | ICD-10-CM | POA: Diagnosis not present

## 2021-04-08 DIAGNOSIS — M179 Osteoarthritis of knee, unspecified: Secondary | ICD-10-CM | POA: Diagnosis not present

## 2021-04-08 DIAGNOSIS — D509 Iron deficiency anemia, unspecified: Secondary | ICD-10-CM | POA: Diagnosis not present

## 2021-04-08 DIAGNOSIS — L723 Sebaceous cyst: Secondary | ICD-10-CM | POA: Diagnosis not present

## 2021-04-08 DIAGNOSIS — I2510 Atherosclerotic heart disease of native coronary artery without angina pectoris: Secondary | ICD-10-CM | POA: Diagnosis not present

## 2021-04-23 ENCOUNTER — Other Ambulatory Visit: Payer: Self-pay

## 2021-04-23 ENCOUNTER — Ambulatory Visit (HOSPITAL_COMMUNITY): Payer: Medicare Other | Attending: Cardiology

## 2021-04-23 DIAGNOSIS — I350 Nonrheumatic aortic (valve) stenosis: Secondary | ICD-10-CM | POA: Diagnosis not present

## 2021-04-23 LAB — ECHOCARDIOGRAM COMPLETE
AR max vel: 1.58 cm2
AV Area VTI: 1.73 cm2
AV Area mean vel: 1.67 cm2
AV Mean grad: 11.5 mmHg
AV Peak grad: 21.6 mmHg
Ao pk vel: 2.33 m/s
Area-P 1/2: 2.1 cm2
P 1/2 time: 564 msec
S' Lateral: 2.4 cm

## 2021-04-27 ENCOUNTER — Telehealth: Payer: Self-pay

## 2021-04-27 DIAGNOSIS — I350 Nonrheumatic aortic (valve) stenosis: Secondary | ICD-10-CM

## 2021-04-27 NOTE — Telephone Encounter (Signed)
-----   Message from Jerline Pain, MD sent at 04/24/2021  3:48 PM EDT ----- Mild to moderate aortic valve stenosis.  Normal pump function.  Repeat echocardiogram in 1 year to monitor aortic valve disease.  Candee Furbish, MD

## 2021-04-27 NOTE — Telephone Encounter (Signed)
Reviewed results with patient who verbalized understanding.   Repeat echo ordered to be scheduled in one year.  The patient is grateful for call and agrees with plan.

## 2021-05-08 DIAGNOSIS — I10 Essential (primary) hypertension: Secondary | ICD-10-CM | POA: Diagnosis not present

## 2021-05-08 DIAGNOSIS — I4891 Unspecified atrial fibrillation: Secondary | ICD-10-CM | POA: Diagnosis not present

## 2021-05-08 DIAGNOSIS — E1165 Type 2 diabetes mellitus with hyperglycemia: Secondary | ICD-10-CM | POA: Diagnosis not present

## 2021-05-08 DIAGNOSIS — E119 Type 2 diabetes mellitus without complications: Secondary | ICD-10-CM | POA: Diagnosis not present

## 2021-05-08 DIAGNOSIS — E1140 Type 2 diabetes mellitus with diabetic neuropathy, unspecified: Secondary | ICD-10-CM | POA: Diagnosis not present

## 2021-05-08 DIAGNOSIS — E782 Mixed hyperlipidemia: Secondary | ICD-10-CM | POA: Diagnosis not present

## 2021-05-08 DIAGNOSIS — E039 Hypothyroidism, unspecified: Secondary | ICD-10-CM | POA: Diagnosis not present

## 2021-05-08 DIAGNOSIS — G459 Transient cerebral ischemic attack, unspecified: Secondary | ICD-10-CM | POA: Diagnosis not present

## 2021-06-04 DIAGNOSIS — E1140 Type 2 diabetes mellitus with diabetic neuropathy, unspecified: Secondary | ICD-10-CM | POA: Diagnosis not present

## 2021-06-04 DIAGNOSIS — E039 Hypothyroidism, unspecified: Secondary | ICD-10-CM | POA: Diagnosis not present

## 2021-06-04 DIAGNOSIS — I2510 Atherosclerotic heart disease of native coronary artery without angina pectoris: Secondary | ICD-10-CM | POA: Diagnosis not present

## 2021-06-04 DIAGNOSIS — G459 Transient cerebral ischemic attack, unspecified: Secondary | ICD-10-CM | POA: Diagnosis not present

## 2021-06-04 DIAGNOSIS — I4891 Unspecified atrial fibrillation: Secondary | ICD-10-CM | POA: Diagnosis not present

## 2021-06-04 DIAGNOSIS — K219 Gastro-esophageal reflux disease without esophagitis: Secondary | ICD-10-CM | POA: Diagnosis not present

## 2021-06-04 DIAGNOSIS — M069 Rheumatoid arthritis, unspecified: Secondary | ICD-10-CM | POA: Diagnosis not present

## 2021-06-04 DIAGNOSIS — E782 Mixed hyperlipidemia: Secondary | ICD-10-CM | POA: Diagnosis not present

## 2021-06-04 DIAGNOSIS — I10 Essential (primary) hypertension: Secondary | ICD-10-CM | POA: Diagnosis not present

## 2021-06-04 DIAGNOSIS — E119 Type 2 diabetes mellitus without complications: Secondary | ICD-10-CM | POA: Diagnosis not present

## 2021-06-10 DIAGNOSIS — W010XXA Fall on same level from slipping, tripping and stumbling without subsequent striking against object, initial encounter: Secondary | ICD-10-CM | POA: Diagnosis not present

## 2021-06-10 DIAGNOSIS — S3992XA Unspecified injury of lower back, initial encounter: Secondary | ICD-10-CM | POA: Diagnosis not present

## 2021-07-22 DIAGNOSIS — Z8551 Personal history of malignant neoplasm of bladder: Secondary | ICD-10-CM | POA: Diagnosis not present

## 2021-08-12 DIAGNOSIS — E039 Hypothyroidism, unspecified: Secondary | ICD-10-CM | POA: Diagnosis not present

## 2021-08-12 DIAGNOSIS — I4891 Unspecified atrial fibrillation: Secondary | ICD-10-CM | POA: Diagnosis not present

## 2021-08-12 DIAGNOSIS — M069 Rheumatoid arthritis, unspecified: Secondary | ICD-10-CM | POA: Diagnosis not present

## 2021-08-12 DIAGNOSIS — E1140 Type 2 diabetes mellitus with diabetic neuropathy, unspecified: Secondary | ICD-10-CM | POA: Diagnosis not present

## 2021-08-12 DIAGNOSIS — I10 Essential (primary) hypertension: Secondary | ICD-10-CM | POA: Diagnosis not present

## 2021-08-12 DIAGNOSIS — G459 Transient cerebral ischemic attack, unspecified: Secondary | ICD-10-CM | POA: Diagnosis not present

## 2021-08-12 DIAGNOSIS — I2510 Atherosclerotic heart disease of native coronary artery without angina pectoris: Secondary | ICD-10-CM | POA: Diagnosis not present

## 2021-08-12 DIAGNOSIS — E1165 Type 2 diabetes mellitus with hyperglycemia: Secondary | ICD-10-CM | POA: Diagnosis not present

## 2021-08-12 DIAGNOSIS — K219 Gastro-esophageal reflux disease without esophagitis: Secondary | ICD-10-CM | POA: Diagnosis not present

## 2021-08-12 DIAGNOSIS — E782 Mixed hyperlipidemia: Secondary | ICD-10-CM | POA: Diagnosis not present

## 2021-08-13 ENCOUNTER — Emergency Department (HOSPITAL_COMMUNITY): Payer: Medicare Other

## 2021-08-13 ENCOUNTER — Inpatient Hospital Stay (HOSPITAL_COMMUNITY)
Admission: EM | Admit: 2021-08-13 | Discharge: 2021-08-30 | DRG: 166 | Disposition: A | Discharge status: Hospice | Payer: Medicare Other | Attending: Family Medicine | Admitting: Family Medicine

## 2021-08-13 ENCOUNTER — Encounter (HOSPITAL_COMMUNITY): Payer: Self-pay | Admitting: Emergency Medicine

## 2021-08-13 DIAGNOSIS — R918 Other nonspecific abnormal finding of lung field: Secondary | ICD-10-CM | POA: Diagnosis present

## 2021-08-13 DIAGNOSIS — Z96653 Presence of artificial knee joint, bilateral: Secondary | ICD-10-CM | POA: Diagnosis present

## 2021-08-13 DIAGNOSIS — N179 Acute kidney failure, unspecified: Secondary | ICD-10-CM | POA: Diagnosis not present

## 2021-08-13 DIAGNOSIS — E878 Other disorders of electrolyte and fluid balance, not elsewhere classified: Secondary | ICD-10-CM | POA: Diagnosis present

## 2021-08-13 DIAGNOSIS — M069 Rheumatoid arthritis, unspecified: Secondary | ICD-10-CM | POA: Diagnosis not present

## 2021-08-13 DIAGNOSIS — Z961 Presence of intraocular lens: Secondary | ICD-10-CM | POA: Diagnosis present

## 2021-08-13 DIAGNOSIS — Z885 Allergy status to narcotic agent status: Secondary | ICD-10-CM

## 2021-08-13 DIAGNOSIS — M1990 Unspecified osteoarthritis, unspecified site: Secondary | ICD-10-CM | POA: Diagnosis present

## 2021-08-13 DIAGNOSIS — R590 Localized enlarged lymph nodes: Secondary | ICD-10-CM | POA: Diagnosis not present

## 2021-08-13 DIAGNOSIS — J9601 Acute respiratory failure with hypoxia: Secondary | ICD-10-CM | POA: Diagnosis not present

## 2021-08-13 DIAGNOSIS — E872 Acidosis: Secondary | ICD-10-CM | POA: Diagnosis present

## 2021-08-13 DIAGNOSIS — C8592 Non-Hodgkin lymphoma, unspecified, intrathoracic lymph nodes: Secondary | ICD-10-CM | POA: Diagnosis not present

## 2021-08-13 DIAGNOSIS — Z91010 Allergy to peanuts: Secondary | ICD-10-CM

## 2021-08-13 DIAGNOSIS — M109 Gout, unspecified: Secondary | ICD-10-CM | POA: Diagnosis present

## 2021-08-13 DIAGNOSIS — I444 Left anterior fascicular block: Secondary | ICD-10-CM | POA: Diagnosis present

## 2021-08-13 DIAGNOSIS — Z7984 Long term (current) use of oral hypoglycemic drugs: Secondary | ICD-10-CM

## 2021-08-13 DIAGNOSIS — R1310 Dysphagia, unspecified: Secondary | ICD-10-CM | POA: Diagnosis not present

## 2021-08-13 DIAGNOSIS — F32A Depression, unspecified: Secondary | ICD-10-CM | POA: Diagnosis present

## 2021-08-13 DIAGNOSIS — I471 Supraventricular tachycardia: Secondary | ICD-10-CM | POA: Diagnosis not present

## 2021-08-13 DIAGNOSIS — I2510 Atherosclerotic heart disease of native coronary artery without angina pectoris: Secondary | ICD-10-CM | POA: Diagnosis not present

## 2021-08-13 DIAGNOSIS — Z79890 Hormone replacement therapy (postmenopausal): Secondary | ICD-10-CM

## 2021-08-13 DIAGNOSIS — Z9889 Other specified postprocedural states: Secondary | ICD-10-CM

## 2021-08-13 DIAGNOSIS — R0902 Hypoxemia: Secondary | ICD-10-CM

## 2021-08-13 DIAGNOSIS — E039 Hypothyroidism, unspecified: Secondary | ICD-10-CM | POA: Diagnosis present

## 2021-08-13 DIAGNOSIS — J439 Emphysema, unspecified: Secondary | ICD-10-CM | POA: Diagnosis present

## 2021-08-13 DIAGNOSIS — I509 Heart failure, unspecified: Secondary | ICD-10-CM | POA: Diagnosis not present

## 2021-08-13 DIAGNOSIS — K5730 Diverticulosis of large intestine without perforation or abscess without bleeding: Secondary | ICD-10-CM | POA: Diagnosis present

## 2021-08-13 DIAGNOSIS — Z91018 Allergy to other foods: Secondary | ICD-10-CM

## 2021-08-13 DIAGNOSIS — Z79899 Other long term (current) drug therapy: Secondary | ICD-10-CM

## 2021-08-13 DIAGNOSIS — Z87442 Personal history of urinary calculi: Secondary | ICD-10-CM

## 2021-08-13 DIAGNOSIS — K449 Diaphragmatic hernia without obstruction or gangrene: Secondary | ICD-10-CM | POA: Diagnosis not present

## 2021-08-13 DIAGNOSIS — N3289 Other specified disorders of bladder: Secondary | ICD-10-CM | POA: Diagnosis not present

## 2021-08-13 DIAGNOSIS — E7800 Pure hypercholesterolemia, unspecified: Secondary | ICD-10-CM | POA: Diagnosis not present

## 2021-08-13 DIAGNOSIS — Z743 Need for continuous supervision: Secondary | ICD-10-CM | POA: Diagnosis not present

## 2021-08-13 DIAGNOSIS — Z20822 Contact with and (suspected) exposure to covid-19: Secondary | ICD-10-CM | POA: Diagnosis not present

## 2021-08-13 DIAGNOSIS — F419 Anxiety disorder, unspecified: Secondary | ICD-10-CM | POA: Diagnosis present

## 2021-08-13 DIAGNOSIS — J9811 Atelectasis: Secondary | ICD-10-CM | POA: Diagnosis present

## 2021-08-13 DIAGNOSIS — I10 Essential (primary) hypertension: Secondary | ICD-10-CM | POA: Diagnosis not present

## 2021-08-13 DIAGNOSIS — D72829 Elevated white blood cell count, unspecified: Secondary | ICD-10-CM | POA: Diagnosis present

## 2021-08-13 DIAGNOSIS — H9190 Unspecified hearing loss, unspecified ear: Secondary | ICD-10-CM | POA: Diagnosis present

## 2021-08-13 DIAGNOSIS — Z9842 Cataract extraction status, left eye: Secondary | ICD-10-CM

## 2021-08-13 DIAGNOSIS — C8332 Diffuse large B-cell lymphoma, intrathoracic lymph nodes: Secondary | ICD-10-CM | POA: Diagnosis present

## 2021-08-13 DIAGNOSIS — Z7189 Other specified counseling: Secondary | ICD-10-CM | POA: Diagnosis not present

## 2021-08-13 DIAGNOSIS — I700 Atherosclerosis of aorta: Secondary | ICD-10-CM | POA: Diagnosis not present

## 2021-08-13 DIAGNOSIS — K219 Gastro-esophageal reflux disease without esophagitis: Secondary | ICD-10-CM | POA: Diagnosis present

## 2021-08-13 DIAGNOSIS — I350 Nonrheumatic aortic (valve) stenosis: Secondary | ICD-10-CM | POA: Diagnosis present

## 2021-08-13 DIAGNOSIS — R634 Abnormal weight loss: Secondary | ICD-10-CM | POA: Diagnosis not present

## 2021-08-13 DIAGNOSIS — N400 Enlarged prostate without lower urinary tract symptoms: Secondary | ICD-10-CM | POA: Diagnosis present

## 2021-08-13 DIAGNOSIS — R0602 Shortness of breath: Secondary | ICD-10-CM | POA: Diagnosis not present

## 2021-08-13 DIAGNOSIS — I495 Sick sinus syndrome: Secondary | ICD-10-CM | POA: Diagnosis not present

## 2021-08-13 DIAGNOSIS — Z9049 Acquired absence of other specified parts of digestive tract: Secondary | ICD-10-CM

## 2021-08-13 DIAGNOSIS — D509 Iron deficiency anemia, unspecified: Secondary | ICD-10-CM | POA: Diagnosis not present

## 2021-08-13 DIAGNOSIS — N281 Cyst of kidney, acquired: Secondary | ICD-10-CM | POA: Diagnosis not present

## 2021-08-13 DIAGNOSIS — E119 Type 2 diabetes mellitus without complications: Secondary | ICD-10-CM | POA: Diagnosis not present

## 2021-08-13 DIAGNOSIS — E860 Dehydration: Secondary | ICD-10-CM | POA: Diagnosis not present

## 2021-08-13 DIAGNOSIS — J90 Pleural effusion, not elsewhere classified: Principal | ICD-10-CM | POA: Diagnosis present

## 2021-08-13 DIAGNOSIS — Z9841 Cataract extraction status, right eye: Secondary | ICD-10-CM

## 2021-08-13 DIAGNOSIS — I252 Old myocardial infarction: Secondary | ICD-10-CM

## 2021-08-13 DIAGNOSIS — Z951 Presence of aortocoronary bypass graft: Secondary | ICD-10-CM

## 2021-08-13 DIAGNOSIS — Q231 Congenital insufficiency of aortic valve: Secondary | ICD-10-CM | POA: Diagnosis not present

## 2021-08-13 DIAGNOSIS — Z888 Allergy status to other drugs, medicaments and biological substances status: Secondary | ICD-10-CM

## 2021-08-13 DIAGNOSIS — N529 Male erectile dysfunction, unspecified: Secondary | ICD-10-CM | POA: Diagnosis present

## 2021-08-13 DIAGNOSIS — Z66 Do not resuscitate: Secondary | ICD-10-CM | POA: Diagnosis not present

## 2021-08-13 DIAGNOSIS — Z8249 Family history of ischemic heart disease and other diseases of the circulatory system: Secondary | ICD-10-CM

## 2021-08-13 DIAGNOSIS — I517 Cardiomegaly: Secondary | ICD-10-CM | POA: Diagnosis not present

## 2021-08-13 DIAGNOSIS — Z87891 Personal history of nicotine dependence: Secondary | ICD-10-CM

## 2021-08-13 DIAGNOSIS — R231 Pallor: Secondary | ICD-10-CM | POA: Diagnosis not present

## 2021-08-13 DIAGNOSIS — R7989 Other specified abnormal findings of blood chemistry: Secondary | ICD-10-CM | POA: Diagnosis present

## 2021-08-13 DIAGNOSIS — N323 Diverticulum of bladder: Secondary | ICD-10-CM | POA: Diagnosis not present

## 2021-08-13 DIAGNOSIS — C8582 Other specified types of non-Hodgkin lymphoma, intrathoracic lymph nodes: Secondary | ICD-10-CM | POA: Diagnosis not present

## 2021-08-13 DIAGNOSIS — J910 Malignant pleural effusion: Secondary | ICD-10-CM

## 2021-08-13 DIAGNOSIS — J948 Other specified pleural conditions: Secondary | ICD-10-CM | POA: Diagnosis not present

## 2021-08-13 DIAGNOSIS — Z515 Encounter for palliative care: Secondary | ICD-10-CM | POA: Diagnosis not present

## 2021-08-13 LAB — CBC WITH DIFFERENTIAL/PLATELET
Abs Immature Granulocytes: 0.08 10*3/uL — ABNORMAL HIGH (ref 0.00–0.07)
Basophils Absolute: 0.1 10*3/uL (ref 0.0–0.1)
Basophils Relative: 1 %
Eosinophils Absolute: 0.5 10*3/uL (ref 0.0–0.5)
Eosinophils Relative: 4 %
HCT: 39.8 % (ref 39.0–52.0)
Hemoglobin: 12.9 g/dL — ABNORMAL LOW (ref 13.0–17.0)
Immature Granulocytes: 1 %
Lymphocytes Relative: 5 %
Lymphs Abs: 0.5 10*3/uL — ABNORMAL LOW (ref 0.7–4.0)
MCH: 35.5 pg — ABNORMAL HIGH (ref 26.0–34.0)
MCHC: 32.4 g/dL (ref 30.0–36.0)
MCV: 109.6 fL — ABNORMAL HIGH (ref 80.0–100.0)
Monocytes Absolute: 1 10*3/uL (ref 0.1–1.0)
Monocytes Relative: 10 %
Neutro Abs: 8.8 10*3/uL — ABNORMAL HIGH (ref 1.7–7.7)
Neutrophils Relative %: 79 %
Platelets: 238 10*3/uL (ref 150–400)
RBC: 3.63 MIL/uL — ABNORMAL LOW (ref 4.22–5.81)
RDW: 14.6 % (ref 11.5–15.5)
WBC: 11 10*3/uL — ABNORMAL HIGH (ref 4.0–10.5)
nRBC: 0.2 % (ref 0.0–0.2)

## 2021-08-13 LAB — RESP PANEL BY RT-PCR (FLU A&B, COVID) ARPGX2
Influenza A by PCR: NEGATIVE
Influenza B by PCR: NEGATIVE
SARS Coronavirus 2 by RT PCR: NEGATIVE

## 2021-08-13 LAB — TROPONIN I (HIGH SENSITIVITY)
Troponin I (High Sensitivity): 26 ng/L — ABNORMAL HIGH (ref <18)
Troponin I (High Sensitivity): 25 ng/L — ABNORMAL HIGH (ref <18)

## 2021-08-13 LAB — BASIC METABOLIC PANEL
Anion gap: 9 (ref 5–15)
BUN: 29 mg/dL — ABNORMAL HIGH (ref 8–23)
CO2: 18 mmol/L — ABNORMAL LOW (ref 22–32)
Calcium: 11.5 mg/dL — ABNORMAL HIGH (ref 8.9–10.3)
Chloride: 110 mmol/L (ref 98–111)
Creatinine, Ser: 1.78 mg/dL — ABNORMAL HIGH (ref 0.61–1.24)
GFR, Estimated: 36 mL/min — ABNORMAL LOW (ref >60)
Glucose, Bld: 145 mg/dL — ABNORMAL HIGH (ref 70–99)
Potassium: 4.3 mmol/L (ref 3.5–5.1)
Sodium: 137 mmol/L (ref 135–145)

## 2021-08-13 LAB — BRAIN NATRIURETIC PEPTIDE: B Natriuretic Peptide: 163.6 pg/mL — ABNORMAL HIGH (ref 0.0–100.0)

## 2021-08-13 MED ORDER — INSULIN ASPART 100 UNIT/ML IJ SOLN
0.0000 [IU] | Freq: Three times a day (TID) | INTRAMUSCULAR | Status: DC
  Administered 2021-08-14 – 2021-08-15 (×3): 2 [IU] via SUBCUTANEOUS
  Administered 2021-08-16: 1 [IU] via SUBCUTANEOUS
  Administered 2021-08-16 – 2021-08-17 (×4): 2 [IU] via SUBCUTANEOUS
  Administered 2021-08-17: 1 [IU] via SUBCUTANEOUS
  Administered 2021-08-18: 2 [IU] via SUBCUTANEOUS
  Administered 2021-08-18 (×2): 1 [IU] via SUBCUTANEOUS
  Administered 2021-08-19 – 2021-08-20 (×3): 2 [IU] via SUBCUTANEOUS
  Administered 2021-08-20: 3 [IU] via SUBCUTANEOUS
  Administered 2021-08-20: 2 [IU] via SUBCUTANEOUS
  Administered 2021-08-21: 1 [IU] via SUBCUTANEOUS
  Administered 2021-08-22: 7 [IU] via SUBCUTANEOUS
  Administered 2021-08-22 – 2021-08-24 (×5): 2 [IU] via SUBCUTANEOUS
  Administered 2021-08-24: 1 [IU] via SUBCUTANEOUS
  Administered 2021-08-24 – 2021-08-26 (×6): 2 [IU] via SUBCUTANEOUS
  Administered 2021-08-27 (×2): 1 [IU] via SUBCUTANEOUS

## 2021-08-13 MED ORDER — SODIUM CHLORIDE 0.9 % IV SOLN: INTRAVENOUS | Status: AC

## 2021-08-13 MED ORDER — SODIUM CHLORIDE 0.9% FLUSH
3.0000 mL | Freq: Two times a day (BID) | INTRAVENOUS | Status: DC
  Administered 2021-08-14 – 2021-08-30 (×30): 3 mL via INTRAVENOUS

## 2021-08-13 MED ORDER — FINASTERIDE 5 MG PO TABS
5.0000 mg | ORAL_TABLET | Freq: Every day | ORAL | Status: DC
  Administered 2021-08-14 – 2021-08-30 (×16): 5 mg via ORAL
  Filled 2021-08-13 (×18): qty 1

## 2021-08-13 MED ORDER — ENOXAPARIN SODIUM 30 MG/0.3ML IJ SOSY
30.0000 mg | PREFILLED_SYRINGE | INTRAMUSCULAR | Status: DC
Start: 2021-08-14 — End: 2021-08-27
  Administered 2021-08-14 – 2021-08-26 (×13): 30 mg via SUBCUTANEOUS
  Filled 2021-08-13 (×13): qty 0.3

## 2021-08-13 MED ORDER — ROSUVASTATIN CALCIUM 5 MG PO TABS
10.0000 mg | ORAL_TABLET | ORAL | Status: DC
  Administered 2021-08-14 – 2021-08-26 (×5): 10 mg via ORAL
  Filled 2021-08-13 (×13): qty 2

## 2021-08-13 MED ORDER — ACETAMINOPHEN 650 MG RE SUPP: 650.0000 mg | Freq: Four times a day (QID) | RECTAL | Status: DC | PRN

## 2021-08-13 MED ORDER — LEVOTHYROXINE SODIUM 50 MCG PO TABS
50.0000 ug | ORAL_TABLET | Freq: Every day | ORAL | Status: DC
  Administered 2021-08-14 – 2021-08-30 (×16): 50 ug via ORAL
  Filled 2021-08-13 (×12): qty 1
  Filled 2021-08-13: qty 2
  Filled 2021-08-13 (×3): qty 1

## 2021-08-13 MED ORDER — ACETAMINOPHEN 325 MG PO TABS
650.0000 mg | ORAL_TABLET | Freq: Four times a day (QID) | ORAL | Status: DC | PRN
  Administered 2021-08-26 – 2021-08-28 (×2): 650 mg via ORAL
  Filled 2021-08-13 (×3): qty 2

## 2021-08-13 MED ORDER — IOHEXOL 350 MG/ML SOLN
45.0000 mL | Freq: Once | INTRAVENOUS | Status: AC | PRN
  Administered 2021-08-13: 45 mL via INTRAVENOUS

## 2021-08-13 MED ORDER — ONDANSETRON HCL 4 MG PO TABS: 4.0000 mg | ORAL_TABLET | Freq: Four times a day (QID) | ORAL | Status: DC | PRN

## 2021-08-13 MED ORDER — ONDANSETRON HCL 4 MG/2ML IJ SOLN: 4.0000 mg | Freq: Four times a day (QID) | INTRAMUSCULAR | Status: DC | PRN

## 2021-08-13 MED ORDER — SERTRALINE HCL 50 MG PO TABS
50.0000 mg | ORAL_TABLET | Freq: Every morning | ORAL | Status: DC
  Administered 2021-08-14 – 2021-08-30 (×16): 50 mg via ORAL
  Filled 2021-08-13 (×16): qty 1

## 2021-08-13 NOTE — ED Notes (Signed)
Pt transported to CT ?

## 2021-08-13 NOTE — ED Provider Notes (Signed)
[facility]ERGENCY DEPARTMENT Provider Note   CSN: [id number] Arrival date & time: 08/13/21  1214     History Shortness of breath  Seth Simpson is a 87 y.o. male with past medical history significant for hypertension, STEMI, diabetes, aortic stenosis who presents for evaluation of shortness of breath.  Seems to been going on over the last 6 weeks however worsened recently.  Has been seen by PCP for this.  He has significant dyspnea on exertion.  He has nonproductive cough.  Has recently started noticing some swelling to his bilateral feet which he does not have at baseline.  Does admit to some orthopnea however only sleeps with 1-2 pillows at night.  No hemoptysis.  No fever, chills, chest pain, abdominal pain.  Did have diarrhea a few weeks ago and had decreased appetite and subsequently stopped his metformin.  He continues to have overall decreased appetite however no diarrhea, melena or blood per rectum.  No urinary complaints.  Was seen by PCP today who was concerned for possible new onset CHF given his lower extremity edema.  Denies additional aggravating or alleviating factors  History obtained from patient and family in room.  No interpreter was used.  HPI     Past Medical History:  Diagnosis Date   Bicuspid aortic valve    Bladder tumor    BPH (benign prostatic hypertrophy)    Coronary artery disease cardiologist-  dr Marlou Porch   a. s/p CABG  x5  03/2008 - presentation acute STEMI   Depression    Erectile dysfunction    GERD (gastroesophageal reflux disease)    Gout    per pt 07-14-2017 stable   Heart murmur    Hiatal hernia    History of bladder stone    History of kidney stones    History of prostatitis    and hx orchitis   History of ST elevation myocardial infarction (STEMI)    04-10-2008  acute inferior   History of syncope    2011 sinoatrial node dysfunction  and 11-20-2014 presyncope w/ wide complex tachycardia resolved  w/ valvasa and  adenosine   Hyperlipidemia    Hypertension    Iron deficiency anemia    LAFB (left anterior fascicular block)    Lower urinary tract symptoms (LUTS)    Mild aortic stenosis    echo 04-23-2014  Valve Area 1.69 cm^2;  last echo 12-09-2016 valve area 1.47cm^2   OA (osteoarthritis)    PSVT (paroxysmal supraventricular tachycardia) (HCC)    PVC's (premature ventricular contractions)    Rheumatoid arthritis ([place]) 10/02/2013   Dr. Lenna Gilford, methotrexate   S/P CABG x 5    04-11-2008   Sigmoid diverticulosis    Type II diabetes mellitus ([place])    Wears glasses     Patient Active Problem List   Diagnosis Date Noted   Supraventricular tachycardia ([place])    Myocardial infarction ([place])    Type II diabetes mellitus ([place])    Sinus headache    PVC's (premature ventricular contractions)    Left anterior fascicular block    PSVT (paroxysmal supraventricular tachycardia) ([place]) 04/22/2015   High risk medication use 04/22/2015   Aortic stenosis 04/22/2015   LAFB (left anterior fascicular block) 04/22/2015   SVT (supraventricular tachycardia) ([place]) 11/20/2014   PVC (premature ventricular contraction) 10/22/2014   Ventricular bigeminy 10/22/2014   Mild aortic stenosis 10/22/2014   Rheumatoid arthritis ([place]) 10/02/2013   Coronary artery disease    Old MI (myocardial infarction)  Hypertension    Heart murmur    Anemia    GERD (gastroesophageal reflux disease)    Arthritis    ASVD (arteriosclerotic vascular disease)    Hypercholesteremia    Decreased libido    Orchitis and epididymitis    Prostatitis    BPH (benign prostatic hyperplasia)    Bradycardia    Syncope    Bicuspid aortic valve     Past Surgical History:  Procedure Laterality Date   [place]  04/06/2012   Procedure: BALLOON DILATION;  Surgeon: Garlan Fair, MD;  Location: [facility];  Service: Endoscopy;  Laterality: N/A;   CARDIAC CATHETERIZATION  04-10-2008  dr Daneen Schick   3 vessel CAD    CATARACT EXTRACTION W/ INTRAOCULAR LENS  IMPLANT, BILATERAL Bilateral 2003   CHOLECYSTECTOMY OPEN  1978   COLONOSCOPY  04/06/2012   Procedure: COLONOSCOPY;  Surgeon: Garlan Fair, MD;  Location: [facility];  Service: Endoscopy;  Laterality: N/A;   CORONARY ARTERY BYPASS GRAFT  04-11-2008   LIMA to RI,  SVG to D1,  SVG to LAD,  sequetial SVG to Acute Marginal and pRCA   CYSTOSCOPY W/ RETROGRADES Bilateral 07/26/2017   Procedure: CYSTOSCOPY WITH BILATERAL  RETROGRADE PYELOGRAM;  Surgeon: Irine Seal, MD;  Location: [facility];  Service: Urology;  Laterality: Bilateral;   CYSTOSCOPY WITH BIOPSY N/A 06/17/2016   Procedure: CYSTOSCOPY WITH BIOPSY;  Surgeon: Irine Seal, MD;  Location: [facility];  Service: Urology;  Laterality: N/A;   CYSTOSCOPY WITH BIOPSY N/A 07/26/2017   Procedure: urethral dilation CYSTOSCOPY WITH BIOPSY AND FULGURATION, removal of bladder stone;  Surgeon: Irine Seal, MD;  Location: [facility];  Service: Urology;  Laterality: N/A;   CYSTOSCOPY WITH LITHOLAPAXY N/A 06/17/2016   Procedure: CYSTOSCOPY WITH LITHOLAPAXY;  Surgeon: Irine Seal, MD;  Location: [facility];  Service: Urology;  Laterality: N/A;   CYSTOSCOPY WITH RETROGRADE PYELOGRAM, URETEROSCOPY AND STENT PLACEMENT  06-17-2016  dr Irine Seal   Cysto/  Bilateral RTG's/  Bladder bx and fulgeration bladder tumor's left & right lateral wall's/  Litholapaxy bladder stone/  left ureteroscopy/  Insertion bilateral double J stents   CYSTOSCOPY WITH RETROGRADE PYELOGRAM, URETEROSCOPY AND STENT PLACEMENT Bilateral 06/17/2016   Procedure: CYSTOSCOPY BILATERAL RETROGRADE,  LEFT URETEROSCOPY AND BILATERAL STENT PLACEMENT;  Surgeon: Irine Seal, MD;  Location: [facility];  Service: Urology;  Laterality: Bilateral;   CYSTOSCOPY WITH URETEROSCOPY AND STENT PLACEMENT Bilateral 07/01/2016   Procedure: BILATERAL URETEROSCOPY STONE EXTRACTIONS WITH POSSIBLE STENT  PLACEMENT;  Surgeon: Irine Seal, MD;  Location[facility];  Service: Urology;  Laterality: Bilateral;   ESOPHAGOGASTRODUODENOSCOPY  04/06/2012   Procedure: ESOPHAGOGASTRODUODENOSCOPY (EGD);  Surgeon: Garlan Fair, MD;  Location: [facility];  Service: Endoscopy;  Laterality: N/A;   FINGER SURGERY Left 2000's   "removed BB", 2nd digit   HOLMIUM LASER APPLICATION N/A 0000000   Procedure: HOLMIUM LASER APPLICATION;  Surgeon: Irine Seal, MD;  Location: [facility];  Service: Urology;  Laterality: N/A;   HOLMIUM LASER APPLICATION Bilateral [id number]   Procedure: HOLMIUM LASER APPLICATION;  Surgeon: Irine Seal, MD;  Location: [facility];  Service: Urology;  Laterality: Bilateral;   TOTAL KNEE ARTHROPLASTY Bilateral right 03-20-2008//  left 08-23-2007   TRANSTHORACIC ECHOCARDIOGRAM  12-09-2016 dr Marlou Porch   mild LVH,  ef 50-55%/  Bicuspid AV with stenosis, valve area 1.47 cm^2 (previous echo it was 1.67cm^2 on 04-23-2014), trivial regurg/  mild LAE/ mild MR/  moderate TR, PASP 65mHg/        Family History  Problem Relation Age of Onset   Heart disease Father     Social History   Tobacco Use   Smoking status: Former    Packs/day: 1.00    Years: 15.00    Pack years: 15.00    Types: Cigarettes    Quit date: 09/29/1964    Years since quitting: 56.9   Smokeless tobacco: Never  Substance Use Topics   Alcohol use: Yes    Alcohol/week: 2.0 standard drinks    Types: 2 Glasses of wine per week   Drug use: No    Home Medications Prior to Admission medications   Medication Sig Start Date End Date Taking? Authorizing Provider  ACCU-CHEK AVIVA PLUS test strip  11/10/15   [provider]  ACCU-CHEK SOFTCLIX LANCETS lancets USE TO TEST BLOOD SUGAR DAILY 08/15/15   [provider]  acetaminophen (TYLENOL) 500 MG tablet Take 500 mg by mouth every 6 (six) hours as needed for mild pain, moderate pain, fever or headache.    [provider]  allopurinol (ZYLOPRIM) 300 MG tablet Take 300 mg by mouth every evening.     [provider]  amiodarone (PACERONE) 200 MG tablet Take 0.5 tablets (100 mg total) by mouth daily. 03/18/21   SJerline Pain MD  amLODipine-benazepril (LOTREL) 5-20 MG per capsule Take 1 capsule by mouth daily.    [provider]  Cholecalciferol (VITAMIN D3) 2000 units TABS Take 1 tablet by mouth every evening.    [provider]  colchicine 0.6 MG tablet Take 0.6 mg by mouth daily as needed (gout).     [provider]  diphenhydrAMINE (BENADRYL) 25 MG tablet Take 25 mg by mouth daily as needed for allergies. Reported on 06/19/2016    [provider]  ferrous sulfate 325 (65 FE) MG tablet Take 650 mg by mouth every evening.     [provider]  finasteride (PROSCAR) 5 MG tablet Take 5 mg by mouth daily.    [provider]  Flaxseed, Linseed, (FLAXSEED OIL MAX STR) 1300 MG CAPS Take 1 capsule by mouth every evening.     [provider]  folic acid (FOLVITE) 1 MG tablet Take 2 mg by mouth every morning.     [provider]  hydrALAZINE (APRESOLINE) 25 MG tablet Take 25 mg by mouth 2 (two) times daily. 01/17/21   [provider]  levothyroxine (SYNTHROID) 50 MCG tablet Take 50 mcg by mouth daily.    [provider]  loratadine (CLARITIN) 10 MG tablet Take 10 mg by mouth every morning.     [provider]  metFORMIN (GLUCOPHAGE) 500 MG tablet Take 500 mg by mouth 2 (two) times daily with a meal.  11/04/14   [provider]  methotrexate (RHEUMATREX) 2.5 MG tablet Take 10 mg by mouth every Friday. Take on Fridays.  Caution:Chemotherapy. Protect from light.    [provider]  metoprolol succinate (TOPROL-XL) 25 MG 24 hr tablet Take 1 tablet (25 mg total) by mouth daily. 11/27/18   SJerline Pain MD  Multiple Vitamin (MULTIVITAMIN WITH MINERALS) TABS tablet Take 1 tablet by mouth every  evening.     [provider]  naproxen (NAPRELAN) 500 MG 24 hr tablet Take 500 mg by mouth daily as needed (Pain).     [provider]  omeprazole (PRILOSEC OTC) 20 MG tablet Take 10  mg by mouth as needed.     [provider]  rosuvastatin (CRESTOR) 10 MG tablet Take 10 mg by mouth every Monday, Wednesday, and Friday.     [provider]  sertraline (ZOLOFT) 100 MG tablet Take 50 mg by mouth every morning.  10/05/14   [provider]  sildenafil (VIAGRA) 100 MG tablet Take 100 mg by mouth daily as needed for erectile dysfunction.    [provider]  tamsulosin (FLOMAX) 0.4 MG CAPS capsule Take 0.4 mg by mouth daily. 10/31/17   [provider]  terazosin (HYTRIN) 5 MG capsule Take 5 mg by mouth at bedtime.     [provider]  triamcinolone (NASACORT) 55 MCG/ACT AERO nasal inhaler Place 2 sprays into the nose daily as needed (Nasal congestion).    [provider]    Allergies    Peanut allergen powder-dnfp, Peanut-containing drug products, Hydrochlorothiazide, Other, Pistachio nut (diagnostic), Codeine, and Fruit & vegetable daily [nutritional supplements]  Review of Systems   Review of Systems  Constitutional:  Positive for activity change and fatigue.  HENT: Negative.    Respiratory:  Positive for cough and shortness of breath. Negative for apnea, choking, chest tightness, wheezing and stridor.   Cardiovascular:  Positive for leg swelling. Negative for chest pain and palpitations.  Gastrointestinal: Negative.   Genitourinary: Negative.   Musculoskeletal: Negative.   Skin: Negative.   Neurological:  Positive for weakness. Negative for dizziness, tremors, syncope, facial asymmetry, speech difficulty, light-headedness, numbness and headaches.  All other systems reviewed and are negative.  Physical Exam Updated Vital Signs BP 135/70   Pulse 68   Temp 97.7 F (36.5 C) (Oral)   Resp 12   SpO2 96%    Physical Exam Vitals and nursing note reviewed.  Constitutional:      General: He is not in acute distress.    Appearance: He is well-developed. He is ill-appearing. He is not toxic-appearing or diaphoretic.  HENT:     Head: Normocephalic and atraumatic.     Nose: Nose normal.     Mouth/Throat:     Mouth: Mucous membranes are moist.  Eyes:     Pupils: Pupils are equal, round, and reactive to light.  Cardiovascular:     Rate and Rhythm: Normal rate and regular rhythm.     Pulses: Normal pulses.          Radial pulses are 2+ on the right side and 2+ on the left side.       Dorsalis pedis pulses are 2+ on the right side and 2+ on the left side.     Heart sounds: Murmur heard.  Pulmonary:     Effort: Pulmonary effort is normal. No respiratory distress.     Breath sounds: Normal breath sounds.     Comments: Coarse lung sounds, speaks without difficulty.  Moderate dyspnea on exertion Abdominal:     General: Bowel sounds are normal. There is no distension.     Palpations: Abdomen is soft.     Tenderness: There is no abdominal tenderness. There is no right CVA tenderness, left CVA tenderness, guarding or rebound.  Musculoskeletal:        General: No swelling, tenderness, deformity or signs of injury. Normal range of motion.     Cervical back: Normal range of motion and neck supple.     Right lower leg: Edema present.     Left lower leg: Edema present.     Comments: 2+ pitting edema to bilateral  feet, no overlying erythema or warmth  Skin:    General: Skin is warm and dry.     Capillary Refill: Capillary refill takes less than 2 seconds.  Neurological:     General: No focal deficit present.     Mental Status: He is alert and oriented to person, place, and time. Mental status is at baseline.    ED Results / Procedures / Treatments   Labs (all labs ordered are listed, but only abnormal results are displayed) Labs Reviewed  CBC WITH DIFFERENTIAL/PLATELET - Abnormal; Notable for  the following components:      Result Value   WBC 11.0 (*)    RBC 3.63 (*)    Hemoglobin 12.9 (*)    MCV 109.6 (*)    MCH 35.5 (*)    Neutro Abs 8.8 (*)    Lymphs Abs 0.5 (*)    Abs Immature Granulocytes 0.08 (*)    All other components within normal limits  BASIC METABOLIC PANEL - Abnormal; Notable for the following components:   CO2 18 (*)    Glucose, Bld 145 (*)    BUN 29 (*)    Creatinine, Ser 1.78 (*)    Calcium 11.5 (*)    GFR, Estimated 36 (*)    All other components within normal limits  BRAIN NATRIURETIC PEPTIDE - Abnormal; Notable for the following components:   B Natriuretic Peptide 163.6 (*)    All other components within normal limits  TROPONIN I (HIGH SENSITIVITY) - Abnormal; Notable for the following components:   Troponin I (High Sensitivity) 26 (*)    All other components within normal limits  TROPONIN I (HIGH SENSITIVITY) - Abnormal; Notable for the following components:   Troponin I (High Sensitivity) 25 (*)    All other components within normal limits  RESP PANEL BY RT-PCR (FLU A&B, COVID) [id number]    EKG EKG Interpretation  Date/Time:  Thursday August 13 2021 12:15:58 EDT Ventricular Rate:  80 PR Interval:  196 QRS Duration: 140 QT Interval:  430 QTC Calculation: 495 R Axis:   -39 Text Interpretation: Normal sinus rhythm Left axis deviation Left bundle branch block Abnormal ECG Confirmed by Carmin Muskrat [phone]) on 08/13/2021 12:28:08 PM  Radiology DG Chest 1 View  Result Date: 08/13/2021 CLINICAL DATA:  Shortness of breath. EXAM: CHEST  1 VIEW COMPARISON:  11/28/2018 FINDINGS: The cardio pericardial silhouette is enlarged. Interstitial markings are diffusely coarsened with chronic features. Right base collapse/consolidation noted with moderate right pleural effusion. Soft tissue fullness noted left suprahilar region. Patchy airspace disease noted right base. Bones are diffusely demineralized. Status post CABG. IMPRESSION: Left base  collapse/consolidation with left suprahilar fullness and moderate left effusion. Given asymmetry of findings, underlying neoplasm a concern. Chest CT with contrast recommended to further evaluate. Electronically Signed   By: Misty Stanley M.D.   On: 08/13/2021 13:00    Procedures Procedures   Medications Ordered in ED Medications - No data to display  ED Course  I have reviewed the triage vital signs and the nursing notes.  Pertinent labs & imaging results that were available during my care of the patient were reviewed by me and considered in my medical decision making (see chart for details).  87 year old here for evaluation of worsening shortness of breath and lower extremity edema.  No formal diagnosis of CHF however does have severe aortic stenosis.  Concern with PCP patient had new onset CHF.  Does appear to be fluid overloaded to his lower extremities.  Does  have a murmur which is chronic.  Has some coarse lung sounds.  Abdomen soft, nontender.  Work-up started from triage which I personally reviewed and interpreted: CBC with leukocytosis at 11.0, hemoglobin 0000000 Metabolic panel glucose Q000111Q, creatinine 1.70 GFR 36 Troponin flat 26>>25 BNP 163.6 COVID negative DG chest with left base collapse/consolidation with superolateral fullness and moderate effusion, concern for neoplasm recommend CT chest  Patient reassessed.  Has had some oxygen saturations dipping into the high 80s.  Subsequently placed on 2 L via nasal cannula  Care transferred to Dr. Dina Rich who will follow up on imaging.  Suspect will need admission given hypoxia here.     MDM Rules/Calculators/A&P                           Seth Simpson was evaluated in Emergency Department on 08/13/2021 for the symptoms described in the history of present illness. He was evaluated in the context of the global COVID-19 pandemic, which necessitated consideration that the patient might be at risk for infection with the SARS-CoV-2 virus  that causes COVID-19. Institutional protocols and algorithms that pertain to the evaluation of patients at risk for COVID-19 are in a state of rapid change based on information released by regulatory bodies including the CDC and federal and state organizations. These policies and algorithms were followed during the patient's care in the ED.  Final Clinical Impression(s) / ED Diagnoses Final diagnoses:  Hypoxia    Rx / DC Orders ED Discharge Orders     None        Elia Nunley A, PA-C 08/13/21 1911    Lorelle Gibbs, DO 08/13/21 2253

## 2021-08-13 NOTE — ED Notes (Signed)
Pt's daughter Lendon Ka requests a call to inform her of room assignment once pt has been moved to an inpatient unit. [phone]

## 2021-08-13 NOTE — ED Notes (Signed)
Hospitalist at bedside 

## 2021-08-13 NOTE — ED Notes (Signed)
Pt returned from CT °

## 2021-08-13 NOTE — ED Provider Notes (Addendum)
Patient signed out to me by previous provider. Please refer to their note for full HPI.  Briefly is a 87 year old male who presented the emergency department shortness of breath, requiring supplemental oxygen due to hypoxia on room air.  Chest x-ray shows left pleural effusion, CT recommended.  Pending CT imaging. Physical Exam  BP 132/65   Pulse 66   Temp 97.7 F (36.5 C) (Oral)   Resp 18   Wt 65.8 kg   SpO2 95%   BMI 22.06 kg/m   Physical Exam Vitals and nursing note reviewed.  Constitutional:      Appearance: Normal appearance.  HENT:     Head: Normocephalic.     Mouth/Throat:     Mouth: Mucous membranes are moist.  Cardiovascular:     Rate and Rhythm: Normal rate.  Pulmonary:     Effort: Pulmonary effort is normal. No respiratory distress.     Comments: Diminished breath sounds on the left with rales Abdominal:     Palpations: Abdomen is soft.     Tenderness: no abdominal tenderness  Skin:    General: Skin is warm.  Neurological:     Mental Status: He is alert and oriented to person, place, and time. Mental status is at baseline.  Psychiatric:        Mood and Affect: Mood normal.    ED Course/Procedures     Procedures  MDM   CAT scan confirms a left-sided/hilar mass with effusion.  Patient will require medical admission for further evaluation and treatment.  Patients evaluation and results requires admission for further treatment and care. Patient agrees with admission plan, offers no new complaints and is stable/unchanged at time of admit.       Lorelle Gibbs, DO 08/13/21 2255    Lorelle Gibbs, DO 08/13/21 2301

## 2021-08-13 NOTE — H&P (Signed)
History and Physical    Seth Simpson [id number] DOB: [birth year] DOA: 08/13/2021  PCP: Josetta Huddle, MD  Patient coming from: Home  I have personally briefly reviewed patient's old medical records in [place]  Chief Complaint: Shortness of breath  HPI: Seth Simpson is a 87 y.o. male with medical history significant for CAD s/p CABG, paroxysmal SVT on amiodarone, frequent PVCs, LBBB, T2DM, HTN, HLD, hypothyroidism, depression, BPH who presented to the ED for evaluation of dyspnea.  Patient states that about 1 month ago he had a fall resulting in a bruised hip.  He has been requiring the use of a walker to ambulate.  Over the last 2 weeks he has been having dyspnea, more noticeable with minimal exertion.  He has had infrequent nonproductive cough.  He reports several days of recent nausea, vomiting, and diarrhea.  He has not had any chest pain.  He reports slightly diminished urine output than baseline without dysuria.  He reports a prior history of tobacco use, smoking 1 pack/day from ages [age].  ED Course:  Initial vitals showed BP 102/63, pulse 79, RR 16, temp 97.7 F, SPO2 93% on room air.  While in the ED patient became hypoxic with SPO2 down to 89%, improved on 2 L supplemental O2 via [place].  Labs show WBC 11.0, hemoglobin 12.9, platelets 238,000, sodium 137, potassium 4.3, bicarb 18, BUN 29, creatinine 1.78, serum glucose 145, calcium 11.5, BNP 163.6, high-sensitivity troponin I 26 > 25.  SARS-CoV-2 and influenza negative.  Portable chest x-ray shows left base collapse/consolidation with left suprahilar fullness and moderate left effusion.  Follow-up CT chest with contrast shows a 4.0 cm medial left upper lobe/perihilar mass extending into the left hilum concerning for primary bronchogenic neoplasm.  Associated thoracic nodal metastases, moderate left pleural effusion, and postobstructive opacity/atelectasis in the lingula and left lower lobe noted.  The hospitalist  service was consulted to admit for further evaluation and management.  Review of Systems: All systems reviewed and are negative except as documented in history of present illness above.   Past Medical History:  Diagnosis Date   Bicuspid aortic valve    Bladder tumor    BPH (benign prostatic hypertrophy)    Coronary artery disease cardiologist-  dr Marlou Porch   a. s/p CABG  x5  03/2008 - presentation acute STEMI   Depression    Erectile dysfunction    GERD (gastroesophageal reflux disease)    Gout    per pt 07-14-2017 stable   Heart murmur    Hiatal hernia    History of bladder stone    History of kidney stones    History of prostatitis    and hx orchitis   History of ST elevation myocardial infarction (STEMI)    04-10-2008  acute inferior   History of syncope    2011 sinoatrial node dysfunction  and 11-20-2014 presyncope w/ wide complex tachycardia resolved  w/ valvasa and adenosine   Hyperlipidemia    Hypertension    Iron deficiency anemia    LAFB (left anterior fascicular block)    Lower urinary tract symptoms (LUTS)    Mild aortic stenosis    echo 04-23-2014  Valve Area 1.69 cm^2;  last echo 12-09-2016 valve area 1.47cm^2   OA (osteoarthritis)    PSVT (paroxysmal supraventricular tachycardia) (HCC)    PVC's (premature ventricular contractions)    Rheumatoid arthritis ([place]) 10/02/2013   Dr. Lenna Gilford, methotrexate   S/P CABG x 5    04-11-2008  Sigmoid diverticulosis    Type II diabetes mellitus ([place])    Wears glasses     Past Surgical History:  Procedure Laterality Date   [place]  04/06/2012   Procedure: BALLOON DILATION;  Surgeon: Garlan Fair, MD;  Location: [facility];  Service: Endoscopy;  Laterality: N/A;   CARDIAC CATHETERIZATION  04-10-2008  dr Daneen Schick   3 vessel CAD   CATARACT EXTRACTION W/ INTRAOCULAR LENS  IMPLANT, BILATERAL Bilateral 2003   CHOLECYSTECTOMY OPEN  1978   COLONOSCOPY  04/06/2012   Procedure: COLONOSCOPY;   Surgeon: Garlan Fair, MD;  Location: [facility];  Service: Endoscopy;  Laterality: N/A;   CORONARY ARTERY BYPASS GRAFT  04-11-2008   LIMA to RI,  SVG to D1,  SVG to LAD,  sequetial SVG to Acute Marginal and pRCA   CYSTOSCOPY W/ RETROGRADES Bilateral 07/26/2017   Procedure: CYSTOSCOPY WITH BILATERAL  RETROGRADE PYELOGRAM;  Surgeon: Irine Seal, MD;  Location: [facility];  Service: Urology;  Laterality: Bilateral;   CYSTOSCOPY WITH BIOPSY N/A 06/17/2016   Procedure: CYSTOSCOPY WITH BIOPSY;  Surgeon: Irine Seal, MD;  Location: [facility];  Service: Urology;  Laterality: N/A;   CYSTOSCOPY WITH BIOPSY N/A 07/26/2017   Procedure: urethral dilation CYSTOSCOPY WITH BIOPSY AND FULGURATION, removal of bladder stone;  Surgeon: Irine Seal, MD;  Location: [facility];  Service: Urology;  Laterality: N/A;   CYSTOSCOPY WITH LITHOLAPAXY N/A 06/17/2016   Procedure: CYSTOSCOPY WITH LITHOLAPAXY;  Surgeon: Irine Seal, MD;  Location: [facility];  Service: Urology;  Laterality: N/A;   CYSTOSCOPY WITH RETROGRADE PYELOGRAM, URETEROSCOPY AND STENT PLACEMENT  06-17-2016  dr Irine Seal   Cysto/  Bilateral RTG's/  Bladder bx and fulgeration bladder tumor's left & right lateral wall's/  Litholapaxy bladder stone/  left ureteroscopy/  Insertion bilateral double J stents   CYSTOSCOPY WITH RETROGRADE PYELOGRAM, URETEROSCOPY AND STENT PLACEMENT Bilateral 06/17/2016   Procedure: CYSTOSCOPY BILATERAL RETROGRADE,  LEFT URETEROSCOPY AND BILATERAL STENT PLACEMENT;  Surgeon: Irine Seal, MD;  Location: [facility];  Service: Urology;  Laterality: Bilateral;   CYSTOSCOPY WITH URETEROSCOPY AND STENT PLACEMENT Bilateral 07/01/2016   Procedure: BILATERAL URETEROSCOPY STONE EXTRACTIONS WITH POSSIBLE STENT PLACEMENT;  Surgeon: Irine Seal, MD;  Location: [facility];  Service: Urology;  Laterality: Bilateral;   ESOPHAGOGASTRODUODENOSCOPY  04/06/2012    Procedure: ESOPHAGOGASTRODUODENOSCOPY (EGD);  Surgeon: Garlan Fair, MD;  Location: [facility];  Service: Endoscopy;  Laterality: N/A;   FINGER SURGERY Left 2000's   "removed BB", 2nd digit   HOLMIUM LASER APPLICATION N/A [id number]   Procedure: HOLMIUM LASER APPLICATION;  Surgeon: Irine Seal, MD;  Location: [facility];  Service: Urology;  Laterality: N/A;   HOLMIUM LASER APPLICATION Bilateral [id number]   Procedure: HOLMIUM LASER APPLICATION;  Surgeon: Irine Seal, MD;  Location: [facility];  Service: Urology;  Laterality: Bilateral;   TOTAL KNEE ARTHROPLASTY Bilateral right 03-20-2008//  left 08-23-2007   TRANSTHORACIC ECHOCARDIOGRAM  12-09-2016 dr Marlou Porch   mild LVH,  ef 50-55%/  Bicuspid AV with stenosis, valve area 1.47 cm^2 (previous echo it was 1.67cm^2 on 04-23-2014), trivial regurg/  mild LAE/ mild MR/  moderate TR, PASP 71mHg/     Social History:  reports that he quit smoking about 56 years ago. His smoking use included cigarettes. He has a 15.00 pack-year smoking history. He has never used smokeless tobacco. He reports current alcohol use of about 2.0 standard  drinks per week. He reports that he does not use drugs.  Allergies  Allergen Reactions   Peanut Allergen Powder-Dnfp Anaphylaxis   Peanut-Containing Drug Products Anaphylaxis, Shortness Of Breath and Swelling    Peanuts... Anaphylaxis   Hydrochlorothiazide Other (See Comments)    Avoids , flares up Gout   Other Diarrhea    Splenda   Pistachio Nut (Diagnostic) Swelling and Other (See Comments)    gout   Codeine Itching and Rash   Fruit & Vegetable Daily [Nutritional Supplements] Rash    Papaya    Family History  Problem Relation Age of Onset   Heart disease Father      Prior to Admission medications   Medication Sig Start Date End Date Taking? Authorizing Provider  ACCU-CHEK AVIVA PLUS test strip  11/10/15   [provider]  ACCU-CHEK SOFTCLIX LANCETS lancets USE TO  TEST BLOOD SUGAR DAILY 08/15/15   [provider]  acetaminophen (TYLENOL) 500 MG tablet Take 500 mg by mouth every 6 (six) hours as needed for mild pain, moderate pain, fever or headache.    [provider]  allopurinol (ZYLOPRIM) 300 MG tablet Take 300 mg by mouth every evening.     [provider]  amiodarone (PACERONE) 200 MG tablet Take 0.5 tablets (100 mg total) by mouth daily. 03/18/21   Jerline Pain, MD  amLODipine-benazepril (LOTREL) 5-20 MG per capsule Take 1 capsule by mouth daily.    [provider]  Cholecalciferol (VITAMIN D3) 2000 units TABS Take 1 tablet by mouth every evening.    [provider]  colchicine 0.6 MG tablet Take 0.6 mg by mouth daily as needed (gout).     [provider]  diphenhydrAMINE (BENADRYL) 25 MG tablet Take 25 mg by mouth daily as needed for allergies. Reported on 06/19/2016    [provider]  ferrous sulfate 325 (65 FE) MG tablet Take 650 mg by mouth every evening.     [provider]  finasteride (PROSCAR) 5 MG tablet Take 5 mg by mouth daily.    [provider]  Flaxseed, Linseed, (FLAXSEED OIL MAX STR) 1300 MG CAPS Take 1 capsule by mouth every evening.     [provider]  folic acid (FOLVITE) 1 MG tablet Take 2 mg by mouth every morning.     [provider]  hydrALAZINE (APRESOLINE) 25 MG tablet Take 25 mg by mouth 2 (two) times daily. 01/17/21   [provider]  levothyroxine (SYNTHROID) 50 MCG tablet Take 50 mcg by mouth daily.    [provider]  loratadine (CLARITIN) 10 MG tablet Take 10 mg by mouth every morning.     [provider]  metFORMIN (GLUCOPHAGE) 500 MG tablet Take 500 mg by mouth 2 (two) times daily with a meal.  11/04/14   [provider]  methotrexate (RHEUMATREX) 2.5 MG tablet Take 10 mg by mouth every Friday. Take on Fridays.  Caution:Chemotherapy. Protect from light.    [provider]  metoprolol  succinate (TOPROL-XL) 25 MG 24 hr tablet Take 1 tablet (25 mg total) by mouth daily. 11/27/18   Jerline Pain, MD  Multiple Vitamin (MULTIVITAMIN WITH MINERALS) TABS tablet Take 1 tablet by mouth every evening.     [provider]  naproxen (NAPRELAN) 500 MG 24 hr tablet Take 500 mg by mouth daily as needed (Pain).     [provider]  omeprazole (PRILOSEC OTC) 20 MG tablet Take 10 mg by mouth as needed.  [provider]  rosuvastatin (CRESTOR) 10 MG tablet Take 10 mg by mouth every Monday, Wednesday, and Friday.     [provider]  sertraline (ZOLOFT) 100 MG tablet Take 50 mg by mouth every morning.  10/05/14   [provider]  sildenafil (VIAGRA) 100 MG tablet Take 100 mg by mouth daily as needed for erectile dysfunction.    [provider]  tamsulosin (FLOMAX) 0.4 MG CAPS capsule Take 0.4 mg by mouth daily. 10/31/17   [provider]  terazosin (HYTRIN) 5 MG capsule Take 5 mg by mouth at bedtime.     [provider]  triamcinolone (NASACORT) 55 MCG/ACT AERO nasal inhaler Place 2 sprays into the nose daily as needed (Nasal congestion).    [provider]    Physical Exam: Vitals:   08/13/21 1815 08/13/21 1900 08/13/21 1957 08/13/21 2100  BP: 135/70 130/71 (!) 141/68 132/65  Pulse: 68 68 71 66  Resp: '12 13 15 18  '$ Temp:      TempSrc:      SpO2: 96% 95% 94% 95%   Constitutional: Thin elderly man resting supine in bed, NAD, calm, comfortable Eyes: PERRL, lids and conjunctivae normal ENMT: Mucous membranes are moist. Posterior pharynx clear of any exudate or lesions.Normal dentition.  Neck: normal, supple, no masses. Respiratory: Diminished breath sounds left lung base otherwise clear to auscultation. Normal respiratory effort while on 2 L O2 via [place]. No accessory muscle use.  Cardiovascular: Regular rate and rhythm, 3/6 systolic murmur throughout. No extremity edema. 2+ pedal pulses. Abdomen: no tenderness,  no masses palpated. No hepatosplenomegaly. Bowel sounds positive.  Musculoskeletal: no clubbing / cyanosis. No joint deformity upper and lower extremities. Good ROM, no contractures. Normal muscle tone.  Skin: no rashes, lesions, ulcers. No induration Neurologic: CN 2-12 grossly intact. Sensation intact. Strength 5/5 in all 4.  Psychiatric: Normal judgment and insight. Alert and oriented x 3. Normal mood.   Labs on Admission: I have personally reviewed following labs and imaging studies  CBC: Recent Labs  Lab 08/13/21 1227  WBC 11.0*  NEUTROABS 8.8*  HGB 12.9*  HCT 39.8  MCV 109.6*  PLT [id number]   Basic Metabolic Panel: Recent Labs  Lab 08/13/21 1227  NA 137  K 4.3  CL 110  CO2 18*  GLUCOSE 145*  BUN 29*  CREATININE 1.78*  CALCIUM 11.5*   GFR: CrCl cannot be calculated (Unknown ideal weight.). Liver Function Tests: No results for input(s): AST, ALT, ALKPHOS, BILITOT, PROT, ALBUMIN in the last 168 hours. No results for input(s): LIPASE, AMYLASE in the last 168 hours. No results for input(s): AMMONIA in the last 168 hours. Coagulation Profile: No results for input(s): INR, PROTIME in the last 168 hours. Cardiac Enzymes: No results for input(s): CKTOTAL, CKMB, CKMBINDEX, TROPONINI in the last 168 hours. BNP (last 3 results) No results for input(s): PROBNP in the last 8760 hours. HbA1C: No results for input(s): HGBA1C in the last 72 hours. CBG: No results for input(s): GLUCAP in the last 168 hours. Lipid Profile: No results for input(s): CHOL, HDL, LDLCALC, TRIG, CHOLHDL, LDLDIRECT in the last 72 hours. Thyroid Function Tests: No results for input(s): TSH, T4TOTAL, FREET4, T3FREE, THYROIDAB in the last 72 hours. Anemia Panel: No results for input(s): VITAMINB12, FOLATE, FERRITIN, TIBC, IRON, RETICCTPCT in the last 72 hours. Urine analysis:    Component Value Date/Time   COLORURINE RED (A) 06/19/2016 0642   APPEARANCEUR CLOUDY (A) 06/19/2016 0642   LABSPEC 1.016  06/19/2016 [id number]  PHURINE 6.0 06/19/2016 0642   GLUCOSEU NEGATIVE 06/19/2016 0642   HGBUR LARGE (A) 06/19/2016 0642   BILIRUBINUR NEGATIVE 06/19/2016 0642   KETONESUR NEGATIVE 06/19/2016 0642   PROTEINUR 100 (A) 06/19/2016 0642   UROBILINOGEN 0.2 08/13/2010 0647   NITRITE NEGATIVE 06/19/2016 0642   LEUKOCYTESUR MODERATE (A) 06/19/2016 0642    Radiological Exams on Admission: DG Chest 1 View  Result Date: 08/13/2021 CLINICAL DATA:  Shortness of breath. EXAM: CHEST  1 VIEW COMPARISON:  11/28/2018 FINDINGS: The cardio pericardial silhouette is enlarged. Interstitial markings are diffusely coarsened with chronic features. Right base collapse/consolidation noted with moderate right pleural effusion. Soft tissue fullness noted left suprahilar region. Patchy airspace disease noted right base. Bones are diffusely demineralized. Status post CABG. IMPRESSION: Left base collapse/consolidation with left suprahilar fullness and moderate left effusion. Given asymmetry of findings, underlying neoplasm a concern. Chest CT with contrast recommended to further evaluate. Electronically Signed   By: Misty Stanley M.D.   On: 08/13/2021 13:00   CT Chest W Contrast  Result Date: 08/13/2021 CLINICAL DATA:  Shortness of breath EXAM: CT CHEST WITH CONTRAST TECHNIQUE: Multidetector CT imaging of the chest was performed during intravenous contrast administration. CONTRAST:  13m OMNIPAQUE IOHEXOL 350 MG/ML SOLN COMPARISON:  Chest radiograph dated 08/13/2021 FINDINGS: Cardiovascular: The heart is normal in size. No pericardial effusion. No evidence of thoracic aortic aneurysm. Atherosclerotic calcifications of the aortic arch. Coronary atherosclerosis of the LAD and right coronary artery. Postsurgical changes related to prior CABG. Mediastinum/Nodes: Thoracic lymphadenopathy, including: --16 mm short axis node at the right thoracic inlet (series 3/image 26) --19 mm short axis AP window node (series 3/image 59) --18 mm short  axis right paratracheal node (series 3/image 62) --20 mm short axis right hilar node (series 3/image 79) --23 mm short axis subcarinal node (series 3/image 82) --22 mm short axis left perihilar node (series 3/image 87) Visualized thyroid is unremarkable. Lungs/Pleura: 4.0 x 2.4 cm mass in the medial left upper lobe/perihilar region (series 3/image 67). Additional tumor along the left mediastinum (series 3/image 56) and extending to the left hilum. Associated lingular and left lower lobe postobstructive opacity/atelectasis, underlying tumor not excluded. Moderate left pleural effusion. Some degree of pleural-based nodularity anteriorly (series 3/image 105) and along the posteromedial base (series 3/image 129) suggests a malignant pleural effusion. Mild centrilobular and paraseptal emphysematous changes. Biapical pleural-parenchymal scarring. Mild subpleural reticulation/fibrosis in the right lower lobe. Trace right pleural fluid. No pneumothorax. Upper Abdomen: Visualized upper abdomen is notable for bilateral renal cysts and vascular calcifications. Musculoskeletal: Degenerative changes of the visualized thoracolumbar spine. Median sternotomy. IMPRESSION: 4.0 cm medial left upper lobe/perihilar mass, extending into the left hilum, compatible with primary bronchogenic neoplasm. Associated thoracic nodal metastases. Postobstructive opacity/atelectasis in the lingula and left lower lobe. Moderate left pleural effusion, favored to be malignant. Aortic Atherosclerosis (ICD10-I70.0) and Emphysema (ICD10-J43.9). Electronically Signed   By: SJulian HyM.D.   On: 08/13/2021 20:10    EKG: Personally reviewed. Sinus rhythm, LBBB.  Similar to prior.  Assessment/Plan Principal Problem:   Acute respiratory failure with hypoxia (HCC) Active Problems:   Coronary artery disease   Hypertension   Hypercholesteremia   PSVT (paroxysmal supraventricular tachycardia) (HCC)   Type II diabetes mellitus (HCC)   Mass of  upper lobe of left lung   Pleural effusion on left   Seth HAGEEis a 87y.o. male with medical history significant for CAD s/p CABG, paroxysmal SVT on amiodarone, frequent PVCs, LBBB, T2DM, HTN, HLD, hypothyroidism, depression,  BPH who is admitted with acute hypoxic respiratory failure due to malignant appearing left upper lobe mass and pleural effusion.  Acute respiratory failure with hypoxia due to left upper lobe/perihilar lung mass concerning for primary bronchogenic neoplasm with likely malignant left pleural effusion: New finding on CT chest.  Discussed high suspicion for primary lung cancer with patient.  He is agreeable to attempt thoracentesis for diagnostic/therapeutic purposes. -US thoracentesis with labs including cytology ordered for a.m. -Continue supplemental oxygen as needed  Acute kidney injury: Suspect prerenal due to reported recent GI losses with emesis and diarrhea.  Does appear overall volume depleted.  No recent baseline renal function available.  Will place on gentle IV fluids with NS'@50'$  mL/hour x10 hours.  Repeat labs in AM.  Hypercalcemia: In setting of suspected malignancy.  Started on gentle IV fluids as above.  CAD s/p CABG: Stable, denies any chest pain.  Continue statin.  Not currently on antiplatelets.  Paroxysmal SVT and frequent PVCs: Stable, holding amiodarone for now given acute pulmonary issues.  Rheumatoid arthritis: Hold home methotrexate for now.  Type 2 diabetes: Hold home metformin, place on sensitive SSI.  Hypertension: BP currently stable, holding home meds for now and resume as as indicated.  Hold ACE inhibitor.  Hypothyroidism: Continue Synthroid.  Hyperlipidemia: Continue rosuvastatin.  Depression: Continue Zoloft.  DVT prophylaxis: Lovenox Code Status: DNR, confirmed with patient Family Communication: Discussed with patient, he has discussed with family Disposition Plan: From home, dispo pending clinical progress Consults  called: None Level of care: Telemetry Medical Admission status:  Status is: Observation  The patient remains OBS appropriate and will d/c before 2 midnights.  Dispo: The patient is from: Home              Anticipated d/c is to: Home              Patient currently is not medically stable to d/c.   Zada Finders MD [facility]  If 7PM-7AM, please contact night-coverage [email or web address]  08/13/2021, 9:52 PM

## 2021-08-13 NOTE — ED Triage Notes (Signed)
Patient complains of shortness of breath that started a few weeks ago. Denies pain. States the shortness of breath has not gotten worse over the last few weeks, he has come to the ED because it still persists. Patient alert, oriented, speaking in complete sentences, and in no apparent distress at this time.

## 2021-08-13 NOTE — ED Provider Notes (Signed)
Emergency Medicine Provider Triage Evaluation Note  Seth Simpson , a 87 y.o. male  was evaluated in triage.  Pt complains of shortness of breath for the past few weeks.  Patient admits to shortness of breath only with exertion.  No shortness of breath at rest.  Denies associated chest pain.  No history of blood clots.  Denies lower extremity edema.  No history of COPD.  Patient was sent to the ED by PCP due to persistent shortness of breath.  Review of Systems  Positive: SOB Negative: CP  Physical Exam  BP 102/63 (BP Location: Right Arm)   Pulse 79   Temp 97.7 F (36.5 C) (Oral)   Resp 16   SpO2 93%  Gen:   Awake, no distress   Resp:  Normal effort  MSK:   Moves extremities without difficulty  Other:  Pitting edema bilaterally  Medical Decision Making  Medically screening exam initiated at 12:27 PM.  Appropriate orders placed.  Seth Simpson was informed that the remainder of the evaluation will be completed by another provider, this initial triage assessment does not replace that evaluation, and the importance of remaining in the ED until their evaluation is complete.  Labs CXR EKG   Karie Kirks 08/13/21 1229    Carmin Muskrat, MD 08/13/21 1705

## 2021-08-14 ENCOUNTER — Other Ambulatory Visit: Payer: Self-pay

## 2021-08-14 ENCOUNTER — Observation Stay (HOSPITAL_COMMUNITY): Payer: Medicare Other

## 2021-08-14 DIAGNOSIS — E860 Dehydration: Secondary | ICD-10-CM | POA: Diagnosis present

## 2021-08-14 DIAGNOSIS — M069 Rheumatoid arthritis, unspecified: Secondary | ICD-10-CM | POA: Diagnosis present

## 2021-08-14 DIAGNOSIS — N323 Diverticulum of bladder: Secondary | ICD-10-CM | POA: Diagnosis not present

## 2021-08-14 DIAGNOSIS — E7800 Pure hypercholesterolemia, unspecified: Secondary | ICD-10-CM | POA: Diagnosis not present

## 2021-08-14 DIAGNOSIS — R0602 Shortness of breath: Secondary | ICD-10-CM | POA: Diagnosis present

## 2021-08-14 DIAGNOSIS — F32A Depression, unspecified: Secondary | ICD-10-CM | POA: Diagnosis not present

## 2021-08-14 DIAGNOSIS — I10 Essential (primary) hypertension: Secondary | ICD-10-CM | POA: Diagnosis not present

## 2021-08-14 DIAGNOSIS — K449 Diaphragmatic hernia without obstruction or gangrene: Secondary | ICD-10-CM | POA: Diagnosis not present

## 2021-08-14 DIAGNOSIS — Z20822 Contact with and (suspected) exposure to covid-19: Secondary | ICD-10-CM | POA: Diagnosis not present

## 2021-08-14 DIAGNOSIS — I471 Supraventricular tachycardia: Secondary | ICD-10-CM

## 2021-08-14 DIAGNOSIS — Z515 Encounter for palliative care: Secondary | ICD-10-CM | POA: Diagnosis not present

## 2021-08-14 DIAGNOSIS — R590 Localized enlarged lymph nodes: Secondary | ICD-10-CM | POA: Diagnosis not present

## 2021-08-14 DIAGNOSIS — Z7189 Other specified counseling: Secondary | ICD-10-CM | POA: Diagnosis not present

## 2021-08-14 DIAGNOSIS — D509 Iron deficiency anemia, unspecified: Secondary | ICD-10-CM | POA: Diagnosis present

## 2021-08-14 DIAGNOSIS — I495 Sick sinus syndrome: Secondary | ICD-10-CM | POA: Diagnosis not present

## 2021-08-14 DIAGNOSIS — N281 Cyst of kidney, acquired: Secondary | ICD-10-CM | POA: Diagnosis not present

## 2021-08-14 DIAGNOSIS — J439 Emphysema, unspecified: Secondary | ICD-10-CM | POA: Diagnosis present

## 2021-08-14 DIAGNOSIS — I350 Nonrheumatic aortic (valve) stenosis: Secondary | ICD-10-CM | POA: Diagnosis present

## 2021-08-14 DIAGNOSIS — I2510 Atherosclerotic heart disease of native coronary artery without angina pectoris: Secondary | ICD-10-CM

## 2021-08-14 DIAGNOSIS — I517 Cardiomegaly: Secondary | ICD-10-CM | POA: Diagnosis not present

## 2021-08-14 DIAGNOSIS — N3289 Other specified disorders of bladder: Secondary | ICD-10-CM | POA: Diagnosis not present

## 2021-08-14 DIAGNOSIS — J90 Pleural effusion, not elsewhere classified: Secondary | ICD-10-CM | POA: Diagnosis not present

## 2021-08-14 DIAGNOSIS — N179 Acute kidney failure, unspecified: Secondary | ICD-10-CM | POA: Diagnosis not present

## 2021-08-14 DIAGNOSIS — E119 Type 2 diabetes mellitus without complications: Secondary | ICD-10-CM

## 2021-08-14 DIAGNOSIS — Z66 Do not resuscitate: Secondary | ICD-10-CM | POA: Diagnosis present

## 2021-08-14 DIAGNOSIS — E872 Acidosis: Secondary | ICD-10-CM | POA: Diagnosis not present

## 2021-08-14 DIAGNOSIS — J9601 Acute respiratory failure with hypoxia: Secondary | ICD-10-CM

## 2021-08-14 DIAGNOSIS — I509 Heart failure, unspecified: Secondary | ICD-10-CM | POA: Diagnosis not present

## 2021-08-14 DIAGNOSIS — K219 Gastro-esophageal reflux disease without esophagitis: Secondary | ICD-10-CM | POA: Diagnosis not present

## 2021-08-14 DIAGNOSIS — F419 Anxiety disorder, unspecified: Secondary | ICD-10-CM | POA: Diagnosis present

## 2021-08-14 DIAGNOSIS — D72829 Elevated white blood cell count, unspecified: Secondary | ICD-10-CM | POA: Diagnosis present

## 2021-08-14 DIAGNOSIS — C8332 Diffuse large B-cell lymphoma, intrathoracic lymph nodes: Secondary | ICD-10-CM | POA: Diagnosis not present

## 2021-08-14 DIAGNOSIS — E039 Hypothyroidism, unspecified: Secondary | ICD-10-CM | POA: Diagnosis present

## 2021-08-14 DIAGNOSIS — J9811 Atelectasis: Secondary | ICD-10-CM | POA: Diagnosis present

## 2021-08-14 DIAGNOSIS — R918 Other nonspecific abnormal finding of lung field: Secondary | ICD-10-CM

## 2021-08-14 DIAGNOSIS — R0902 Hypoxemia: Secondary | ICD-10-CM | POA: Diagnosis not present

## 2021-08-14 HISTORY — PX: IR THORACENTESIS ASP PLEURAL SPACE W/IMG GUIDE: IMG5380

## 2021-08-14 LAB — CBC
HCT: 36.6 % — ABNORMAL LOW (ref 39.0–52.0)
Hemoglobin: 11.8 g/dL — ABNORMAL LOW (ref 13.0–17.0)
MCH: 35.4 pg — ABNORMAL HIGH (ref 26.0–34.0)
MCHC: 32.2 g/dL (ref 30.0–36.0)
MCV: 109.9 fL — ABNORMAL HIGH (ref 80.0–100.0)
Platelets: 213 10*3/uL (ref 150–400)
RBC: 3.33 MIL/uL — ABNORMAL LOW (ref 4.22–5.81)
RDW: 14.7 % (ref 11.5–15.5)
WBC: 9.4 10*3/uL (ref 4.0–10.5)
nRBC: 0 % (ref 0.0–0.2)

## 2021-08-14 LAB — URINALYSIS, COMPLETE (UACMP) WITH MICROSCOPIC
Bacteria, UA: NONE SEEN
Bilirubin Urine: NEGATIVE
Glucose, UA: NEGATIVE mg/dL
Ketones, ur: NEGATIVE mg/dL
Leukocytes,Ua: NEGATIVE
Nitrite: NEGATIVE
Protein, ur: NEGATIVE mg/dL
Specific Gravity, Urine: 1.02 (ref 1.005–1.030)
pH: 5 (ref 5.0–8.0)

## 2021-08-14 LAB — COMPREHENSIVE METABOLIC PANEL
ALT: 27 U/L (ref 0–44)
AST: 31 U/L (ref 15–41)
Albumin: 2.6 g/dL — ABNORMAL LOW (ref 3.5–5.0)
Alkaline Phosphatase: 85 U/L (ref 38–126)
Anion gap: 6 (ref 5–15)
BUN: 26 mg/dL — ABNORMAL HIGH (ref 8–23)
CO2: 19 mmol/L — ABNORMAL LOW (ref 22–32)
Calcium: 10.9 mg/dL — ABNORMAL HIGH (ref 8.9–10.3)
Chloride: 111 mmol/L (ref 98–111)
Creatinine, Ser: 1.62 mg/dL — ABNORMAL HIGH (ref 0.61–1.24)
GFR, Estimated: 41 mL/min — ABNORMAL LOW (ref >60)
Glucose, Bld: 107 mg/dL — ABNORMAL HIGH (ref 70–99)
Potassium: 4.3 mmol/L (ref 3.5–5.1)
Sodium: 136 mmol/L (ref 135–145)
Total Bilirubin: 0.9 mg/dL (ref 0.3–1.2)
Total Protein: 5.5 g/dL — ABNORMAL LOW (ref 6.5–8.1)

## 2021-08-14 LAB — CBG MONITORING, ED: Glucose-Capillary: 113 mg/dL — ABNORMAL HIGH (ref 70–99)

## 2021-08-14 LAB — BODY FLUID CELL COUNT WITH DIFFERENTIAL
Eos, Fluid: 10 %
Lymphs, Fluid: 79 %
Monocyte-Macrophage-Serous Fluid: 9 % — ABNORMAL LOW (ref 50–90)
Neutrophil Count, Fluid: 2 % (ref 0–25)
Total Nucleated Cell Count, Fluid: 2280 cu mm — ABNORMAL HIGH (ref 0–1000)

## 2021-08-14 LAB — GLUCOSE, CAPILLARY
Glucose-Capillary: 117 mg/dL — ABNORMAL HIGH (ref 70–99)
Glucose-Capillary: 160 mg/dL — ABNORMAL HIGH (ref 70–99)

## 2021-08-14 LAB — HEMOGLOBIN A1C
Hgb A1c MFr Bld: 7.2 % — ABNORMAL HIGH (ref 4.8–5.6)
Mean Plasma Glucose: 159.94 mg/dL

## 2021-08-14 LAB — GLUCOSE, PLEURAL OR PERITONEAL FLUID: Glucose, Fluid: 96 mg/dL

## 2021-08-14 LAB — PROTEIN, PLEURAL OR PERITONEAL FLUID: Total protein, fluid: 3.3 g/dL

## 2021-08-14 LAB — LACTATE DEHYDROGENASE: LDH: 195 U/L — ABNORMAL HIGH (ref 98–192)

## 2021-08-14 LAB — SODIUM, URINE, RANDOM: Sodium, Ur: 60 mmol/L

## 2021-08-14 LAB — LACTATE DEHYDROGENASE, PLEURAL OR PERITONEAL FLUID: LD, Fluid: 341 U/L — ABNORMAL HIGH (ref 3–23)

## 2021-08-14 LAB — CREATININE, URINE, RANDOM: Creatinine, Urine: 98.33 mg/dL

## 2021-08-14 MED ORDER — SODIUM CHLORIDE (HYPERTONIC) 5 % OP SOLN
1.0000 [drp] | Freq: Four times a day (QID) | OPHTHALMIC | Status: DC
  Administered 2021-08-14 – 2021-08-30 (×59): 1 [drp] via OPHTHALMIC
  Filled 2021-08-14: qty 15

## 2021-08-14 MED ORDER — SODIUM CHLORIDE (HYPERTONIC) 5 % OP OINT
1.0000 "application " | TOPICAL_OINTMENT | Freq: Every day | OPHTHALMIC | Status: DC
  Filled 2021-08-14 (×2): qty 3.5

## 2021-08-14 MED ORDER — SODIUM CHLORIDE 0.9 % IV SOLN: INTRAVENOUS | Status: DC

## 2021-08-14 MED ORDER — IPRATROPIUM-ALBUTEROL 0.5-2.5 (3) MG/3ML IN SOLN
3.0000 mL | Freq: Three times a day (TID) | RESPIRATORY_TRACT | Status: DC
  Administered 2021-08-14 – 2021-08-16 (×3): 3 mL via RESPIRATORY_TRACT
  Filled 2021-08-14 (×3): qty 3

## 2021-08-14 MED ORDER — LORATADINE 10 MG PO TABS
10.0000 mg | ORAL_TABLET | Freq: Every morning | ORAL | Status: DC
  Administered 2021-08-15 – 2021-08-30 (×15): 10 mg via ORAL
  Filled 2021-08-14 (×15): qty 1

## 2021-08-14 MED ORDER — METOPROLOL SUCCINATE ER 25 MG PO TB24
25.0000 mg | ORAL_TABLET | Freq: Every day | ORAL | Status: DC
  Administered 2021-08-14 – 2021-08-30 (×17): 25 mg via ORAL
  Filled 2021-08-14 (×17): qty 1

## 2021-08-14 MED ORDER — ARTIFICIAL TEARS OPHTHALMIC OINT
1.0000 "application " | TOPICAL_OINTMENT | Freq: Every day | OPHTHALMIC | Status: DC
  Administered 2021-08-15 – 2021-08-18 (×5): 1 via OPHTHALMIC
  Filled 2021-08-14 (×2): qty 3.5

## 2021-08-14 MED ORDER — LIDOCAINE HCL (PF) 1 % IJ SOLN
INTRAMUSCULAR | Status: DC | PRN
  Administered 2021-08-14: 10 mL

## 2021-08-14 MED ORDER — FERROUS SULFATE 325 (65 FE) MG PO TABS
325.0000 mg | ORAL_TABLET | ORAL | Status: DC
  Administered 2021-08-14 – 2021-08-26 (×7): 325 mg via ORAL
  Filled 2021-08-14 (×11): qty 1

## 2021-08-14 MED ORDER — FOLIC ACID 1 MG PO TABS
3.0000 mg | ORAL_TABLET | Freq: Every morning | ORAL | Status: DC
  Administered 2021-08-15 – 2021-08-30 (×15): 3 mg via ORAL
  Filled 2021-08-14 (×15): qty 3

## 2021-08-14 MED ORDER — LIDOCAINE HCL 1 % IJ SOLN
INTRAMUSCULAR | Status: AC
  Filled 2021-08-14: qty 20

## 2021-08-14 MED ORDER — ACETAMINOPHEN 500 MG PO TABS: 500.0000 mg | ORAL_TABLET | Freq: Four times a day (QID) | ORAL | Status: DC | PRN

## 2021-08-14 MED ORDER — ALLOPURINOL 300 MG PO TABS
300.0000 mg | ORAL_TABLET | Freq: Every evening | ORAL | Status: DC
  Administered 2021-08-14 – 2021-08-29 (×15): 300 mg via ORAL
  Filled 2021-08-14 (×16): qty 1

## 2021-08-14 NOTE — Progress Notes (Addendum)
PROGRESS NOTE    Seth Simpson  [id number] DOB: Sep 19, 1931 DOA: 08/13/2021 PCP: Josetta Huddle, MD    No chief complaint on file.   Brief Narrative:  Patient is a 87 year old gentleman history of coronary artery disease status post CABG, paroxysmal SVT on amiodarone, frequent PVCs, LBBB, type 2 diabetes, hypertension, hyperlipidemia, hypothyroidism, depression, BPH presented to the ED with concerns for dyspnea.  Work-up done including CT chest concerning for left upper lobe mass and moderate left-sided pleural effusion concerning for malignancy.  Patient admitted diagnostic and therapeutic thoracentesis ordered and currently pending.  PCCM consulted.   Assessment & Plan:   Principal Problem:   Acute respiratory failure with hypoxia (HCC) Active Problems:   Coronary artery disease   Hypertension   Hypercholesteremia   PSVT (paroxysmal supraventricular tachycardia) (HCC)   Type II diabetes mellitus (HCC)   Mass of upper lobe of left lung   Pleural effusion on left   #1 acute respiratory failure with hypoxia secondary to left upper lobe/perihilar lung mass concerning for primary bronchogenic neoplasm and likely left malignant pleural effusion -Patient presented with dyspnea. -Chest x-ray done on admission with left base collapse/consolidation with left suprahilar fullness and moderate left effusion concerning for neoplasm. -CT chest done with 4.0 cm medial left upper lobe/perihilar mass, extending into the left hilum compatible with primary bronchogenic neoplasm.  Associated thoracic nodal mets.  Postobstructive opacity/atelectasis in the lingula and left lower lobe.  Moderate left pleural effusion favored to be malignant.  Emphysema.  Aortic atherosclerosis. -Patient afebrile.   -Leukocytosis trending down. -Patient awaiting diagnostic and therapeutic ultrasound-guided thoracentesis pending. -Pulmonary consulted and following, if thoracentesis and diagnostic patient may need  diagnostic bronchoscopy. -Placed on scheduled duo nebs. -Supportive care.     2.  Acute kidney injury -Felt likely secondary to prerenal azotemia due to recent GI losses of emesis and diarrhea in the setting of ACE inhibitor. -Last creatinine noted on epic was 1.00 on 07/26/2017. -Check a UA with cultures and sensitivities, check a urine sodium, urine creatinine. -Creatinine slowly trending down with hydration -Continue gentle hydration for another 24 hours. -Hold ACE inhibitor. -Follow.  3.  Hypercalcemia -In the setting of suspected malignancy. -Patient also noted to be on oral vitamin D which will not resume during the hospitalization. -Calcium levels trending down with hydration. -Continue IV fluids. -May need a dose of Zometa however will follow for now.  4.  Coronary artery disease status post CABG -Continue statin.  5.  Paroxysmal SVTs and frequent PVCs -Amiodarone hold due to acute pulmonary issues. -May consider resumption of amiodarone in the next 1 to 2 days if pulmonary status remains stable or improves after thoracentesis. -Resume home regimen Toprol-XL  6.  Rheumatoid arthritis -Continue to hold methotrexate. -Outpatient follow-up.  7.  Diabetes mellitus type 2 -Hemoglobin A1c 7.2. -Hold oral hypoglycemic agents. -Sliding scale insulin.  8.  Hypothyroidism -Continue home dose Synthroid.  9.  Depression -Zoloft.  10.  Hypertension -BP stable.  Continue to hold antihypertensive medications. -Could likely start resuming home regimen of antihypertensive medications in the next 24 hours or once blood pressure starts to increase. -ACE inhibitor on hold. -Resume Toprol-XL.  11.  Hyperlipidemia -Statin.    DVT prophylaxis: Lovenox. Code Status: DNR Family Communication: Updated patient at bedside.  No family present Disposition:   Status is: Inpatient  The patient will require care spanning > 2 midnights and should be moved to inpatient because:  Inpatient level of care appropriate due to severity of illness  Dispo: The patient is from: Home              Anticipated d/c is to: Home              Patient currently is not medically stable to d/c.   Difficult to place patient No       Consultants:  PCCM: Dr. Erin Fulling 08/14/2021  Procedures: CT chest 08/13/2021 Chest x-ray 08/13/2021, 08/14/2021 Ultrasound-guided diagnostic and therapeutic thoracentesis pending per IR 08/14/2021   Antimicrobials:  None   Subjective: In room about to go downstairs for thoracentesis.  Patient denies any chest pain.  Feels some improvement with shortness of breath since admission.  No abdominal pain.  Objective: Vitals:   08/14/21 0915 08/14/21 0930 08/14/21 0945 08/14/21 1039  BP: 122/65 124/63 121/70 (!) 143/67  Pulse: 69 68 69 69  Resp: '13 13 14 19  '$ Temp:   98 F (36.7 C) 98.2 F (36.8 C)  TempSrc:    Oral  SpO2: 93% 94% 93% 92%  Weight:       No intake or output data in the 24 hours ending 08/14/21 1107 Filed Weights   08/13/21 2200  Weight: 65.8 kg    Examination:  General exam: Appears calm and comfortable  Respiratory system: Decreased breath sounds in the left base.  No wheezing.  Fair air movement.  No crackles.  Speaking in full sentences. Cardiovascular system: RRR with 3/6 systolic ejection murmur.  No lower extremity edema.  Gastrointestinal system: Abdomen is nondistended, soft and nontender. No organomegaly or masses felt. Normal bowel sounds heard. Central nervous system: Alert and oriented. No focal neurological deficits. Extremities: Symmetric 5 x 5 power. Skin: No rashes, lesions or ulcers Psychiatry: Judgement and insight appear normal. Mood & affect appropriate.     Data Reviewed: I have personally reviewed following labs and imaging studies  CBC: Recent Labs  Lab 08/13/21 1227 08/14/21 0322  WBC 11.0* 9.4  NEUTROABS 8.8*  --   HGB 12.9* 11.8*  HCT 39.8 36.6*  MCV 109.6* 109.9*  PLT 238 213    Basic  Metabolic Panel: Recent Labs  Lab 08/13/21 1227 08/14/21 0322  NA 137 136  K 4.3 4.3  CL 110 111  CO2 18* 19*  GLUCOSE 145* 107*  BUN 29* 26*  CREATININE 1.78* 1.62*  CALCIUM 11.5* 10.9*    GFR: Estimated Creatinine Clearance: 29.3 mL/min (A) (by C-G formula based on SCr of 1.62 mg/dL (H)).  Liver Function Tests: Recent Labs  Lab 08/14/21 0322  AST 31  ALT 27  ALKPHOS 85  BILITOT 0.9  PROT 5.5*  ALBUMIN 2.6*    CBG: Recent Labs  Lab 08/14/21 0854  GLUCAP 113*     Recent Results (from the past 240 hour(s))  Resp Panel by RT-PCR (Flu A&B, Covid) Nasopharyngeal Swab     Status: None   Collection Time: 08/13/21  4:36 PM   Specimen: Nasopharyngeal Swab; Nasopharyngeal(NP) swabs in vial transport medium  Result Value Ref Range Status   SARS Coronavirus 2 by RT PCR NEGATIVE NEGATIVE Final    Comment: (NOTE) SARS-CoV-2 target nucleic acids are NOT DETECTED.  The SARS-CoV-2 RNA is generally detectable in upper respiratory specimens during the acute phase of infection. The lowest concentration of SARS-CoV-2 viral copies this assay can detect is 138 copies/mL. A negative result does not preclude SARS-Cov-2 infection and should not be used as the sole basis for treatment or other patient management decisions. A negative result may occur with  improper  specimen collection/handling, submission of specimen other than nasopharyngeal swab, presence of viral mutation(s) within the areas targeted by this assay, and inadequate number of viral copies(<138 copies/mL). A negative result must be combined with clinical observations, patient history, and epidemiological information. The expected result is Negative.  Fact Sheet for Patients:  EntrepreneurPulse.com.au  Fact Sheet for Healthcare Providers:  IncredibleEmployment.be  This test is no t yet approved or cleared by the [place] FDA and  has been authorized for detection and/or  diagnosis of SARS-CoV-2 by FDA under an Emergency Use Authorization (EUA). This EUA will remain  in effect (meaning this test can be used) for the duration of the COVID-19 declaration under Section 564(b)(1) of the Act, 21 U.S.C.section 360bbb-3(b)(1), unless the authorization is terminated  or revoked sooner.       Influenza A by PCR NEGATIVE NEGATIVE Final   Influenza B by PCR NEGATIVE NEGATIVE Final    Comment: (NOTE) The Xpert Xpress SARS-CoV-2/FLU/RSV plus assay is intended as an aid in the diagnosis of influenza from Nasopharyngeal swab specimens and should not be used as a sole basis for treatment. Nasal washings and aspirates are unacceptable for Xpert Xpress SARS-CoV-2/FLU/RSV testing.  Fact Sheet for Patients: EntrepreneurPulse.com.au  Fact Sheet for Healthcare Providers: IncredibleEmployment.be  This test is not yet approved or cleared by the [place] FDA and has been authorized for detection and/or diagnosis of SARS-CoV-2 by FDA under an Emergency Use Authorization (EUA). This EUA will remain in effect (meaning this test can be used) for the duration of the COVID-19 declaration under Section 564(b)(1) of the Act, 21 U.S.C. section 360bbb-3(b)(1), unless the authorization is terminated or revoked.  Performed at [facility], [place] [address] [place], [place] [address]          Radiology Studies: DG Chest 1 View  Result Date: 08/13/2021 CLINICAL DATA:  Shortness of breath. EXAM: CHEST  1 VIEW COMPARISON:  11/28/2018 FINDINGS: The cardio pericardial silhouette is enlarged. Interstitial markings are diffusely coarsened with chronic features. Right base collapse/consolidation noted with moderate right pleural effusion. Soft tissue fullness noted left suprahilar region. Patchy airspace disease noted right base. Bones are diffusely demineralized. Status post CABG. IMPRESSION: Left base collapse/consolidation with left  suprahilar fullness and moderate left effusion. Given asymmetry of findings, underlying neoplasm a concern. Chest CT with contrast recommended to further evaluate. Electronically Signed   By: Misty Stanley M.D.   On: 08/13/2021 13:00   CT Chest W Contrast  Result Date: 08/13/2021 CLINICAL DATA:  Shortness of breath EXAM: CT CHEST WITH CONTRAST TECHNIQUE: Multidetector CT imaging of the chest was performed during intravenous contrast administration. CONTRAST:  30m OMNIPAQUE IOHEXOL 350 MG/ML SOLN COMPARISON:  Chest radiograph dated 08/13/2021 FINDINGS: Cardiovascular: The heart is normal in size. No pericardial effusion. No evidence of thoracic aortic aneurysm. Atherosclerotic calcifications of the aortic arch. Coronary atherosclerosis of the LAD and right coronary artery. Postsurgical changes related to prior CABG. Mediastinum/Nodes: Thoracic lymphadenopathy, including: --16 mm short axis node at the right thoracic inlet (series 3/image 26) --19 mm short axis AP window node (series 3/image 59) --18 mm short axis right paratracheal node (series 3/image 62) --20 mm short axis right hilar node (series 3/image 79) --23 mm short axis subcarinal node (series 3/image 82) --22 mm short axis left perihilar node (series 3/image 87) Visualized thyroid is unremarkable. Lungs/Pleura: 4.0 x 2.4 cm mass in the medial left upper lobe/perihilar region (series 3/image 67). Additional tumor along the left mediastinum (series 3/image 56) and  extending to the left hilum. Associated lingular and left lower lobe postobstructive opacity/atelectasis, underlying tumor not excluded. Moderate left pleural effusion. Some degree of pleural-based nodularity anteriorly (series 3/image 105) and along the posteromedial base (series 3/image 129) suggests a malignant pleural effusion. Mild centrilobular and paraseptal emphysematous changes. Biapical pleural-parenchymal scarring. Mild subpleural reticulation/fibrosis in the right lower lobe. Trace  right pleural fluid. No pneumothorax. Upper Abdomen: Visualized upper abdomen is notable for bilateral renal cysts and vascular calcifications. Musculoskeletal: Degenerative changes of the visualized thoracolumbar spine. Median sternotomy. IMPRESSION: 4.0 cm medial left upper lobe/perihilar mass, extending into the left hilum, compatible with primary bronchogenic neoplasm. Associated thoracic nodal metastases. Postobstructive opacity/atelectasis in the lingula and left lower lobe. Moderate left pleural effusion, favored to be malignant. Aortic Atherosclerosis (ICD10-I70.0) and Emphysema (ICD10-J43.9). Electronically Signed   By: Julian Hy M.D.   On: 08/13/2021 20:10        Scheduled Meds:  enoxaparin (LOVENOX) injection  30 mg Subcutaneous Q24H   finasteride  5 mg Oral Daily   insulin aspart  0-9 Units Subcutaneous TID WC   levothyroxine  50 mcg Oral Q0600   lidocaine       rosuvastatin  10 mg Oral Q M,W,F   sertraline  50 mg Oral q morning   sodium chloride flush  3 mL Intravenous Q12H   Continuous Infusions:  sodium chloride 75 mL/hr at 08/14/21 0953     LOS: 0 days    Time spent: 35 minutes    Irine Seal, MD [facility]   To contact the attending provider between 7A-7P or the covering provider during after hours 7P-7A, please log into the web site [email or web address] and access using universal [employer] password for that web site. If you do not have the password, please call the hospital operator.  08/14/2021, 11:07 AM

## 2021-08-14 NOTE — Consult Note (Signed)
NAME:  Seth Simpson, MRN:  [id number], DOB:  19-May-1931, LOS: 0 ADMISSION DATE:  08/13/2021, CONSULTATION DATE:  08/14/21 REFERRING MD:  Irine Seal, MD CHIEF COMPLAINT:  Lung Mass, Pleural Effusion   History of Present Illness:  Seth Simpson is an 87 year old male with CAD s/p CABG, DMII, hypertension, and hyperlipidemia who presented to the ER on 08/13/21 with shortness of breath.   The dyspnea was progressive over the past 2 weeks. Initial chest radiograph showed left pleural effusion and left hilar fullness. CT chest with contrast was then performed which showed 4cm left upper lobe/perihilar mass extending into the left hilum. Moderate to large left pleural effusion. He underwent thoracentesis by IR 9/2 with removal of 1.3L of amber fluid.   He is a former smoker, he quit at age 43 and has a 12 pack year smoking history. 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 He denies any harmful dust or chemical exposures. Denies any asbestos exposures.  He reports his dyspnea is improved after the thoracentesis. He denies any cough or wheezing. Denies any hemoptysis. He has a 10lbs weight loss over recent months and his appetite has decreased. His daughter was at the bedside.  Pertinent  Medical History  Coronary Artery Disease GERD Hypertension Hyperlipidemia Rheumatoid arthritis Diabetes Mellitus Type II  Significant Hospital Events: Including procedures, antibiotic start and stop dates in addition to other pertinent events   Admitted 9/1 for dyspnea, CT chest notable for left hilar mass and left pleural effusion 9/2 Thoracentesis with IR  Interim History / Subjective:  N/a  Objective   Blood pressure (!) 143/67, pulse 69, temperature 98.2 F (36.8 C), temperature source Oral, resp. rate 19, weight 65.8 kg, SpO2 92 %.       No intake or output data in the 24 hours ending 08/14/21 1216 Filed Weights    08/13/21 2200  Weight: 65.8 kg    Examination: General: Elderly male, thin, no acute distress HENT: /AT, sclera anicteric, moist mucous membranes Lungs: clear to auscultation. No wheezing or rhonchi. Cardiovascular: rrr, systolic murmur Abdomen: soft, non-tender, non-distended, bowel sounds present Extremities: warm, trace edema Neuro: alert, moving all extremities, hard of hearing GU: deferred  Resolved Hospital Problem list     Assessment & Plan:  Acute Hypoxemic Respiratory Failure Left Pleural Effusion Left Hilar Mass Emphysema  - Continue supplemental oxygen as needed to maintain SpO2 90-92% - Will follow up pleural fluid cytology studies.  - If pleural fluid is non-diagnostic, then the patient is amenable to bronchoscopy with EBUS in order to get tissue diagnosis.   PCCM will continue to follow.  Labs   CBC: Recent Labs  Lab 08/13/21 1227 08/14/21 0322  WBC 11.0* 9.4  NEUTROABS 8.8*  --   HGB 12.9* 11.8*  HCT 39.8 36.6*  MCV 109.6* 109.9*  PLT 238 [id number]    Basic Metabolic Panel: Recent Labs  Lab 08/13/21 1227 08/14/21 0322  NA 137 136  K 4.3 4.3  CL 110 111  CO2 18* 19*  GLUCOSE 145* 107*  BUN 29* 26*  CREATININE 1.78* 1.62*  CALCIUM 11.5* 10.9*   GFR: Estimated Creatinine Clearance: 29.3 mL/min (A) (by C-G formula based on SCr of 1.62 mg/dL (H)). Recent Labs  Lab 08/13/21 1227 08/14/21 0322  WBC 11.0* 9.4    Liver Function Tests: Recent Labs  Lab 08/14/21 0322  AST 31  ALT  27  ALKPHOS 85  BILITOT 0.9  PROT 5.5*  ALBUMIN 2.6*   No results for input(s): LIPASE, AMYLASE in the last 168 hours. No results for input(s): AMMONIA in the last 168 hours.  ABG    Component Value Date/Time   PHART 7.380 04/11/2008 2044   PCO2ART 37.0 04/11/2008 2044   PO2ART 122.0 (H) 04/11/2008 2044   HCO3 21.8 04/11/2008 2044   TCO2 22 07/26/2017 0715   ACIDBASEDEF 3.0 (H) 04/11/2008 2044   O2SAT 99.0 04/11/2008 2044     Coagulation Profile: No  results for input(s): INR, PROTIME in the last 168 hours.  Cardiac Enzymes: No results for input(s): CKTOTAL, CKMB, CKMBINDEX, TROPONINI in the last 168 hours.  HbA1C: Hgb A1c MFr Bld  Date/Time Value Ref Range Status  08/14/2021 03:22 AM 7.2 (H) 4.8 - 5.6 % Final    Comment:    (NOTE) Pre diabetes:          5.7%-6.4%  Diabetes:              >6.4%  Glycemic control for   <7.0% adults with diabetes   04/11/2008 03:43 AM   Final   5.7 (NOTE)   The ADA recommends the following therapeutic goals for glycemic   control related to Hgb A1C measurement:   Goal of Therapy:   < 7.0% Hgb A1C   Action Suggested:  > 8.0% Hgb A1C   Ref:  Diabetes Care, 22, Suppl. 1, 1999    CBG: Recent Labs  Lab 08/14/21 0854  GLUCAP 113*    Review of Systems:   Review of Systems  Constitutional:  Positive for weight loss. Negative for chills, fever and malaise/fatigue.  HENT:  Negative for congestion, sinus pain and sore throat.   Eyes: Negative.   Respiratory:  Positive for shortness of breath. Negative for cough, hemoptysis, sputum production and wheezing.   Cardiovascular:  Negative for chest pain, palpitations, orthopnea, claudication and leg swelling.  Gastrointestinal:  Negative for abdominal pain, heartburn, nausea and vomiting.  Genitourinary: Negative.   Musculoskeletal:  Negative for joint pain and myalgias.  Skin:  Negative for rash.  Neurological:  Negative for weakness.  Endo/Heme/Allergies: Negative.   Psychiatric/Behavioral: Negative.      Past Medical History:  He,  has a past medical history of Bicuspid aortic valve, Bladder tumor, BPH (benign prostatic hypertrophy), Coronary artery disease (cardiologist-  dr Marlou Porch), Depression, Erectile dysfunction, GERD (gastroesophageal reflux disease), Gout, Heart murmur, Hiatal hernia, History of bladder stone, History of kidney stones, History of prostatitis, History of ST elevation myocardial infarction (STEMI), History of syncope,  Hyperlipidemia, Hypertension, Iron deficiency anemia, LAFB (left anterior fascicular block), Lower urinary tract symptoms (LUTS), Mild aortic stenosis, OA (osteoarthritis), PSVT (paroxysmal supraventricular tachycardia) ([place]), PVC's (premature ventricular contractions), Rheumatoid arthritis ([place]) (10/02/2013), S/P CABG x 5, Sigmoid diverticulosis, Type II diabetes mellitus ([place]), and Wears glasses.   Surgical History:   Past Surgical History:  Procedure Laterality Date   [place]  04/06/2012   Procedure: BALLOON DILATION;  Surgeon: Garlan Fair, MD;  Location: [facility];  Service: Endoscopy;  Laterality: N/A;   CARDIAC CATHETERIZATION  04-10-2008  dr Daneen Schick   3 vessel CAD   CATARACT EXTRACTION W/ INTRAOCULAR LENS  IMPLANT, BILATERAL Bilateral 2003   CHOLECYSTECTOMY OPEN  1978   COLONOSCOPY  04/06/2012   Procedure: COLONOSCOPY;  Surgeon: Garlan Fair, MD;  Location: [facility];  Service: Endoscopy;  Laterality: N/A;   CORONARY ARTERY BYPASS  GRAFT  04-11-2008   LIMA to RI,  SVG to D1,  SVG to LAD,  sequetial SVG to Acute Marginal and pRCA   CYSTOSCOPY W/ RETROGRADES Bilateral 07/26/2017   Procedure: CYSTOSCOPY WITH BILATERAL  RETROGRADE PYELOGRAM;  Surgeon: Irine Seal, MD;  Location: [facility];  Service: Urology;  Laterality: Bilateral;   CYSTOSCOPY WITH BIOPSY N/A 06/17/2016   Procedure: CYSTOSCOPY WITH BIOPSY;  Surgeon: Irine Seal, MD;  Location: [facility];  Service: Urology;  Laterality: N/A;   CYSTOSCOPY WITH BIOPSY N/A 07/26/2017   Procedure: urethral dilation CYSTOSCOPY WITH BIOPSY AND FULGURATION, removal of bladder stone;  Surgeon: Irine Seal, MD;  Location: [facility];  Service: Urology;  Laterality: N/A;   CYSTOSCOPY WITH LITHOLAPAXY N/A 06/17/2016   Procedure: CYSTOSCOPY WITH LITHOLAPAXY;  Surgeon: Irine Seal, MD;  Location: [facility];  Service: Urology;  Laterality: N/A;    CYSTOSCOPY WITH RETROGRADE PYELOGRAM, URETEROSCOPY AND STENT PLACEMENT  06-17-2016  dr Irine Seal   Cysto/  Bilateral RTG's/  Bladder bx and fulgeration bladder tumor's left & right lateral wall's/  Litholapaxy bladder stone/  left ureteroscopy/  Insertion bilateral double J stents   CYSTOSCOPY WITH RETROGRADE PYELOGRAM, URETEROSCOPY AND STENT PLACEMENT Bilateral 06/17/2016   Procedure: CYSTOSCOPY BILATERAL RETROGRADE,  LEFT URETEROSCOPY AND BILATERAL STENT PLACEMENT;  Surgeon: Irine Seal, MD;  Location: [facility];  Service: Urology;  Laterality: Bilateral;   CYSTOSCOPY WITH URETEROSCOPY AND STENT PLACEMENT Bilateral 07/01/2016   Procedure: BILATERAL URETEROSCOPY STONE EXTRACTIONS WITH POSSIBLE STENT PLACEMENT;  Surgeon: Irine Seal, MD;  Location: [facility];  Service: Urology;  Laterality: Bilateral;   ESOPHAGOGASTRODUODENOSCOPY  04/06/2012   Procedure: ESOPHAGOGASTRODUODENOSCOPY (EGD);  Surgeon: Garlan Fair, MD;  Location: [facility];  Service: Endoscopy;  Laterality: N/A;   FINGER SURGERY Left 2000's   "removed BB", 2nd digit   HOLMIUM LASER APPLICATION N/A 0000000   Procedure: HOLMIUM LASER APPLICATION;  Surgeon: Irine Seal, MD;  Location: [facility];  Service: Urology;  Laterality: N/A;   HOLMIUM LASER APPLICATION Bilateral [id number]   Procedure: HOLMIUM LASER APPLICATION;  Surgeon: Irine Seal, MD;  Location: [facility];  Service: Urology;  Laterality: Bilateral;   TOTAL KNEE ARTHROPLASTY Bilateral right 03-20-2008//  left 08-23-2007   TRANSTHORACIC ECHOCARDIOGRAM  12-09-2016 dr Marlou Porch   mild LVH,  ef 50-55%/  Bicuspid AV with stenosis, valve area 1.47 cm^2 (previous echo it was 1.67cm^2 on 04-23-2014), trivial regurg/  mild LAE/ mild MR/  moderate TR, PASP 46mHg/      Social History:   reports that he quit smoking about 56 years ago. His smoking use included cigarettes. He has a 15.00 pack-year smoking history. He has  never used smokeless tobacco. He reports current alcohol use of about 2.0 standard drinks per week. He reports that he does not use drugs.   Family History:  His family history includes Heart disease in his father.   Allergies Allergies  Allergen Reactions   Peanut Allergen Powder-Dnfp Anaphylaxis   Peanut-Containing Drug Products Anaphylaxis, Shortness Of Breath and Swelling    Peanuts... Anaphylaxis   Hydrochlorothiazide Other (See Comments)    Avoids , flares up Gout   Other Diarrhea    Splenda   Pistachio Nut (Diagnostic) Swelling and Other (See Comments)    gout   Codeine Itching and Rash   Fruit & Vegetable Daily [Nutritional Supplements] Rash    Papaya     Home Medications  Prior to Admission  medications   Medication Sig Start Date End Date Taking? Authorizing Provider  ACCU-CHEK AVIVA PLUS test strip  11/10/15  Yes [provider]  ACCU-CHEK SOFTCLIX LANCETS lancets USE TO TEST BLOOD SUGAR DAILY 08/15/15  Yes [provider]  acetaminophen (TYLENOL) 500 MG tablet Take 500 mg by mouth every 6 (six) hours as needed for mild pain, moderate pain, fever or headache.   Yes [provider]  allopurinol (ZYLOPRIM) 300 MG tablet Take 300 mg by mouth every evening.    Yes [provider]  amiodarone (PACERONE) 200 MG tablet Take 0.5 tablets (100 mg total) by mouth daily. 03/18/21  Yes Jerline Pain, MD  amLODipine-benazepril (LOTREL) 5-20 MG per capsule Take 1 capsule by mouth at bedtime.   Yes [provider]  Cholecalciferol (VITAMIN D3) 2000 units TABS Take 2,000 Units by mouth every evening.   Yes [provider]  colchicine 0.6 MG tablet Take 0.6 mg by mouth daily as needed (gout).    Yes [provider]  [employer] SOD CHLORIDE HYPERTONICITY 5 % ophthalmic ointment Place 1 application into both eyes at bedtime. 06/11/21  Yes [provider]  [employer] SODIUM CHLORIDE 5 % ophthalmic solution Place 1 drop into both eyes 4  (four) times daily. 06/11/21  Yes [provider]  diphenhydrAMINE (BENADRYL) 25 MG tablet Take 25 mg by mouth daily as needed for allergies. Reported on 06/19/2016   Yes [provider]  ferrous sulfate 325 (65 FE) MG tablet Take 325 mg by mouth every other day.   Yes [provider]  finasteride (PROSCAR) 5 MG tablet Take 5 mg by mouth daily.   Yes [provider]  Flaxseed, Linseed, (FLAXSEED OIL MAX STR) 1300 MG CAPS Take 1 capsule by mouth daily.   Yes [provider]  folic acid (FOLVITE) 1 MG tablet Take 3 mg by mouth every morning.   Yes [provider]  hydrALAZINE (APRESOLINE) 25 MG tablet Take 25 mg by mouth 2 (two) times daily. 01/17/21  Yes [provider]  levothyroxine (SYNTHROID) 50 MCG tablet Take 50 mcg by mouth daily.   Yes [provider]  loratadine (CLARITIN) 10 MG tablet Take 10 mg by mouth every morning.    Yes [provider]  metFORMIN (GLUCOPHAGE-XR) 500 MG 24 hr tablet Take 500 mg by mouth every evening. 06/05/21  Yes [provider]  methotrexate (RHEUMATREX) 2.5 MG tablet Take 12.5 mg by mouth every Friday. Take on Fridays.  Caution:Chemotherapy. Protect from light.   Yes [provider]  Multiple Vitamin (MULTIVITAMIN WITH MINERALS) TABS tablet Take 1 tablet by mouth every evening.    Yes [provider]  omeprazole (PRILOSEC OTC) 20 MG tablet Take 10 mg by mouth as needed.    Yes [provider]  rosuvastatin (CRESTOR) 10 MG tablet Take 10 mg by mouth every Monday, Wednesday, and Friday.    Yes [provider]  sertraline (ZOLOFT) 100 MG tablet Take 50 mg by mouth every morning.  10/05/14  Yes [provider]  silodosin (RAPAFLO) 8 MG CAPS capsule Take 8 mg by mouth daily. 05/31/21  Yes [provider]  terazosin (HYTRIN) 5 MG capsule Take 5 mg by mouth at bedtime.    Yes [provider]  metoprolol succinate (TOPROL-XL) 25  MG 24 hr tablet Take 1 tablet (25 mg total) by mouth daily. Patient not taking: Reported on 08/13/2021 11/27/18   Jerline Pain, MD     Critical  care time: n/a    Freda Jackson, MD [facility] Office: [phone]   See Amion for personal pager PCCM on call pager [phone] until 7pm. Please call Elink 7p-7a. [phone]

## 2021-08-14 NOTE — Procedures (Signed)
PROCEDURE SUMMARY:  Successful US guided diagnostic and therapeutic left thoracentesis. Yielded 1.3 liters of amber fluid. Pt tolerated procedure well. Procedure was stopped prior to removal of all fluid as this was patient's first thoracentesis.  No immediate complications.  Specimen was sent for labs. CXR ordered.  EBL < 5 mL  Docia Barrier PA-C 08/14/2021 11:50 AM

## 2021-08-14 NOTE — ED Notes (Signed)
Called 2W to give report. Room assignment changed to room 26. Asked to call back in a few minutes to allow nurse to get report on other patients.

## 2021-08-15 DIAGNOSIS — J90 Pleural effusion, not elsewhere classified: Secondary | ICD-10-CM | POA: Diagnosis not present

## 2021-08-15 DIAGNOSIS — I10 Essential (primary) hypertension: Secondary | ICD-10-CM | POA: Diagnosis not present

## 2021-08-15 DIAGNOSIS — I2510 Atherosclerotic heart disease of native coronary artery without angina pectoris: Secondary | ICD-10-CM | POA: Diagnosis not present

## 2021-08-15 DIAGNOSIS — R918 Other nonspecific abnormal finding of lung field: Secondary | ICD-10-CM | POA: Diagnosis not present

## 2021-08-15 DIAGNOSIS — J9601 Acute respiratory failure with hypoxia: Secondary | ICD-10-CM | POA: Diagnosis not present

## 2021-08-15 LAB — RENAL FUNCTION PANEL
Albumin: 2.3 g/dL — ABNORMAL LOW (ref 3.5–5.0)
Anion gap: 6 (ref 5–15)
BUN: 24 mg/dL — ABNORMAL HIGH (ref 8–23)
CO2: 18 mmol/L — ABNORMAL LOW (ref 22–32)
Calcium: 10.6 mg/dL — ABNORMAL HIGH (ref 8.9–10.3)
Chloride: 114 mmol/L — ABNORMAL HIGH (ref 98–111)
Creatinine, Ser: 1.62 mg/dL — ABNORMAL HIGH (ref 0.61–1.24)
GFR, Estimated: 41 mL/min — ABNORMAL LOW (ref >60)
Glucose, Bld: 124 mg/dL — ABNORMAL HIGH (ref 70–99)
Phosphorus: 2.6 mg/dL (ref 2.5–4.6)
Potassium: 4 mmol/L (ref 3.5–5.1)
Sodium: 138 mmol/L (ref 135–145)

## 2021-08-15 LAB — CBC
HCT: 35.1 % — ABNORMAL LOW (ref 39.0–52.0)
Hemoglobin: 11.4 g/dL — ABNORMAL LOW (ref 13.0–17.0)
MCH: 35.2 pg — ABNORMAL HIGH (ref 26.0–34.0)
MCHC: 32.5 g/dL (ref 30.0–36.0)
MCV: 108.3 fL — ABNORMAL HIGH (ref 80.0–100.0)
Platelets: 218 10*3/uL (ref 150–400)
RBC: 3.24 MIL/uL — ABNORMAL LOW (ref 4.22–5.81)
RDW: 14.9 % (ref 11.5–15.5)
WBC: 12.2 10*3/uL — ABNORMAL HIGH (ref 4.0–10.5)
nRBC: 0 % (ref 0.0–0.2)

## 2021-08-15 LAB — GLUCOSE, CAPILLARY
Glucose-Capillary: 110 mg/dL — ABNORMAL HIGH (ref 70–99)
Glucose-Capillary: 170 mg/dL — ABNORMAL HIGH (ref 70–99)
Glucose-Capillary: 133 mg/dL — ABNORMAL HIGH (ref 70–99)
Glucose-Capillary: 141 mg/dL — ABNORMAL HIGH (ref 70–99)

## 2021-08-15 MED ORDER — GUAIFENESIN ER 600 MG PO TB12
1200.0000 mg | ORAL_TABLET | Freq: Two times a day (BID) | ORAL | Status: DC
  Administered 2021-08-15 – 2021-08-27 (×24): 1200 mg via ORAL
  Filled 2021-08-15 (×25): qty 2

## 2021-08-15 MED ORDER — PANTOPRAZOLE SODIUM 40 MG PO TBEC
40.0000 mg | DELAYED_RELEASE_TABLET | Freq: Every day | ORAL | Status: DC
  Administered 2021-08-15 – 2021-08-30 (×15): 40 mg via ORAL
  Filled 2021-08-15 (×15): qty 1

## 2021-08-15 MED ORDER — MOMETASONE FURO-FORMOTEROL FUM 200-5 MCG/ACT IN AERO
2.0000 | INHALATION_SPRAY | Freq: Two times a day (BID) | RESPIRATORY_TRACT | Status: DC
  Administered 2021-08-16 – 2021-08-30 (×24): 2 via RESPIRATORY_TRACT
  Filled 2021-08-15 (×2): qty 8.8

## 2021-08-15 MED ORDER — SODIUM CHLORIDE 0.9 % IV SOLN: INTRAVENOUS | Status: AC

## 2021-08-15 MED ORDER — SODIUM BICARBONATE 650 MG PO TABS
650.0000 mg | ORAL_TABLET | Freq: Two times a day (BID) | ORAL | Status: DC
  Administered 2021-08-15 – 2021-08-16 (×3): 650 mg via ORAL
  Filled 2021-08-15 (×2): qty 1

## 2021-08-15 NOTE — Plan of Care (Signed)
  Problem: Nutrition: Goal: Adequate nutrition will be maintained Outcome: Progressing   Problem: Coping: Goal: Level of anxiety will decrease Outcome: Progressing   Problem: Elimination: Goal: Will not experience complications related to bowel motility Outcome: Progressing   Problem: Pain Managment: Goal: General experience of comfort will improve Outcome: Progressing   Problem: Safety: Goal: Ability to remain free from injury will improve Outcome: Progressing   

## 2021-08-15 NOTE — Progress Notes (Addendum)
PROGRESS NOTE    Seth Simpson  [id number] DOB: Feb 08, 1931 DOA: 08/13/2021 PCP: Josetta Huddle, MD    No chief complaint on file.   Brief Narrative:  Patient is a 87 year old gentleman history of coronary artery disease status post CABG, paroxysmal SVT on amiodarone, frequent PVCs, LBBB, type 2 diabetes, hypertension, hyperlipidemia, hypothyroidism, depression, BPH presented to the ED with concerns for dyspnea.  Work-up done including CT chest concerning for left upper lobe mass and moderate left-sided pleural effusion concerning for malignancy.  Patient admitted diagnostic and therapeutic thoracentesis ordered and currently pending.  PCCM consulted.   Assessment & Plan:   Principal Problem:   Acute respiratory failure with hypoxia (HCC) Active Problems:   Coronary artery disease   Hypertension   Hypercholesteremia   PSVT (paroxysmal supraventricular tachycardia) (HCC)   Type II diabetes mellitus (HCC)   Mass of upper lobe of left lung   Pleural effusion on left   #1 acute respiratory failure with hypoxia secondary to left upper lobe/perihilar lung mass concerning for primary bronchogenic neoplasm and likely left malignant pleural effusion -Patient presented with dyspnea. -Chest x-ray done on admission with left base collapse/consolidation with left suprahilar fullness and moderate left effusion concerning for neoplasm. -CT chest done with 4.0 cm medial left upper lobe/perihilar mass, extending into the left hilum compatible with primary bronchogenic neoplasm.  Associated thoracic nodal mets.  Postobstructive opacity/atelectasis in the lingula and left lower lobe.  Moderate left pleural effusion favored to be malignant.  Emphysema.  Aortic atherosclerosis. -Patient afebrile.   -Leukocytosis fluctuating.   -Status post ultrasound-guided diagnostic and therapeutic thoracentesis with mesothelial cells seen, cytology pending.  -Continue scheduled duo nebs. -Placed on Dulera,  Claritin, Protonix, Mucinex. -Incentive spirometry, flutter valve. -Pulmonary consulted and following, if thoracentesis and diagnostic patient may need diagnostic bronchoscopy. -Supportive care.     2.  Acute kidney injury -Felt likely secondary to prerenal azotemia due to recent GI losses of emesis and diarrhea in the setting of ACE inhibitor. -Last creatinine noted on epic was 1.00 on 07/26/2017. -Creatinine currently at 1.62.   -Urinalysis nitrite negative, leukocytes negative, negative for protein.  Urine sodium at 60.  Urine creatinine at 98.33.   -Continue to hold ACE inhibitor.   -Increase IV fluids 100 cc/h for the next 1 to 2 days.   -Monitor urine output.   -Follow.  3.  Hypercalcemia -In the setting of suspected malignancy. -Patient also noted to be on oral vitamin D which will not resume during the hospitalization. -Calcium levels trending down with hydration. -Continue IV fluids. -May need a dose of Zometa however will follow for now.  4.  Coronary artery disease status post CABG -Continue statin, Toprol-XL.  5.  Paroxysmal SVTs and frequent PVCs -Amiodarone on hold due to acute pulmonary issues. -May consider resumption of amiodarone in the next 1 to 2 days if pulmonary status remains stable or improves after thoracentesis. -Continue Toprol-XL.   6.  Rheumatoid arthritis -Continue to hold methotrexate. -Outpatient follow-up.  7.  Diabetes mellitus type 2 -Hemoglobin A1c 7.2. -CBG 110 this morning. -Hold oral hypoglycemic agents. -Sliding scale insulin.  8.  Hypothyroidism -Synthroid.  9.  Depression -Continue Zoloft.    10.  Hypertension -BP stable.  -Toprol-XL resumed.  -Continue to hold ACE inhibitor.  -Follow.  11.  Hyperlipidemia -Continue statin.      DVT prophylaxis: Lovenox. Code Status: DNR Family Communication: Updated patient, daughter, son-in-law at bedside.  Disposition:   Status is: Inpatient  The patient will  require care  spanning > 2 midnights and should be moved to inpatient because: Inpatient level of care appropriate due to severity of illness  Dispo: The patient is from: Home              Anticipated d/c is to: Home              Patient currently is not medically stable to d/c.   Difficult to place patient No       Consultants:  PCCM: Dr. Erin Fulling 08/14/2021  Procedures: CT chest 08/13/2021 Chest x-ray 08/13/2021, 08/14/2021 Ultrasound-guided diagnostic and therapeutic thoracentesis per IR 08/14/2021--1.3 L of amber fluid removed.   Antimicrobials:  None   Subjective: Patient laying in bed just had a bowel movement.  Feels shortness of breath is improved after thoracentesis yesterday.  No chest pain.  No abdominal pain.  Daughter and son-in-law at bedside.    Objective: Vitals:   08/14/21 0945 08/14/21 1039 08/14/21 2018 08/15/21 0402  BP: 121/70 (!) 143/67 (!) 102/56 137/78  Pulse: 69 69 69   Resp: '14 19 16 17  '$ Temp: 98 F (36.7 C) 98.2 F (36.8 C) 98.7 F (37.1 C) 98.7 F (37.1 C)  TempSrc:  Oral Oral   SpO2: 93% 92% 92%   Weight:        Intake/Output Summary (Last 24 hours) at 08/15/2021 1100 Last data filed at 08/14/2021 1730 Gross per 24 hour  Intake 668.75 ml  Output --  Net 668.75 ml   Filed Weights   08/13/21 2200  Weight: 65.8 kg    Examination:  General exam: NAD. Respiratory system: Improving decreased left basilar breath sounds.  No wheezing.  Fair air movement.  No rhonchi.  Speaking in full sentences.  Cardiovascular system: RRR with 3/6 SEM.  No lower extremity edema.   Gastrointestinal system: Abdomen is soft, nontender, nondistended, positive bowel sounds.  No rebound.  No guarding.  Central nervous system: Alert and oriented. No focal neurological deficits. Extremities: Symmetric 5 x 5 power. Skin: No rashes, lesions or ulcers Psychiatry: Judgement and insight appear normal. Mood & affect appropriate.     Data Reviewed: I have personally reviewed following  labs and imaging studies  CBC: Recent Labs  Lab 08/13/21 1227 08/14/21 0322 08/15/21 0740  WBC 11.0* 9.4 12.2*  NEUTROABS 8.8*  --   --   HGB 12.9* 11.8* 11.4*  HCT 39.8 36.6* 35.1*  MCV 109.6* 109.9* 108.3*  PLT 238 213 218     Basic Metabolic Panel: Recent Labs  Lab 08/13/21 1227 08/14/21 0322 08/15/21 0740  NA 137 136 138  K 4.3 4.3 4.0  CL 110 111 114*  CO2 18* 19* 18*  GLUCOSE 145* 107* 124*  BUN 29* 26* 24*  CREATININE 1.78* 1.62* 1.62*  CALCIUM 11.5* 10.9* 10.6*  PHOS  --   --  2.6     GFR: Estimated Creatinine Clearance: 29.3 mL/min (A) (by C-G formula based on SCr of 1.62 mg/dL (H)).  Liver Function Tests: Recent Labs  Lab 08/14/21 0322 08/15/21 0740  AST 31  --   ALT 27  --   ALKPHOS 85  --   BILITOT 0.9  --   PROT 5.5*  --   ALBUMIN 2.6* 2.3*     CBG: Recent Labs  Lab 08/14/21 0854 08/14/21 1220 08/14/21 1613 08/15/21 0744  GLUCAP 113* 117* 160* 110*      Recent Results (from the past 240 hour(s))  Resp Panel by RT-PCR (Flu A&B, Covid) Nasopharyngeal  Swab     Status: None   Collection Time: 08/13/21  4:36 PM   Specimen: Nasopharyngeal Swab; Nasopharyngeal(NP) swabs in vial transport medium  Result Value Ref Range Status   SARS Coronavirus 2 by RT PCR NEGATIVE NEGATIVE Final    Comment: (NOTE) SARS-CoV-2 target nucleic acids are NOT DETECTED.  The SARS-CoV-2 RNA is generally detectable in upper respiratory specimens during the acute phase of infection. The lowest concentration of SARS-CoV-2 viral copies this assay can detect is 138 copies/mL. A negative result does not preclude SARS-Cov-2 infection and should not be used as the sole basis for treatment or other patient management decisions. A negative result may occur with  improper specimen collection/handling, submission of specimen other than nasopharyngeal swab, presence of viral mutation(s) within the areas targeted by this assay, and inadequate number of  viral copies(<138 copies/mL). A negative result must be combined with clinical observations, patient history, and epidemiological information. The expected result is Negative.  Fact Sheet for Patients:  EntrepreneurPulse.com.au  Fact Sheet for Healthcare Providers:  IncredibleEmployment.be  This test is no t yet approved or cleared by the [place] FDA and  has been authorized for detection and/or diagnosis of SARS-CoV-2 by FDA under an Emergency Use Authorization (EUA). This EUA will remain  in effect (meaning this test can be used) for the duration of the COVID-19 declaration under Section 564(b)(1) of the Act, 21 U.S.C.section 360bbb-3(b)(1), unless the authorization is terminated  or revoked sooner.       Influenza A by PCR NEGATIVE NEGATIVE Final   Influenza B by PCR NEGATIVE NEGATIVE Final    Comment: (NOTE) The Xpert Xpress SARS-CoV-2/FLU/RSV plus assay is intended as an aid in the diagnosis of influenza from Nasopharyngeal swab specimens and should not be used as a sole basis for treatment. Nasal washings and aspirates are unacceptable for Xpert Xpress SARS-CoV-2/FLU/RSV testing.  Fact Sheet for Patients: EntrepreneurPulse.com.au  Fact Sheet for Healthcare Providers: IncredibleEmployment.be  This test is not yet approved or cleared by the [place] FDA and has been authorized for detection and/or diagnosis of SARS-CoV-2 by FDA under an Emergency Use Authorization (EUA). This EUA will remain in effect (meaning this test can be used) for the duration of the COVID-19 declaration under Section 564(b)(1) of the Act, 21 U.S.C. section 360bbb-3(b)(1), unless the authorization is terminated or revoked.  Performed at [facility], [place] [address] [place], [place] [address]   Body fluid culture w Gram Stain     Status: None (Preliminary result)   Collection Time: 08/14/21 11:59 AM    Specimen: Lung, Left; Pleural Fluid  Result Value Ref Range Status   Specimen Description FLUID  Final   Special Requests LUNG LEFT  Final   Gram Stain PENDING  Incomplete   Culture   Final    NO GROWTH < 24 HOURS Performed at [facility], [place] [address], [place], [place] [address]    Report Status PENDING  Incomplete          Radiology Studies: DG Chest 1 View  Result Date: 08/14/2021 CLINICAL DATA:  Status post left thoracentesis EXAM: CHEST  1 VIEW COMPARISON:  Chest radiograph 08/13/2021 FINDINGS: Median sternotomy wires and mediastinal surgical clips are stable. The cardiomediastinal silhouette is grossly stable. Perihilar opacities likely correspond to lymphadenopathy in the left perihilar mass seen on the prior CT. The left pleural effusion has significantly decreased in size following thoracentesis with improved aeration of the left lung. There is no appreciable  pneumothorax. Streaky opacities in the right base likely reflect atelectasis. The right lung is otherwise clear. There is no right pleural effusion or pneumothorax. IMPRESSION: Decreased left pleural effusion with improved aeration of the left lung following thoracentesis. No pneumothorax. Electronically Signed   By: Valetta Mole M.D.   On: 08/14/2021 12:11   DG Chest 1 View  Result Date: 08/13/2021 CLINICAL DATA:  Shortness of breath. EXAM: CHEST  1 VIEW COMPARISON:  11/28/2018 FINDINGS: The cardio pericardial silhouette is enlarged. Interstitial markings are diffusely coarsened with chronic features. Right base collapse/consolidation noted with moderate right pleural effusion. Soft tissue fullness noted left suprahilar region. Patchy airspace disease noted right base. Bones are diffusely demineralized. Status post CABG. IMPRESSION: Left base collapse/consolidation with left suprahilar fullness and moderate left effusion. Given asymmetry of findings, underlying neoplasm a concern. Chest CT with contrast recommended to  further evaluate. Electronically Signed   By: Misty Stanley M.D.   On: 08/13/2021 13:00   CT Chest W Contrast  Result Date: 08/13/2021 CLINICAL DATA:  Shortness of breath EXAM: CT CHEST WITH CONTRAST TECHNIQUE: Multidetector CT imaging of the chest was performed during intravenous contrast administration. CONTRAST:  72m OMNIPAQUE IOHEXOL 350 MG/ML SOLN COMPARISON:  Chest radiograph dated 08/13/2021 FINDINGS: Cardiovascular: The heart is normal in size. No pericardial effusion. No evidence of thoracic aortic aneurysm. Atherosclerotic calcifications of the aortic arch. Coronary atherosclerosis of the LAD and right coronary artery. Postsurgical changes related to prior CABG. Mediastinum/Nodes: Thoracic lymphadenopathy, including: --16 mm short axis node at the right thoracic inlet (series 3/image 26) --19 mm short axis AP window node (series 3/image 59) --18 mm short axis right paratracheal node (series 3/image 62) --20 mm short axis right hilar node (series 3/image 79) --23 mm short axis subcarinal node (series 3/image 82) --22 mm short axis left perihilar node (series 3/image 87) Visualized thyroid is unremarkable. Lungs/Pleura: 4.0 x 2.4 cm mass in the medial left upper lobe/perihilar region (series 3/image 67). Additional tumor along the left mediastinum (series 3/image 56) and extending to the left hilum. Associated lingular and left lower lobe postobstructive opacity/atelectasis, underlying tumor not excluded. Moderate left pleural effusion. Some degree of pleural-based nodularity anteriorly (series 3/image 105) and along the posteromedial base (series 3/image 129) suggests a malignant pleural effusion. Mild centrilobular and paraseptal emphysematous changes. Biapical pleural-parenchymal scarring. Mild subpleural reticulation/fibrosis in the right lower lobe. Trace right pleural fluid. No pneumothorax. Upper Abdomen: Visualized upper abdomen is notable for bilateral renal cysts and vascular calcifications.  Musculoskeletal: Degenerative changes of the visualized thoracolumbar spine. Median sternotomy. IMPRESSION: 4.0 cm medial left upper lobe/perihilar mass, extending into the left hilum, compatible with primary bronchogenic neoplasm. Associated thoracic nodal metastases. Postobstructive opacity/atelectasis in the lingula and left lower lobe. Moderate left pleural effusion, favored to be malignant. Aortic Atherosclerosis (ICD10-I70.0) and Emphysema (ICD10-J43.9). Electronically Signed   By: SJulian HyM.D.   On: 08/13/2021 20:10   IR THORACENTESIS ASP PLEURAL SPACE W/IMG GUIDE  Result Date: 08/14/2021 INDICATION: Patient with shortness of breath, left pleural effusion. Request is made for diagnostic and therapeutic thoracentesis. EXAM: ULTRASOUND GUIDED LEFT DIAGNOSTIC AND THERAPEUTIC THORACENTESIS MEDICATIONS: 10 mL 1% lidocaine COMPLICATIONS: None immediate. PROCEDURE: An ultrasound guided thoracentesis was thoroughly discussed with the patient and questions answered. The benefits, risks, alternatives and complications were also discussed. The patient understands and wishes to proceed with the procedure. Written consent was obtained. Ultrasound was performed to localize and mark an adequate pocket of fluid in the left chest. The area was then  prepped and draped in the normal sterile fashion. 1% Lidocaine was used for local anesthesia. Under ultrasound guidance a 6 Fr Safe-T-Centesis catheter was introduced. Thoracentesis was performed. The catheter was removed and a dressing applied. FINDINGS: A total of approximately 1.3 liters of amber fluid was removed. Samples were sent to the laboratory as requested by the clinical team. IMPRESSION: Successful ultrasound guided diagnostic and therapeutic left thoracentesis yielding 1.3 liters of pleural fluid. Read by: Brynda Greathouse PA-C Electronically Signed   By: Jacqulynn Cadet M.D.   On: 08/14/2021 12:00        Scheduled Meds:  allopurinol  300 mg Oral  QPM   artificial tears  1 application Both Eyes QHS   enoxaparin (LOVENOX) injection  30 mg Subcutaneous Q24H   ferrous sulfate  325 mg Oral QODAY   finasteride  5 mg Oral Daily   folic acid  3 mg Oral q morning   guaiFENesin  1,200 mg Oral BID   insulin aspart  0-9 Units Subcutaneous TID WC   ipratropium-albuterol  3 mL Nebulization TID   levothyroxine  50 mcg Oral Q0600   loratadine  10 mg Oral q morning   metoprolol succinate  25 mg Oral Daily   mometasone-formoterol  2 puff Inhalation BID   pantoprazole  40 mg Oral Q0600   rosuvastatin  10 mg Oral Q M,W,F   sertraline  50 mg Oral q morning   sodium bicarbonate  650 mg Oral BID   sodium chloride  1 drop Both Eyes QID   sodium chloride flush  3 mL Intravenous Q12H   Continuous Infusions:  sodium chloride 100 mL/hr at 08/15/21 1015     LOS: 1 day    Time spent: 35 minutes    Irine Seal, MD [facility]   To contact the attending provider between 7A-7P or the covering provider during after hours 7P-7A, please log into the web site [email or web address] and access using universal [employer] password for that web site. If you do not have the password, please call the hospital operator.  08/15/2021, 11:00 AM

## 2021-08-15 NOTE — Evaluation (Signed)
Physical Therapy Evaluation Patient Details Name: Seth Simpson MRN: [id number] DOB: 07/28/1931 Today's Date: 08/15/2021   History of Present Illness  87 y/o presented to ED on 9/1 for concerns of dyspnea. CT chest concerning of L upper lobe mass and moderate L sided pleural effusion concerning for malignancy. Patient underwent diagnostic and therapeutic thoracentesis. PMH: CAD s/p CABG, paroxysmal SVT on amiodarone, frequent PVCs, LBBB, type 2 diabetes, HTN, HLD, hypothyroidism, depression, BPH  Clinical Impression  PTA, patient lives alone and reports using RW for mobility in the home and w/c for community mobility. Patient states he walks <20' at a time. Daughter does grocery shopping. He performs sponges baths and "gets the important parts". Patient currently requires minA for ambulation with RW. Patient on 4L O2 during mobility with spO2 >92% but 3/4 DOE noted. Patient presents with generalized weakness, impaired balance and decreased activity tolerance. Patient will benefit from skilled PT services during acute stay to address listed deficits. Recommend SNF at discharge to maximize functional mobility and for patient's safety.     Follow Up Recommendations SNF;Supervision/Assistance - 24 hour    Equipment Recommendations  None recommended by PT    Recommendations for Other Services       Precautions / Restrictions Precautions Precautions: Fall Precaution Comments: on 4L O2 Arial Restrictions Weight Bearing Restrictions: No      Mobility  Bed Mobility Overal bed mobility: Needs Assistance Bed Mobility: Supine to Sit;Sit to Supine     Supine to sit: Min assist Sit to supine: Min assist   General bed mobility comments: minA to bring hips towards EOB and return LEs into bed    Transfers Overall transfer level: Needs assistance Equipment used: Rolling Renton Berkley (2 wheeled) Transfers: Sit to/from Stand Sit to Stand: Min guard         General transfer comment: min guard for  safety  Ambulation/Gait Ambulation/Gait assistance: Min assist Gait Distance (Feet): 20 Feet Assistive device: Rolling Cyan [place]oul[place] (2 wheeled) Gait Pattern/deviations: Step-to pattern;Decreased stride length;Narrow base of support;Trunk flexed Gait velocity: decreased   General Gait Details: trunk flexed throughout. MinA for balance and RW management. slow steady pace  Financial trader Rankin (Stroke Patients Only)       Balance Overall balance assessment: Needs assistance Sitting-balance support: No upper extremity supported;Feet supported Sitting balance-Leahy Scale: Fair     Standing balance support: Bilateral upper extremity supported;During functional activity Standing balance-Leahy Scale: Poor Standing balance comment: reliant on UE support and external assist                             Pertinent Vitals/Pain Pain Assessment: No/denies pain    Home Living Family/patient expects to be discharged to:: Private residence Living Arrangements: Alone Available Help at Discharge: Family;Available PRN/intermittently Type of Home: House Home Access: Ramped entrance     Home Layout: One level Home Equipment: Cherlynn Popiel - 2 wheels;Wheelchair - manual      Prior Function Level of Independence: Needs assistance   Gait / Transfers Assistance Needed: ambulates with RW in home independently but short distances <20'. Reports x1 fall in past month. Does not drive. Uses w/c for community mobility. Daughter drives him to appointments  ADL's / Homemaking Assistance Needed: daughter does grocery shopping. Patient reports difficulty dressing but "manages". Patient sponge bathes and "gets the important parts"        Hand  Dominance        Extremity/Trunk Assessment   Upper Extremity Assessment Upper Extremity Assessment: Defer to OT evaluation    Lower Extremity Assessment Lower Extremity Assessment: Generalized weakness     Cervical / Trunk Assessment Cervical / Trunk Assessment: Kyphotic  Communication   Communication: HOH  Cognition Arousal/Alertness: Awake/alert Behavior During Therapy: WFL for tasks assessed/performed Overall Cognitive Status: No family/caregiver present to determine baseline cognitive functioning                                 General Comments: pleasant but unsure of baseline cognition      General Comments      Exercises     Assessment/Plan    PT Assessment Patient needs continued PT services  PT Problem List Decreased strength;Decreased activity tolerance;Decreased balance;Decreased mobility;Decreased cognition;Decreased safety awareness;Cardiopulmonary status limiting activity       PT Treatment Interventions DME instruction;Gait training;Functional mobility training;Therapeutic activities;Balance training;Therapeutic exercise;Patient/family education    PT Goals (Current goals can be found in the Care Plan section)  Acute Rehab PT Goals Patient Stated Goal: to get better PT Goal Formulation: With patient Time For Goal Achievement: 08/29/21 Potential to Achieve Goals: Fair    Frequency Min 2X/week   Barriers to discharge        Co-evaluation               AM-PAC PT "6 Clicks" Mobility  Outcome Measure Help needed turning from your back to your side while in a flat bed without using bedrails?: A Little Help needed moving from lying on your back to sitting on the side of a flat bed without using bedrails?: A Little Help needed moving to and from a bed to a chair (including a wheelchair)?: A Little Help needed standing up from a chair using your arms (e.g., wheelchair or bedside chair)?: A Little Help needed to walk in hospital room?: A Little Help needed climbing 3-5 steps with a railing? : A Lot 6 Click Score: 17    End of Session Equipment Utilized During Treatment: Gait belt;Oxygen Activity Tolerance: Patient tolerated treatment  well Patient left: in bed;with call bell/phone within reach;with bed alarm set Nurse Communication: Mobility status PT Visit Diagnosis: Unsteadiness on feet (R26.81);Muscle weakness (generalized) (M62.81);History of falling (Z91.81);Difficulty in walking, not elsewhere classified (R26.2)    Time: [id number] PT Time Calculation (min) (ACUTE ONLY): 25 min   Charges:   PT Evaluation $PT Eval Moderate Complexity: 1 Mod PT Treatments $Therapeutic Activity: 8-22 mins        Moesha Sarchet A. [place]ford Rile PT, DPT Acute Rehabilitation Services Pager [phone] Office [phone]   Linna Hoff 08/15/2021, 5:38 PM

## 2021-08-15 NOTE — Progress Notes (Addendum)
NAME:  Seth Simpson, MRN:  [id number], DOB:  1931/12/13, LOS: 1 ADMISSION DATE:  08/13/2021, CONSULTATION DATE:  08/14/21 REFERRING MD:  Irine Seal, MD CHIEF COMPLAINT:  Lung Mass, Pleural Effusion   History of Present Illness:  Seth Simpson is an 87 year old male with CAD s/p CABG, DMII, hypertension, and hyperlipidemia who presented to the ER on 08/13/21 with shortness of breath.   The dyspnea was progressive over the past 2 weeks. Initial chest radiograph showed left pleural effusion and left hilar fullness. CT chest with contrast was then performed which showed 4cm left upper lobe/perihilar mass extending into the left hilum. Moderate to large left pleural effusion. He underwent thoracentesis by IR 9/2 with removal of 1.3L of amber fluid.   He is a former smoker, he quit at age 40 and has a 12 pack year smoking history. 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 He denies any harmful dust or chemical exposures. Denies any asbestos exposures.  He reports his dyspnea is improved after the thoracentesis. He denies any cough or wheezing. Denies any hemoptysis. He has a 10lbs weight loss over recent months and his appetite has decreased. His daughter was at the bedside.  Pertinent  Medical History  Coronary Artery Disease GERD Hypertension Hyperlipidemia Rheumatoid arthritis Diabetes Mellitus Type II  Significant Hospital Events: Including procedures, antibiotic start and stop dates in addition to other pertinent events   Admitted 9/1 for dyspnea, CT chest notable for left hilar mass and left pleural effusion 9/2 Thoracentesis with IR Cytology pending  Interim History / Subjective:  Remains on 4 L [place] with sats of 94-96% States his breathing is better, in no distress Pleural Fluid + for Mesothelial cells  Full Cytology pending  Objective   Blood pressure 137/78, pulse 69, temperature 98.7 F (37.1 C),  resp. rate 17, weight 65.8 kg, SpO2 92 %.        Intake/Output Summary (Last 24 hours) at 08/15/2021 0809 Last data filed at 08/14/2021 1730 Gross per 24 hour  Intake 731.25 ml  Output --  Net 731.25 ml   Filed Weights   08/13/21 2200  Weight: 65.8 kg    Examination: General: Elderly male, thin, no acute distress, asleep in bed HENT: [place]/AT, sclera anicteric, moist mucous membranes, No LAD or JVD noted Lungs: Bilateral chest excursion, clear to auscultation. Slightly diminished per bases, No wheezing or rhonchi. Cardiovascular: rrr, systolic murmur Abdomen: soft, non-tender, non-distended, bowel sounds present, Body mass index is 22.06 kg/m. Extremities: warm, trace edema, no obvious deformities Neuro: alert, moving all extremities, hard of hearing GU: deferred  Labs reviewed WBC 12.2 ( 9.4) , HGB 11.4, platelets 218 K No imaging, No chemistry  Resolved Hospital Problem list     Assessment & Plan:  Acute Hypoxemic Respiratory Failure Left Pleural Effusion Left Hilar Mass Emphysema  - Continue supplemental oxygen as needed to maintain SpO2 90-92% - Continue Duo nebs as ordered - CXR prn - Will follow up pleural fluid cytology studies.  - If pleural fluid is non-diagnostic, then the patient is amenable to bronchoscopy with EBUS in order to get tissue diagnosis.   PCCM will continue to follow.  Labs   CBC: Recent Labs  Lab 08/13/21 1227 08/14/21 0322  WBC 11.0* 9.4  NEUTROABS 8.8*  --   HGB 12.9* 11.8*  HCT 39.8 36.6*  MCV 109.6* 109.9*  PLT 238 213  Basic Metabolic Panel: Recent Labs  Lab 08/13/21 1227 08/14/21 0322  NA 137 136  K 4.3 4.3  CL 110 111  CO2 18* 19*  GLUCOSE 145* 107*  BUN 29* 26*  CREATININE 1.78* 1.62*  CALCIUM 11.5* 10.9*    GFR: Estimated Creatinine Clearance: 29.3 mL/min (A) (by C-G formula based on SCr of 1.62 mg/dL (H)). Recent Labs  Lab 08/13/21 1227 08/14/21 0322  WBC 11.0* 9.4     Liver Function Tests: Recent  Labs  Lab 08/14/21 0322  AST 31  ALT 27  ALKPHOS 85  BILITOT 0.9  PROT 5.5*  ALBUMIN 2.6*    No results for input(s): LIPASE, AMYLASE in the last 168 hours. No results for input(s): AMMONIA in the last 168 hours.  ABG    Component Value Date/Time   PHART 7.380 04/11/2008 2044   PCO2ART 37.0 04/11/2008 2044   PO2ART 122.0 (H) 04/11/2008 2044   HCO3 21.8 04/11/2008 2044   TCO2 22 07/26/2017 0715   ACIDBASEDEF 3.0 (H) 04/11/2008 2044   O2SAT 99.0 04/11/2008 2044      Coagulation Profile: No results for input(s): INR, PROTIME in the last 168 hours.  Cardiac Enzymes: No results for input(s): CKTOTAL, CKMB, CKMBINDEX, TROPONINI in the last 168 hours.  HbA1C: Hgb A1c MFr Bld  Date/Time Value Ref Range Status  08/14/2021 03:22 AM 7.2 (H) 4.8 - 5.6 % Final    Comment:    (NOTE) Pre diabetes:          5.7%-6.4%  Diabetes:              >6.4%  Glycemic control for   <7.0% adults with diabetes   04/11/2008 03:43 AM   Final   5.7 (NOTE)   The ADA recommends the following therapeutic goals for glycemic   control related to Hgb A1C measurement:   Goal of Therapy:   < 7.0% Hgb A1C   Action Suggested:  > 8.0% Hgb A1C   Ref:  Diabetes Care, 22, Suppl. 1, 1999    CBG: Recent Labs  Lab 08/14/21 0854 08/14/21 1220 08/14/21 1613 08/15/21 0744  GLUCAP 113* 117* 160* 110*      Past Medical History:  He,  has a past medical history of Bicuspid aortic valve, Bladder tumor, BPH (benign prostatic hypertrophy), Coronary artery disease (cardiologist-  dr Marlou Porch), Depression, Erectile dysfunction, GERD (gastroesophageal reflux disease), Gout, Heart murmur, Hiatal hernia, History of bladder stone, History of kidney stones, History of prostatitis, History of ST elevation myocardial infarction (STEMI), History of syncope, Hyperlipidemia, Hypertension, Iron deficiency anemia, LAFB (left anterior fascicular block), Lower urinary tract symptoms (LUTS), Mild aortic stenosis, OA  (osteoarthritis), PSVT (paroxysmal supraventricular tachycardia) ([place]), PVC's (premature ventricular contractions), Rheumatoid arthritis ([place]) (10/02/2013), S/P CABG x 5, Sigmoid diverticulosis, Type II diabetes mellitus ([place]), and Wears glasses.   Surgical History:   Past Surgical History:  Procedure Laterality Date   [place]  04/06/2012   Procedure: BALLOON DILATION;  Surgeon: Garlan Fair, MD;  Location: [facility];  Service: Endoscopy;  Laterality: N/A;   CARDIAC CATHETERIZATION  04-10-2008  dr Daneen Schick   3 vessel CAD   CATARACT EXTRACTION W/ INTRAOCULAR LENS  IMPLANT, BILATERAL Bilateral 2003   CHOLECYSTECTOMY OPEN  1978   COLONOSCOPY  04/06/2012   Procedure: COLONOSCOPY;  Surgeon: Garlan Fair, MD;  Location: [facility];  Service: Endoscopy;  Laterality: N/A;   CORONARY ARTERY BYPASS GRAFT  04-11-2008   LIMA to RI,  SVG to D1,  SVG to LAD,  sequetial SVG to Acute Marginal and pRCA   CYSTOSCOPY W/ RETROGRADES Bilateral 07/26/2017   Procedure: CYSTOSCOPY WITH BILATERAL  RETROGRADE PYELOGRAM;  Surgeon: Irine Seal, MD;  Location: [facility];  Service: Urology;  Laterality: Bilateral;   CYSTOSCOPY WITH BIOPSY N/A 06/17/2016   Procedure: CYSTOSCOPY WITH BIOPSY;  Surgeon: Irine Seal, MD;  Location: [facility];  Service: Urology;  Laterality: N/A;   CYSTOSCOPY WITH BIOPSY N/A 07/26/2017   Procedure: urethral dilation CYSTOSCOPY WITH BIOPSY AND FULGURATION, removal of bladder stone;  Surgeon: Irine Seal, MD;  Location: [facility];  Service: Urology;  Laterality: N/A;   CYSTOSCOPY WITH LITHOLAPAXY N/A 06/17/2016   Procedure: CYSTOSCOPY WITH LITHOLAPAXY;  Surgeon: Irine Seal, MD;  Location[facility];  Service: Urology;  Laterality: N/A;   CYSTOSCOPY WITH RETROGRADE PYELOGRAM, URETEROSCOPY AND STENT PLACEMENT  06-17-2016  dr Irine Seal   Cysto/  Bilateral RTG's/  Bladder bx and fulgeration  bladder tumor's left & right lateral wall's/  Litholapaxy bladder stone/  left ureteroscopy/  Insertion bilateral double J stents   CYSTOSCOPY WITH RETROGRADE PYELOGRAM, URETEROSCOPY AND STENT PLACEMENT Bilateral 06/17/2016   Procedure: CYSTOSCOPY BILATERAL RETROGRADE,  LEFT URETEROSCOPY AND BILATERAL STENT PLACEMENT;  Surgeon: Irine Seal, MD;  Location: [facility];  Service: Urology;  Laterality: Bilateral;   CYSTOSCOPY WITH URETEROSCOPY AND STENT PLACEMENT Bilateral 07/01/2016   Procedure: BILATERAL URETEROSCOPY STONE EXTRACTIONS WITH POSSIBLE STENT PLACEMENT;  Surgeon: Irine Seal, MD;  Location: [facility];  Service: Urology;  Laterality: Bilateral;   ESOPHAGOGASTRODUODENOSCOPY  04/06/2012   Procedure: ESOPHAGOGASTRODUODENOSCOPY (EGD);  Surgeon: Garlan Fair, MD;  Location: [facility];  Service: Endoscopy;  Laterality: N/A;   FINGER SURGERY Left 2000's   "removed BB", 2nd digit   HOLMIUM LASER APPLICATION N/A 0000000   Procedure: HOLMIUM LASER APPLICATION;  Surgeon: Irine Seal, MD;  Location: [facility];  Service: Urology;  Laterality: N/A;   HOLMIUM LASER APPLICATION Bilateral [id number]   Procedure: HOLMIUM LASER APPLICATION;  Surgeon: Irine Seal, MD;  Location: [facility];  Service: Urology;  Laterality: Bilateral;   IR THORACENTESIS ASP PLEURAL SPACE W/IMG GUIDE  08/14/2021   TOTAL KNEE ARTHROPLASTY Bilateral right 03-20-2008//  left 08-23-2007   TRANSTHORACIC ECHOCARDIOGRAM  12-09-2016 dr skains   mild LVH,  ef 50-55%/  Bicuspid AV with stenosis, valve area 1.47 cm^2 (previous echo it was 1.67cm^2 on 04-23-2014), trivial regurg/  mild LAE/ mild MR/  moderate TR, PASP 29mHg/      Social History:   reports that he quit smoking about 56 years ago. His smoking use included cigarettes. He has a 15.00 pack-year smoking history. He has never used smokeless tobacco. He reports current alcohol use of about 2.0 standard drinks per  week. He reports that he does not use drugs.   Family History:  His family history includes Heart disease in his father.   Allergies Allergies  Allergen Reactions   Peanut Allergen Powder-Dnfp Anaphylaxis   Peanut-Containing Drug Products Anaphylaxis, Shortness Of Breath and Swelling    Peanuts... Anaphylaxis   Hydrochlorothiazide Other (See Comments)    Avoids , flares up Gout   Other Diarrhea    Splenda   Pistachio Nut (Diagnostic) Swelling and Other (See Comments)    gout   Codeine Itching and Rash   Fruit & Vegetable Daily [Nutritional Supplements] Rash    Papaya     Home Medications  Prior  to Admission medications   Medication Sig Start Date End Date Taking? Authorizing Provider  ACCU-CHEK AVIVA PLUS test strip  11/10/15  Yes [provider]  ACCU-CHEK SOFTCLIX LANCETS lancets USE TO TEST BLOOD SUGAR DAILY 08/15/15  Yes [provider]  acetaminophen (TYLENOL) 500 MG tablet Take 500 mg by mouth every 6 (six) hours as needed for mild pain, moderate pain, fever or headache.   Yes [provider]  allopurinol (ZYLOPRIM) 300 MG tablet Take 300 mg by mouth every evening.    Yes [provider]  amiodarone (PACERONE) 200 MG tablet Take 0.5 tablets (100 mg total) by mouth daily. 03/18/21  Yes Jerline Pain, MD  amLODipine-benazepril (LOTREL) 5-20 MG per capsule Take 1 capsule by mouth at bedtime.   Yes [provider]  Cholecalciferol (VITAMIN D3) 2000 units TABS Take 2,000 Units by mouth every evening.   Yes [provider]  colchicine 0.6 MG tablet Take 0.6 mg by mouth daily as needed (gout).    Yes [provider]  [employer] SOD CHLORIDE HYPERTONICITY 5 % ophthalmic ointment Place 1 application into both eyes at bedtime. 06/11/21  Yes [provider]  [employer] SODIUM CHLORIDE 5 % ophthalmic solution Place 1 drop into both eyes 4 (four) times daily. 06/11/21  Yes [provider]  diphenhydrAMINE (BENADRYL) 25 MG  tablet Take 25 mg by mouth daily as needed for allergies. Reported on 06/19/2016   Yes [provider]  ferrous sulfate 325 (65 FE) MG tablet Take 325 mg by mouth every other day.   Yes [provider]  finasteride (PROSCAR) 5 MG tablet Take 5 mg by mouth daily.   Yes [provider]  Flaxseed, Linseed, (FLAXSEED OIL MAX STR) 1300 MG CAPS Take 1 capsule by mouth daily.   Yes [provider]  folic acid (FOLVITE) 1 MG tablet Take 3 mg by mouth every morning.   Yes [provider]  hydrALAZINE (APRESOLINE) 25 MG tablet Take 25 mg by mouth 2 (two) times daily. 01/17/21  Yes [provider]  levothyroxine (SYNTHROID) 50 MCG tablet Take 50 mcg by mouth daily.   Yes [provider]  loratadine (CLARITIN) 10 MG tablet Take 10 mg by mouth every morning.    Yes [provider]  metFORMIN (GLUCOPHAGE-XR) 500 MG 24 hr tablet Take 500 mg by mouth every evening. 06/05/21  Yes [provider]  methotrexate (RHEUMATREX) 2.5 MG tablet Take 12.5 mg by mouth every Friday. Take on Fridays.  Caution:Chemotherapy. Protect from light.   Yes [provider]  Multiple Vitamin (MULTIVITAMIN WITH MINERALS) TABS tablet Take 1 tablet by mouth every evening.    Yes [provider]  omeprazole (PRILOSEC OTC) 20 MG tablet Take 10 mg by mouth as needed.    Yes [provider]  rosuvastatin (CRESTOR) 10 MG tablet Take 10 mg by mouth every Monday, Wednesday, and Friday.    Yes [provider]  sertraline (ZOLOFT) 100 MG tablet Take 50 mg by mouth every morning.  10/05/14  Yes [provider]  silodosin (RAPAFLO) 8 MG CAPS capsule Take 8 mg by mouth daily. 05/31/21  Yes [provider]  terazosin (HYTRIN) 5 MG capsule Take 5 mg by mouth at bedtime.    Yes [provider]  metoprolol succinate (TOPROL-XL) 25 MG 24 hr tablet Take 1 tablet (25 mg total) by mouth daily. Patient not taking: Reported on  08/13/2021 11/27/18   Jerline Pain, MD  Critical care time: n/a   Magdalen Spatz, MSN, AGACNP-BC [place] for personal pager PCCM on call pager [phone]  08/15/2021 8:27 AM   See Amion for personal pager>>  PCCM on call pager [phone] until 7pm. [facility] Use only) Please call Elink 7p-7a. [phone] [facility] Use only)

## 2021-08-16 DIAGNOSIS — I10 Essential (primary) hypertension: Secondary | ICD-10-CM | POA: Diagnosis not present

## 2021-08-16 DIAGNOSIS — R918 Other nonspecific abnormal finding of lung field: Secondary | ICD-10-CM | POA: Diagnosis not present

## 2021-08-16 DIAGNOSIS — I2510 Atherosclerotic heart disease of native coronary artery without angina pectoris: Secondary | ICD-10-CM | POA: Diagnosis not present

## 2021-08-16 DIAGNOSIS — J9601 Acute respiratory failure with hypoxia: Secondary | ICD-10-CM | POA: Diagnosis not present

## 2021-08-16 LAB — RENAL FUNCTION PANEL
Albumin: 2.3 g/dL — ABNORMAL LOW (ref 3.5–5.0)
Anion gap: 6 (ref 5–15)
BUN: 22 mg/dL (ref 8–23)
CO2: 17 mmol/L — ABNORMAL LOW (ref 22–32)
Calcium: 10.5 mg/dL — ABNORMAL HIGH (ref 8.9–10.3)
Chloride: 115 mmol/L — ABNORMAL HIGH (ref 98–111)
Creatinine, Ser: 1.53 mg/dL — ABNORMAL HIGH (ref 0.61–1.24)
GFR, Estimated: 43 mL/min — ABNORMAL LOW (ref >60)
Glucose, Bld: 128 mg/dL — ABNORMAL HIGH (ref 70–99)
Phosphorus: 2.3 mg/dL — ABNORMAL LOW (ref 2.5–4.6)
Potassium: 4 mmol/L (ref 3.5–5.1)
Sodium: 138 mmol/L (ref 135–145)

## 2021-08-16 LAB — CBC WITH DIFFERENTIAL/PLATELET
Abs Immature Granulocytes: 0.09 10*3/uL — ABNORMAL HIGH (ref 0.00–0.07)
Basophils Absolute: 0.1 10*3/uL (ref 0.0–0.1)
Basophils Relative: 0 %
Eosinophils Absolute: 0.6 10*3/uL — ABNORMAL HIGH (ref 0.0–0.5)
Eosinophils Relative: 5 %
HCT: 34.9 % — ABNORMAL LOW (ref 39.0–52.0)
Hemoglobin: 11.6 g/dL — ABNORMAL LOW (ref 13.0–17.0)
Immature Granulocytes: 1 %
Lymphocytes Relative: 6 %
Lymphs Abs: 0.7 10*3/uL (ref 0.7–4.0)
MCH: 35.8 pg — ABNORMAL HIGH (ref 26.0–34.0)
MCHC: 33.2 g/dL (ref 30.0–36.0)
MCV: 107.7 fL — ABNORMAL HIGH (ref 80.0–100.0)
Monocytes Absolute: 1.1 10*3/uL — ABNORMAL HIGH (ref 0.1–1.0)
Monocytes Relative: 10 %
Neutro Abs: 9.4 10*3/uL — ABNORMAL HIGH (ref 1.7–7.7)
Neutrophils Relative %: 78 %
Platelets: 209 10*3/uL (ref 150–400)
RBC: 3.24 MIL/uL — ABNORMAL LOW (ref 4.22–5.81)
RDW: 14.6 % (ref 11.5–15.5)
WBC: 11.9 10*3/uL — ABNORMAL HIGH (ref 4.0–10.5)
nRBC: 0 % (ref 0.0–0.2)

## 2021-08-16 LAB — GLUCOSE, CAPILLARY
Glucose-Capillary: 162 mg/dL — ABNORMAL HIGH (ref 70–99)
Glucose-Capillary: 151 mg/dL — ABNORMAL HIGH (ref 70–99)
Glucose-Capillary: 140 mg/dL — ABNORMAL HIGH (ref 70–99)
Glucose-Capillary: 132 mg/dL — ABNORMAL HIGH (ref 70–99)

## 2021-08-16 LAB — URINE CULTURE: Culture: NO GROWTH

## 2021-08-16 MED ORDER — IPRATROPIUM-ALBUTEROL 0.5-2.5 (3) MG/3ML IN SOLN
3.0000 mL | Freq: Four times a day (QID) | RESPIRATORY_TRACT | Status: DC | PRN
  Administered 2021-08-25 – 2021-08-27 (×4): 3 mL via RESPIRATORY_TRACT
  Filled 2021-08-16 (×4): qty 3

## 2021-08-16 MED ORDER — SODIUM BICARBONATE 650 MG PO TABS
650.0000 mg | ORAL_TABLET | Freq: Three times a day (TID) | ORAL | Status: AC
  Administered 2021-08-16 – 2021-08-19 (×9): 650 mg via ORAL
  Filled 2021-08-16 (×9): qty 1

## 2021-08-16 MED ORDER — SODIUM CHLORIDE 0.9 % IV SOLN: INTRAVENOUS | Status: DC

## 2021-08-16 NOTE — Progress Notes (Signed)
PROGRESS NOTE    Seth Simpson  [id number] DOB: 1931/09/26 DOA: 08/13/2021 PCP: Josetta Huddle, MD    No chief complaint on file.   Brief Narrative:  Patient is a 87 year old gentleman history of coronary artery disease status post CABG, paroxysmal SVT on amiodarone, frequent PVCs, LBBB, type 2 diabetes, hypertension, hyperlipidemia, hypothyroidism, depression, BPH presented to the ED with concerns for dyspnea.  Work-up done including CT chest concerning for left upper lobe mass and moderate left-sided pleural effusion concerning for malignancy.  Patient admitted diagnostic and therapeutic thoracentesis ordered and currently pending.  PCCM consulted.   Assessment & Plan:   Principal Problem:   Acute respiratory failure with hypoxia (HCC) Active Problems:   Coronary artery disease   Hypertension   Hypercholesteremia   PSVT (paroxysmal supraventricular tachycardia) (HCC)   Type II diabetes mellitus (HCC)   Mass of upper lobe of left lung   Pleural effusion   1 acute respiratory failure with hypoxia secondary to left upper lobe/perihilar lung mass concerning for primary bronchogenic neoplasm and likely left malignant pleural effusion -Patient presented with dyspnea. -Chest x-ray done on admission with left base collapse/consolidation with left suprahilar fullness and moderate left effusion concerning for neoplasm. -CT chest done with 4.0 cm medial left upper lobe/perihilar mass, extending into the left hilum compatible with primary bronchogenic neoplasm.  Associated thoracic nodal mets.  Postobstructive opacity/atelectasis in the lingula and left lower lobe.  Moderate left pleural effusion favored to be malignant.  Emphysema.  Aortic atherosclerosis. -Patient afebrile.   -Leukocytosis fluctuating.   -Status post ultrasound-guided diagnostic and therapeutic thoracentesis with mesothelial cells seen, cytology pending.  -Continue scheduled duo nebs, Claritin, Protonix, Mucinex,  Dulera. -Flutter valve, incentive spirometry. -Pulmonary consulted and following, if thoracentesis and diagnostic patient may need diagnostic bronchoscopy. -Supportive care.     2.  Acute kidney injury -Felt likely secondary to prerenal azotemia due to recent GI losses of emesis and diarrhea in the setting of ACE inhibitor. -Last creatinine noted on epic was 1.00 on 07/26/2017. -Creatinine currently at 1.53.  -Urinalysis nitrite negative, leukocytes negative, negative for protein.  Urine sodium at 60.  Urine creatinine at 98.33.   -Continue to hold ACE inhibitor.   -Continue IV fluids of NS at 100 cc an hour.   -Monitor urine output.   -Follow.    3.  Hypercalcemia -In the setting of suspected malignancy. -Patient also noted to be on oral vitamin D which will not resume during the hospitalization. -Calcium levels trending down with hydration. -Corrected calcium 11.86. -Check a PTH, ionized calcium, PTHrp -Continue IV fluids. -May need a dose of Zometa however will follow for now.  4.  Coronary artery disease status post CABG -Continue Toprol-XL, statin.   5.  Paroxysmal SVTs and frequent PVCs -Amiodarone on hold due to acute pulmonary issues. -May consider resumption of amiodarone in the next few days, if pulmonary status remains stable.  -Continue Toprol-XL.   6.  Rheumatoid arthritis -Continue to hold methotrexate. -Outpatient follow-up.  7.  Diabetes mellitus type 2 -Hemoglobin A1c 7.2. -CBG 162 this morning.   -Continue to hold oral hypoglycemic agents.   -SSI.  8.  Hypothyroidism -Synthroid.  9.  Depression -Zoloft.    10.  Hypertension -Continue Toprol-XL.   -ACE inhibitor on hold.   -Follow.  11.  Hyperlipidemia -Statin.     DVT prophylaxis: Lovenox. Code Status: DNR Family Communication: Updated daughter at bedside Disposition:   Status is: Inpatient  The patient will require care spanning >  2 midnights and should be moved to inpatient because:  Inpatient level of care appropriate due to severity of illness  Dispo: The patient is from: Home              Anticipated d/c is to: SNF              Patient currently is not medically stable to d/c.   Difficult to place patient No       Consultants:  PCCM: Dr. Erin Fulling 08/14/2021  Procedures: CT chest 08/13/2021 Chest x-ray 08/13/2021, 08/14/2021 Ultrasound-guided diagnostic and therapeutic thoracentesis per IR 08/14/2021--1.3 L of amber fluid removed.   Antimicrobials:  None   Subjective: Patient laying in bed.  Denies chest pain.  No shortness of breath.  No abdominal pain.  Daughter at bedside.    Objective: Vitals:   08/15/21 2007 08/15/21 2016 08/16/21 0403 08/16/21 0843  BP:  119/60 116/66   Pulse: (!) 57     Resp: 15     Temp:  98.8 F (37.1 C) 98.3 F (36.8 C)   TempSrc:  Oral Oral   SpO2: 91%   92%  Weight:        Intake/Output Summary (Last 24 hours) at 08/16/2021 1034 Last data filed at 08/16/2021 0604 Gross per 24 hour  Intake 1471.5 ml  Output 1250 ml  Net 221.5 ml    Filed Weights   08/13/21 2200  Weight: 65.8 kg    Examination:  General exam: NAD. Respiratory system: Improving decreased left basilar breath sounds.  No wheezing.  No crackles.  Fair air movement.  Speaking in full sentences. Cardiovascular system: RRR with 3/6 systolic ejection murmur.  No lower extremity edema.   Gastrointestinal system: Abdomen soft, nontender, nondistended, positive bowel sounds.  No rebound.  No guarding.   Central nervous system: Alert and oriented. No focal neurological deficits. Extremities: Symmetric 5 x 5 power. Skin: No rashes, lesions or ulcers Psychiatry: Judgement and insight appear normal. Mood & affect appropriate.     Data Reviewed: I have personally reviewed following labs and imaging studies  CBC: Recent Labs  Lab 08/13/21 1227 08/14/21 0322 08/15/21 0740 08/16/21 0107  WBC 11.0* 9.4 12.2* 11.9*  NEUTROABS 8.8*  --   --  9.4*  HGB 12.9*  11.8* 11.4* 11.6*  HCT 39.8 36.6* 35.1* 34.9*  MCV 109.6* 109.9* 108.3* 107.7*  PLT 238 213 218 209     Basic Metabolic Panel: Recent Labs  Lab 08/13/21 1227 08/14/21 0322 08/15/21 0740 08/16/21 0107  NA 137 136 138 138  K 4.3 4.3 4.0 4.0  CL 110 111 114* 115*  CO2 18* 19* 18* 17*  GLUCOSE 145* 107* 124* 128*  BUN 29* 26* 24* 22  CREATININE 1.78* 1.62* 1.62* 1.53*  CALCIUM 11.5* 10.9* 10.6* 10.5*  PHOS  --   --  2.6 2.3*     GFR: Estimated Creatinine Clearance: 31.1 mL/min (A) (by C-G formula based on SCr of 1.53 mg/dL (H)).  Liver Function Tests: Recent Labs  Lab 08/14/21 0322 08/15/21 0740 08/16/21 0107  AST 31  --   --   ALT 27  --   --   ALKPHOS 85  --   --   BILITOT 0.9  --   --   PROT 5.5*  --   --   ALBUMIN 2.6* 2.3* 2.3*     CBG: Recent Labs  Lab 08/15/21 0744 08/15/21 1228 08/15/21 1604 08/15/21 2113 08/16/21 0747  GLUCAP 110* 170* 133* 141* 162*  Recent Results (from the past 240 hour(s))  Resp Panel by RT-PCR (Flu A&B, Covid) Nasopharyngeal Swab     Status: None   Collection Time: 08/13/21  4:36 PM   Specimen: Nasopharyngeal Swab; Nasopharyngeal(NP) swabs in vial transport medium  Result Value Ref Range Status   SARS Coronavirus 2 by RT PCR NEGATIVE NEGATIVE Final    Comment: (NOTE) SARS-CoV-2 target nucleic acids are NOT DETECTED.  The SARS-CoV-2 RNA is generally detectable in upper respiratory specimens during the acute phase of infection. The lowest concentration of SARS-CoV-2 viral copies this assay can detect is 138 copies/mL. A negative result does not preclude SARS-Cov-2 infection and should not be used as the sole basis for treatment or other patient management decisions. A negative result may occur with  improper specimen collection/handling, submission of specimen other than nasopharyngeal swab, presence of viral mutation(s) within the areas targeted by this assay, and inadequate number of viral copies(<138  copies/mL). A negative result must be combined with clinical observations, patient history, and epidemiological information. The expected result is Negative.  Fact Sheet for Patients:  EntrepreneurPulse.com.au  Fact Sheet for Healthcare Providers:  IncredibleEmployment.be  This test is no t yet approved or cleared by the [place] FDA and  has been authorized for detection and/or diagnosis of SARS-CoV-2 by FDA under an Emergency Use Authorization (EUA). This EUA will remain  in effect (meaning this test can be used) for the duration of the COVID-19 declaration under Section 564(b)(1) of the Act, 21 U.S.C.section 360bbb-3(b)(1), unless the authorization is terminated  or revoked sooner.       Influenza A by PCR NEGATIVE NEGATIVE Final   Influenza B by PCR NEGATIVE NEGATIVE Final    Comment: (NOTE) The Xpert Xpress SARS-CoV-2/FLU/RSV plus assay is intended as an aid in the diagnosis of influenza from Nasopharyngeal swab specimens and should not be used as a sole basis for treatment. Nasal washings and aspirates are unacceptable for Xpert Xpress SARS-CoV-2/FLU/RSV testing.  Fact Sheet for Patients: EntrepreneurPulse.com.au  Fact Sheet for Healthcare Providers: IncredibleEmployment.be  This test is not yet approved or cleared by the [place] FDA and has been authorized for detection and/or diagnosis of SARS-CoV-2 by FDA under an Emergency Use Authorization (EUA). This EUA will remain in effect (meaning this test can be used) for the duration of the COVID-19 declaration under Section 564(b)(1) of the Act, 21 U.S.C. section 360bbb-3(b)(1), unless the authorization is terminated or revoked.  Performed at [facility], [place] [address], [place], [place] [address]   Body fluid culture w Gram Stain     Status: None (Preliminary result)   Collection Time: 08/14/21 11:59 AM   Specimen: Lung, Left;  Pleural Fluid  Result Value Ref Range Status   Specimen Description FLUID  Final   Special Requests LUNG LEFT  Final   Gram Stain   Final    FEW WBC PRESENT, PREDOMINANTLY MONONUCLEAR NO ORGANISMS SEEN    Culture   Final    NO GROWTH 2 DAYS Performed at [facility], [place] [address], [place], [place] [address]    Report Status PENDING  Incomplete  Urine Culture     Status: None   Collection Time: 08/14/21  4:32 PM   Specimen: Urine, Clean Catch  Result Value Ref Range Status   Specimen Description URINE, CLEAN CATCH  Final   Special Requests NONE  Final   Culture   Final    NO GROWTH Performed at [facility], [address] [place], [place] [address]    Report Status 08/16/2021 FINAL  Final          Radiology Studies: DG Chest 1 View  Result Date: 08/14/2021 CLINICAL DATA:  Status post left thoracentesis EXAM: CHEST  1 VIEW COMPARISON:  Chest radiograph 08/13/2021 FINDINGS: Median sternotomy wires and mediastinal surgical clips are stable. The cardiomediastinal silhouette is grossly stable. Perihilar opacities likely correspond to lymphadenopathy in the left perihilar mass seen on the prior CT. The left pleural effusion has significantly decreased in size following thoracentesis with improved aeration of the left lung. There is no appreciable pneumothorax. Streaky opacities in the right base likely reflect atelectasis. The right lung is otherwise clear. There is no right pleural effusion or pneumothorax. IMPRESSION: Decreased left pleural effusion with improved aeration of the left lung following thoracentesis. No pneumothorax. Electronically Signed   By: Valetta Mole M.D.   On: 08/14/2021 12:11   IR THORACENTESIS ASP PLEURAL SPACE W/IMG GUIDE  Result Date: 08/14/2021 INDICATION: Patient with shortness of breath, left pleural effusion. Request is made for diagnostic and therapeutic thoracentesis. EXAM: ULTRASOUND GUIDED LEFT DIAGNOSTIC AND THERAPEUTIC THORACENTESIS  MEDICATIONS: 10 mL 1% lidocaine COMPLICATIONS: None immediate. PROCEDURE: An ultrasound guided thoracentesis was thoroughly discussed with the patient and questions answered. The benefits, risks, alternatives and complications were also discussed. The patient understands and wishes to proceed with the procedure. Written consent was obtained. Ultrasound was performed to localize and mark an adequate pocket of fluid in the left chest. The area was then prepped and draped in the normal sterile fashion. 1% Lidocaine was used for local anesthesia. Under ultrasound guidance a 6 Fr Safe-T-Centesis catheter was introduced. Thoracentesis was performed. The catheter was removed and a dressing applied. FINDINGS: A total of approximately 1.3 liters of amber fluid was removed. Samples were sent to the laboratory as requested by the clinical team. IMPRESSION: Successful ultrasound guided diagnostic and therapeutic left thoracentesis yielding 1.3 liters of pleural fluid. Read by: Brynda Greathouse PA-C Electronically Signed   By: Jacqulynn Cadet M.D.   On: 08/14/2021 12:00        Scheduled Meds:  allopurinol  300 mg Oral QPM   artificial tears  1 application Both Eyes QHS   enoxaparin (LOVENOX) injection  30 mg Subcutaneous Q24H   ferrous sulfate  325 mg Oral QODAY   finasteride  5 mg Oral Daily   folic acid  3 mg Oral q morning   guaiFENesin  1,200 mg Oral BID   insulin aspart  0-9 Units Subcutaneous TID WC   ipratropium-albuterol  3 mL Nebulization TID   levothyroxine  50 mcg Oral Q0600   loratadine  10 mg Oral q morning   metoprolol succinate  25 mg Oral Daily   mometasone-formoterol  2 puff Inhalation BID   pantoprazole  40 mg Oral Q0600   rosuvastatin  10 mg Oral Q M,W,F   sertraline  50 mg Oral q morning   sodium bicarbonate  650 mg Oral TID   sodium chloride  1 drop Both Eyes QID   sodium chloride flush  3 mL Intravenous Q12H   Continuous Infusions:  sodium chloride       LOS: 2 days    Time  spent: 35 minutes    Irine Seal, MD [facility]   To contact the attending provider between 7A-7P or the covering provider during after hours 7P-7A, please log into the web site [email or web address] and access using universal [place] password for that  web site. If you do not have the password, please call the hospital operator.  08/16/2021, 10:34 AM

## 2021-08-16 NOTE — Plan of Care (Signed)

## 2021-08-17 DIAGNOSIS — I2510 Atherosclerotic heart disease of native coronary artery without angina pectoris: Secondary | ICD-10-CM | POA: Diagnosis not present

## 2021-08-17 DIAGNOSIS — I10 Essential (primary) hypertension: Secondary | ICD-10-CM | POA: Diagnosis not present

## 2021-08-17 DIAGNOSIS — J9601 Acute respiratory failure with hypoxia: Secondary | ICD-10-CM | POA: Diagnosis not present

## 2021-08-17 DIAGNOSIS — R918 Other nonspecific abnormal finding of lung field: Secondary | ICD-10-CM | POA: Diagnosis not present

## 2021-08-17 LAB — GLUCOSE, CAPILLARY
Glucose-Capillary: 168 mg/dL — ABNORMAL HIGH (ref 70–99)
Glucose-Capillary: 182 mg/dL — ABNORMAL HIGH (ref 70–99)
Glucose-Capillary: 142 mg/dL — ABNORMAL HIGH (ref 70–99)
Glucose-Capillary: 210 mg/dL — ABNORMAL HIGH (ref 70–99)

## 2021-08-17 LAB — CBC WITH DIFFERENTIAL/PLATELET
Abs Immature Granulocytes: 0.1 10*3/uL — ABNORMAL HIGH (ref 0.00–0.07)
Basophils Absolute: 0 10*3/uL (ref 0.0–0.1)
Basophils Relative: 0 %
Eosinophils Absolute: 0.5 10*3/uL (ref 0.0–0.5)
Eosinophils Relative: 4 %
HCT: 37.5 % — ABNORMAL LOW (ref 39.0–52.0)
Hemoglobin: 12.8 g/dL — ABNORMAL LOW (ref 13.0–17.0)
Immature Granulocytes: 1 %
Lymphocytes Relative: 5 %
Lymphs Abs: 0.6 10*3/uL — ABNORMAL LOW (ref 0.7–4.0)
MCH: 36.6 pg — ABNORMAL HIGH (ref 26.0–34.0)
MCHC: 34.1 g/dL (ref 30.0–36.0)
MCV: 107.1 fL — ABNORMAL HIGH (ref 80.0–100.0)
Monocytes Absolute: 1.1 10*3/uL — ABNORMAL HIGH (ref 0.1–1.0)
Monocytes Relative: 10 %
Neutro Abs: 8.9 10*3/uL — ABNORMAL HIGH (ref 1.7–7.7)
Neutrophils Relative %: 80 %
Platelets: 220 10*3/uL (ref 150–400)
RBC: 3.5 MIL/uL — ABNORMAL LOW (ref 4.22–5.81)
RDW: 14.6 % (ref 11.5–15.5)
WBC: 11.2 10*3/uL — ABNORMAL HIGH (ref 4.0–10.5)
nRBC: 0 % (ref 0.0–0.2)

## 2021-08-17 LAB — RENAL FUNCTION PANEL
Albumin: 2.3 g/dL — ABNORMAL LOW (ref 3.5–5.0)
Anion gap: 9 (ref 5–15)
BUN: 19 mg/dL (ref 8–23)
CO2: 18 mmol/L — ABNORMAL LOW (ref 22–32)
Calcium: 10.2 mg/dL (ref 8.9–10.3)
Chloride: 110 mmol/L (ref 98–111)
Creatinine, Ser: 1.43 mg/dL — ABNORMAL HIGH (ref 0.61–1.24)
GFR, Estimated: 47 mL/min — ABNORMAL LOW (ref >60)
Glucose, Bld: 139 mg/dL — ABNORMAL HIGH (ref 70–99)
Phosphorus: 2.3 mg/dL — ABNORMAL LOW (ref 2.5–4.6)
Potassium: 4 mmol/L (ref 3.5–5.1)
Sodium: 137 mmol/L (ref 135–145)

## 2021-08-17 NOTE — Plan of Care (Signed)

## 2021-08-17 NOTE — Progress Notes (Signed)
PROGRESS NOTE    Seth Simpson  [id number] DOB: 07/15/1931 DOA: 08/13/2021 PCP: Josetta Huddle, MD    No chief complaint on file.   Brief Narrative:  Patient is a 87 year old gentleman history of coronary artery disease status post CABG, paroxysmal SVT on amiodarone, frequent PVCs, LBBB, type 2 diabetes, hypertension, hyperlipidemia, hypothyroidism, depression, BPH presented to the ED with concerns for dyspnea.  Work-up done including CT chest concerning for left upper lobe mass and moderate left-sided pleural effusion concerning for malignancy.  Patient admitted diagnostic and therapeutic thoracentesis ordered and currently pending.  PCCM consulted.   Assessment & Plan:   Principal Problem:   Acute respiratory failure with hypoxia (HCC) Active Problems:   Coronary artery disease   Hypertension   Hypercholesteremia   PSVT (paroxysmal supraventricular tachycardia) (HCC)   Type II diabetes mellitus (HCC)   Mass of upper lobe of left lung   Pleural effusion   1 acute respiratory failure with hypoxia secondary to left upper lobe/perihilar lung mass concerning for primary bronchogenic neoplasm and likely left malignant pleural effusion -Patient presented with dyspnea. -Chest x-ray done on admission with left base collapse/consolidation with left suprahilar fullness and moderate left effusion concerning for neoplasm. -CT chest done with 4.0 cm medial left upper lobe/perihilar mass, extending into the left hilum compatible with primary bronchogenic neoplasm.  Associated thoracic nodal mets.  Postobstructive opacity/atelectasis in the lingula and left lower lobe.  Moderate left pleural effusion favored to be malignant.  Emphysema.  Aortic atherosclerosis. -Patient afebrile.   -Leukocytosis fluctuating.   -Status post ultrasound-guided diagnostic and therapeutic thoracentesis with mesothelial cells seen, cytology pending.  -Continue scheduled duo nebs, Claritin, Protonix, Mucinex,  Dulera. -Flutter valve, incentive spirometry. -Pulmonary consulted and following, if thoracentesis and diagnostic patient may need diagnostic bronchoscopy. -Supportive care.     2.  Acute kidney injury -Felt likely secondary to prerenal azotemia due to recent GI losses of emesis and diarrhea in the setting of ACE inhibitor. -Last creatinine noted on epic was 1.00 on 07/26/2017. -Creatinine trending down and improving currently at 1.43.  -Urinalysis nitrite negative, leukocytes negative, negative for protein.  Urine sodium at 60.  Urine creatinine at 98.33.   -Continue to hold ACE inhibitor.   -Saline lock IV fluids.   -Monitor urine output.   -Follow.   3.  Hypercalcemia -In the setting of suspected malignancy. -Patient also noted to be on oral vitamin D which will not resume during the hospitalization. -Calcium levels trending down with hydration. -Check a PTH, ionized calcium, PTHrp -Saline lock IV fluids. -May need a dose of Zometa however will follow for now.  4.  Coronary artery disease status post CABG -Statin, Toprol-XL  5.  Paroxysmal SVTs and frequent PVCs -Amiodarone on hold due to acute pulmonary issues. -Heart rate currently controlled on Toprol-XL.   -We will continue to hold amiodarone for now.    6.  Rheumatoid arthritis -Continue to hold methotrexate. -Outpatient follow-up.  7.  Diabetes mellitus type 2 -Hemoglobin A1c 7.2. -CBG 168 this morning.   -Continue to hold oral hypoglycemic agents.   -SSI.  8.  Hypothyroidism -Continue Synthroid  9.  Depression -Zoloft  10.  Hypertension -Continue Toprol-XL.   -Continue to hold ACE inhibitor.   -Follow.    11.  Hyperlipidemia -Continue statin.    DVT prophylaxis: Lovenox. Code Status: DNR Family Communication: No family at bedside.  Disposition:   Status is: Inpatient  The patient will require care spanning > 2 midnights and should be  moved to inpatient because: Inpatient level of care  appropriate due to severity of illness  Dispo: The patient is from: Home              Anticipated d/c is to: SNF              Patient currently is not medically stable to d/c.   Difficult to place patient No       Consultants:  PCCM: Dr. Erin Fulling 08/14/2021  Procedures: CT chest 08/13/2021 Chest x-ray 08/13/2021, 08/14/2021 Ultrasound-guided diagnostic and therapeutic thoracentesis per IR 08/14/2021--1.3 L of amber fluid removed.   Antimicrobials:  None   Subjective: Sitting up in chair.  Complains of a little bit of shortness of breath today however overall significantly better than on admission.  No chest pain.  No abdominal pain.  Still awaiting results from thoracentesis.    Objective: Vitals:   08/17/21 0518 08/17/21 0746 08/17/21 0852 08/17/21 0953  BP: (!) 146/84 (!) 151/108 (!) 141/68   Pulse: 64 73 70   Resp: 20 (!) 24    Temp: 98 F (36.7 C) 98.5 F (36.9 C)    TempSrc: Oral Oral    SpO2: 93% 98%  95%  Weight:        Intake/Output Summary (Last 24 hours) at 08/17/2021 1026 Last data filed at 08/17/2021 0517 Gross per 24 hour  Intake 1696.83 ml  Output 1050 ml  Net 646.83 ml    Filed Weights   08/13/21 2200  Weight: 65.8 kg    Examination:  General exam: NAD. Respiratory system: Some decreased breath sounds in the left base.  No wheezing.  No crackles.  Fair air movement.  Speaking in full sentences. Cardiovascular system: RRR with 3/6 systolic ejection murmur.  No lower extremity edema.   Gastrointestinal system: Abdomen is soft, nontender, nondistended, positive bowel sounds.  No rebound.  No guarding.   Central nervous system: Alert and oriented. No focal neurological deficits. Extremities: Symmetric 5 x 5 power. Skin: No rashes, lesions or ulcers Psychiatry: Judgement and insight appear normal. Mood & affect appropriate.     Data Reviewed: I have personally reviewed following labs and imaging studies  CBC: Recent Labs  Lab 08/13/21 1227  08/14/21 0322 08/15/21 0740 08/16/21 0107 08/17/21 0116  WBC 11.0* 9.4 12.2* 11.9* 11.2*  NEUTROABS 8.8*  --   --  9.4* 8.9*  HGB 12.9* 11.8* 11.4* 11.6* 12.8*  HCT 39.8 36.6* 35.1* 34.9* 37.5*  MCV 109.6* 109.9* 108.3* 107.7* 107.1*  PLT 238 213 218 209 220     Basic Metabolic Panel: Recent Labs  Lab 08/13/21 1227 08/14/21 0322 08/15/21 0740 08/16/21 0107 08/17/21 0116  NA 137 136 138 138 137  K 4.3 4.3 4.0 4.0 4.0  CL 110 111 114* 115* 110  CO2 18* 19* 18* 17* 18*  GLUCOSE 145* 107* 124* 128* 139*  BUN 29* 26* 24* 22 19  CREATININE 1.78* 1.62* 1.62* 1.53* 1.43*  CALCIUM 11.5* 10.9* 10.6* 10.5* 10.2  PHOS  --   --  2.6 2.3* 2.3*     GFR: Estimated Creatinine Clearance: 33.2 mL/min (A) (by C-G formula based on SCr of 1.43 mg/dL (H)).  Liver Function Tests: Recent Labs  Lab 08/14/21 0322 08/15/21 0740 08/16/21 0107 08/17/21 0116  AST 31  --   --   --   ALT 27  --   --   --   ALKPHOS 85  --   --   --   BILITOT 0.9  --   --   --  PROT 5.5*  --   --   --   ALBUMIN 2.6* 2.3* 2.3* 2.3*     CBG: Recent Labs  Lab 08/16/21 0747 08/16/21 1213 08/16/21 1550 08/16/21 2317 08/17/21 0743  GLUCAP 162* 151* 140* 132* 168*      Recent Results (from the past 240 hour(s))  Resp Panel by RT-PCR (Flu A&B, Covid) Nasopharyngeal Swab     Status: None   Collection Time: 08/13/21  4:36 PM   Specimen: Nasopharyngeal Swab; Nasopharyngeal(NP) swabs in vial transport medium  Result Value Ref Range Status   SARS Coronavirus 2 by RT PCR NEGATIVE NEGATIVE Final    Comment: (NOTE) SARS-CoV-2 target nucleic acids are NOT DETECTED.  The SARS-CoV-2 RNA is generally detectable in upper respiratory specimens during the acute phase of infection. The lowest concentration of SARS-CoV-2 viral copies this assay can detect is 138 copies/mL. A negative result does not preclude SARS-Cov-2 infection and should not be used as the sole basis for treatment or other patient management  decisions. A negative result may occur with  improper specimen collection/handling, submission of specimen other than nasopharyngeal swab, presence of viral mutation(s) within the areas targeted by this assay, and inadequate number of viral copies(<138 copies/mL). A negative result must be combined with clinical observations, patient history, and epidemiological information. The expected result is Negative.  Fact Sheet for Patients:  EntrepreneurPulse.com.au  Fact Sheet for Healthcare Providers:  IncredibleEmployment.be  This test is no t yet approved or cleared by the [place] FDA and  has been authorized for detection and/or diagnosis of SARS-CoV-2 by FDA under an Emergency Use Authorization (EUA). This EUA will remain  in effect (meaning this test can be used) for the duration of the COVID-19 declaration under Section 564(b)(1) of the Act, 21 U.S.C.section 360bbb-3(b)(1), unless the authorization is terminated  or revoked sooner.       Influenza A by PCR NEGATIVE NEGATIVE Final   Influenza B by PCR NEGATIVE NEGATIVE Final    Comment: (NOTE) The Xpert Xpress SARS-CoV-2/FLU/RSV plus assay is intended as an aid in the diagnosis of influenza from Nasopharyngeal swab specimens and should not be used as a sole basis for treatment. Nasal washings and aspirates are unacceptable for Xpert Xpress SARS-CoV-2/FLU/RSV testing.  Fact Sheet for Patients: EntrepreneurPulse.com.au  Fact Sheet for Healthcare Providers: IncredibleEmployment.be  This test is not yet approved or cleared by the [place] FDA and has been authorized for detection and/or diagnosis of SARS-CoV-2 by FDA under an Emergency Use Authorization (EUA). This EUA will remain in effect (meaning this test can be used) for the duration of the COVID-19 declaration under Section 564(b)(1) of the Act, 21 U.S.C. section 360bbb-3(b)(1), unless the  authorization is terminated or revoked.  Performed at [facility], [place] [address], [place], [place] [address]   Body fluid culture w Gram Stain     Status: None (Preliminary result)   Collection Time: 08/14/21 11:59 AM   Specimen: Lung, Left; Pleural Fluid  Result Value Ref Range Status   Specimen Description FLUID  Final   Special Requests LUNG LEFT  Final   Gram Stain   Final    FEW WBC PRESENT, PREDOMINANTLY MONONUCLEAR NO ORGANISMS SEEN    Culture   Final    NO GROWTH 3 DAYS Performed at [facility], [place] [address] [place], [place] [address]    Report Status PENDING  Incomplete  Urine Culture     Status: None   Collection Time: 08/14/21  4:32 PM   Specimen: Urine, Clean Catch  Result Value Ref Range Status   Specimen Description URINE, CLEAN CATCH  Final   Special Requests NONE  Final   Culture   Final    NO GROWTH Performed at [facility], [address] [place], [place] [address]    Report Status 08/16/2021 FINAL  Final          Radiology Studies: No results found.      Scheduled Meds:  allopurinol  300 mg Oral QPM   artificial tears  1 application Both Eyes QHS   enoxaparin (LOVENOX) injection  30 mg Subcutaneous Q24H   ferrous sulfate  325 mg Oral QODAY   finasteride  5 mg Oral Daily   folic acid  3 mg Oral q morning   guaiFENesin  1,200 mg Oral BID   insulin aspart  0-9 Units Subcutaneous TID WC   levothyroxine  50 mcg Oral Q0600   loratadine  10 mg Oral q morning   metoprolol succinate  25 mg Oral Daily   mometasone-formoterol  2 puff Inhalation BID   pantoprazole  40 mg Oral Q0600   rosuvastatin  10 mg Oral Q M,W,F   sertraline  50 mg Oral q morning   sodium bicarbonate  650 mg Oral TID   sodium chloride  1 drop Both Eyes QID   sodium chloride flush  3 mL Intravenous Q12H   Continuous Infusions:  sodium chloride 100 mL/hr at 08/17/21 0523     LOS: 3 days    Time spent: 35 minutes    Irine Seal,  MD [facility]   To contact the attending provider between 7A-7P or the covering provider during after hours 7P-7A, please log into the web site [email or web address] and access using universal [place] password for that web site. If you do not have the password, please call the hospital operator.  08/17/2021, 10:26 AM

## 2021-08-17 NOTE — NC FL2 (Signed)
[place] LEVEL OF CARE SCREENING TOOL     IDENTIFICATION  Patient Name: Seth Simpson Birthdate: 03-28-1931 Sex: male Admission Date (Current Location): 08/13/2021  [facility] and [place] Number:  Herbalist and Address:  [facility] [place] [address] [place], [place] [address]      Provider Number: [id number]  Attending Physician Name and Address:  Eugenie Filler, MD  Relative Name and Phone Number:  Lendon Ka [phone]    Current Level of Care: SNF Recommended Level of Care: [place] Prior Approval Number:    Date Approved/Denied:   PASRR Number:    Discharge Plan: SNF    Current Diagnoses: Patient Active Problem List   Diagnosis Date Noted   Acute respiratory failure with hypoxia ([place]) 08/13/2021   Mass of upper lobe of left lung 08/13/2021   Pleural effusion 08/13/2021   Supraventricular tachycardia ([place])    Myocardial infarction ([place])    Type II diabetes mellitus ([place])    Sinus headache    PVC's (premature ventricular contractions)    Left anterior fascicular block    PSVT (paroxysmal supraventricular tachycardia) ([place]) 04/22/2015   High risk medication use 04/22/2015   Aortic stenosis 04/22/2015   LAFB (left anterior fascicular block) 04/22/2015   SVT (supraventricular tachycardia) ([place]) 11/20/2014   PVC (premature ventricular contraction) 10/22/2014   Ventricular bigeminy 10/22/2014   Mild aortic stenosis 10/22/2014   Rheumatoid arthritis ([place]) 10/02/2013   Coronary artery disease    Old MI (myocardial infarction)    Hypertension    Heart murmur    Anemia    GERD (gastroesophageal reflux disease)    Arthritis    ASVD (arteriosclerotic vascular disease)    Hypercholesteremia    Decreased libido    Orchitis and epididymitis    Prostatitis    BPH (benign prostatic hyperplasia)    Bradycardia    Syncope    Bicuspid aortic valve     Orientation RESPIRATION BLADDER Height &  Weight     Self, Situation, Place  O2 (2L) Incontinent (existing external catheter) Weight: 65.8 kg Height:     BEHAVIORAL SYMPTOMS/MOOD NEUROLOGICAL BOWEL NUTRITION STATUS     (n/a) Incontinent Diet  AMBULATORY STATUS COMMUNICATION OF NEEDS Skin   Limited Assist Verbally Normal                       Personal Care Assistance Level of Assistance  Bathing, Feeding, Dressing, Total care Bathing Assistance: Limited assistance Feeding assistance: Limited assistance Dressing Assistance: Limited assistance Total Care Assistance: Limited assistance   Functional Limitations Info  Sight, Hearing, Speech Sight Info: Impaired Hearing Info: Impaired Speech Info: Adequate    SPECIAL CARE FACTORS FREQUENCY  PT (By licensed PT), OT (By licensed OT)     PT Frequency: 5X OT Frequency: 5X            Contractures Contractures Info: Not present    Additional Factors Info  Code Status, Allergies, Psychotropic, Insulin Sliding Scale, Isolation Precautions, Suctioning Needs Code Status Info: DNR Allergies Info: Peanut Allergen Powder-dnfp, Peanut-containing Drug Products, Hydrochlorothiazide, Other, Pistachio Nut (Diagnostic), Codeine, Fruit & Vegetable Daily (Nutritional Supplements) Psychotropic Info: see d/c summary for psychotropic information Insulin Sliding Scale Info: see d/c summary for sliding scale info Isolation Precautions Info: n/a Suctioning Needs: n/a   Current Medications (08/17/2021):  This is the current hospital active medication list Current Facility-Administered Medications  Medication Dose Route Frequency Provider Last Rate Last  Admin   acetaminophen (TYLENOL) tablet 650 mg  650 mg Oral Q6H PRN Lenore Cordia, MD       Or   acetaminophen (TYLENOL) suppository 650 mg  650 mg Rectal Q6H PRN Lenore Cordia, MD       acetaminophen (TYLENOL) tablet 500 mg  500 mg Oral Q6H PRN Eugenie Filler, MD       allopurinol (ZYLOPRIM) tablet 300 mg  300 mg Oral QPM  Eugenie Filler, MD   300 mg at 08/16/21 1621   artificial tears (LACRILUBE) ophthalmic ointment 1 application  1 application Both Eyes QHS Eugenie Filler, MD   1 application at [id number]   enoxaparin (LOVENOX) injection 30 mg  30 mg Subcutaneous Q24H Zada Finders R, MD   30 mg at 08/16/21 2115   ferrous sulfate tablet 325 mg  325 mg Oral Neville Route, MD   325 mg at 08/16/21 [id number]   finasteride (PROSCAR) tablet 5 mg  5 mg Oral Daily Zada Finders R, MD   5 mg at [id number]   folic acid (FOLVITE) tablet 3 mg  3 mg Oral q morning Eugenie Filler, MD   3 mg at 08/17/21 0900   guaiFENesin (MUCINEX) 12 hr tablet 1,200 mg  1,200 mg Oral BID Eugenie Filler, MD   1,200 mg at 08/17/21 0849   insulin aspart (novoLOG) injection 0-9 Units  0-9 Units Subcutaneous TID WC Zada Finders R, MD   2 Units at 08/17/21 1216   ipratropium-albuterol (DUONEB) 0.5-2.5 (3) MG/3ML nebulizer solution 3 mL  3 mL Nebulization Q6H PRN Eugenie Filler, MD       levothyroxine (SYNTHROID) tablet 50 mcg  50 mcg Oral Q0600 Lenore Cordia, MD   50 mcg at 08/17/21 0515   lidocaine (PF) (XYLOCAINE) 1 % injection    PRN Docia Barrier, PA   10 mL at 08/14/21 1135   loratadine (CLARITIN) tablet 10 mg  10 mg Oral q morning Eugenie Filler, MD   10 mg at 08/17/21 0900   metoprolol succinate (TOPROL-XL) 24 hr tablet 25 mg  25 mg Oral Daily Eugenie Filler, MD   25 mg at 08/17/21 0852   mometasone-formoterol (DULERA) 200-5 MCG/ACT inhaler 2 puff  2 puff Inhalation BID Eugenie Filler, MD   2 puff at 08/17/21 0804   ondansetron (ZOFRAN) tablet 4 mg  4 mg Oral Q6H PRN Lenore Cordia, MD       Or   ondansetron (ZOFRAN) injection 4 mg  4 mg Intravenous Q6H PRN Lenore Cordia, MD       pantoprazole (PROTONIX) EC tablet 40 mg  40 mg Oral Q0600 Eugenie Filler, MD   40 mg at 08/17/21 0515   rosuvastatin (CRESTOR) tablet 10 mg  10 mg Oral Q M,W,F Zada Finders R, MD   10 mg at 08/17/21  0850   sertraline (ZOLOFT) tablet 50 mg  50 mg Oral q morning Zada Finders R, MD   50 mg at 08/17/21 0900   sodium bicarbonate tablet 650 mg  650 mg Oral TID Eugenie Filler, MD   650 mg at 08/17/21 0848   sodium chloride (MURO 128) 5 % ophthalmic solution 1 drop  1 drop Both Eyes QID Eugenie Filler, MD   1 drop at 08/17/21 0854   sodium chloride flush (NS) 0.9 % injection 3 mL  3 mL Intravenous Q12H Lenore Cordia, MD   3  mL at 08/17/21 0857     Discharge Medications: Please see discharge summary for a list of discharge medications.  Relevant Imaging Results:  Relevant Lab Results:   Additional Information SS# [id number] [place] COVID-19 Vaccine 01/25/2020 , 01/04/2020  Angelita Ingles, RN

## 2021-08-17 NOTE — TOC Progression Note (Addendum)
Transition of Care [facility]) - Progression Note    Patient Details  Name: Seth Simpson MRN: [id number] Date of Birth: [birth year]  Transition of Care [facility] CM/SW [place], RN Phone Number:[phone]  08/17/2021, 1:07 PM  Clinical Narrative:    CM at bed side to discuss disposition. Patient and daughter both agreeable to SNF placement for rehab. List of choices given to daughter. FL2 completed and info faxed out via the [facility]. Will await bed offers. Can not initiate insurance auth due to holiday. Will initiate insurance auth tomorrow.         Expected Discharge Plan and Services                                                 Social Determinants of Health (SDOH) Interventions    Readmission Risk Interventions No flowsheet data found.

## 2021-08-17 NOTE — Evaluation (Signed)
Occupational Therapy Evaluation Patient Details Name: Seth Simpson MRN: [id number] DOB: 1931/12/08 Today's Date: 08/17/2021    History of Present Illness 87 y/o presented to ED on 9/1 for concerns of dyspnea. CT chest concerning of L upper lobe mass and moderate L sided pleural effusion concerning for malignancy. Patient underwent diagnostic and therapeutic thoracentesis. PMH: CAD s/p CABG, paroxysmal SVT on amiodarone, frequent PVCs, LBBB, type 2 diabetes, HTN, HLD, hypothyroidism, depression, BPH   Clinical Impression   Patient admitted for the diagnosis above.  PTA he lives alone, with PRN assist from daughter for meds, community mobility and home management.  Deficits impacting independence are listed below.  Currently he is needing up to [place] a for basic mobility, with increased SOB noted, and up to Mod A for lower body ADL from a sit/stand level.  OT will follow in the acute setting to maximize function, but SNF is recommended, as he does not have the needed 24 hour assist at home.      Follow Up Recommendations  SNF    Equipment Recommendations  None recommended by OT    Recommendations for Other Services       Precautions / Restrictions Precautions Precautions: Fall Precaution Comments: on 3L O2 [place] Restrictions Weight Bearing Restrictions: No      Mobility Bed Mobility Overal bed mobility: Needs Assistance Bed Mobility: Supine to Sit     Supine to sit: Min assist       Patient Response: Cooperative  Transfers Overall transfer level: Needs assistance Equipment used: Rolling walker (2 wheeled) Transfers: Sit to/from [facility] Sit to Stand: Min assist Stand pivot transfers: Min guard            Balance Overall balance assessment: Needs assistance Sitting-balance support: No upper extremity supported;Feet supported Sitting balance-Leahy Scale: Fair     Standing balance support: Bilateral upper extremity supported;During functional  activity Standing balance-Leahy Scale: Poor Standing balance comment: reliant on UE support and external assist                           ADL either performed or assessed with clinical judgement   ADL   Eating/Feeding: Independent;Bed level   Grooming: Wash/dry hands;Wash/dry face;Supervision/safety;Sitting       Lower Body Bathing: Moderate assistance;Sit to/from stand       Lower Body Dressing: Moderate assistance;Sit to/from stand   Toilet Transfer: Minimal assistance;RW;Squat-pivot;BSC                   Vision Baseline Vision/History: 1 Wears glasses Patient Visual Report: No change from baseline       Perception     Praxis      Pertinent Vitals/Pain Pain Assessment: No/denies pain     Hand Dominance Right   Extremity/Trunk Assessment Upper Extremity Assessment Upper Extremity Assessment: Generalized weakness   Lower Extremity Assessment Lower Extremity Assessment: Defer to PT evaluation   Cervical / Trunk Assessment Cervical / Trunk Assessment: Kyphotic   Communication Communication Communication: HOH   Cognition Arousal/Alertness: Awake/alert Behavior During Therapy: WFL for tasks assessed/performed Overall Cognitive Status: Within Functional Limits for tasks assessed                                      Home Living Family/patient expects to be discharged to:: Private residence Living Arrangements: Alone Available Help at Discharge: Family;Available PRN/intermittently Type of  Home: House Home Access: [place]: One level     Bathroom Shower/Tub: Occupational psychologist: Handicapped height     Home Equipment: Environmental consultant - 2 wheels;Wheelchair - manual          Prior Functioning/Environment Level of Independence: Needs assistance  Gait / Transfers Assistance Needed: ambulates with RW in home independently but short distances <20'. Reports x1 fall in past month. Does not  drive. Uses w/c for community mobility. Daughter drives him to appointments ADL's / Homemaking Assistance Needed: daughter does grocery shopping. Patient reports difficulty dressing but "manages". Patient sponge bathes and "gets the important parts"            OT Problem List: Decreased strength;Decreased activity tolerance;Impaired balance (sitting and/or standing);Decreased knowledge of use of DME or AE      OT Treatment/Interventions: Self-care/ADL training;Therapeutic exercise;Energy conservation;Therapeutic activities;Balance training    OT Goals(Current goals can be found in the care plan section) Acute Rehab OT Goals Patient Stated Goal: I need to get a little stronger OT Goal Formulation: With patient Time For Goal Achievement: 08/31/21 Potential to Achieve Goals: Good ADL Goals Pt Will Perform Grooming: with set-up;sitting;standing Pt Will Perform Lower Body Bathing: with set-up;sit to/from stand Pt Will Perform Lower Body Dressing: with set-up;sit to/from stand Pt Will Transfer to Toilet: with supervision;ambulating;regular height toilet Pt Will Perform Toileting - Clothing Manipulation and hygiene: with supervision;sit to/from stand Pt/caregiver will Perform Home Exercise Program: Increased strength;Both right and left upper extremity;With theraband;With written HEP provided;With Supervision  OT Frequency: Min 2X/week   Barriers to D/C: Decreased caregiver support          Co-evaluation              AM-PAC OT "6 Clicks" Daily Activity     Outcome Measure Help from another person eating meals?: None Help from another person taking care of personal grooming?: A Little Help from another person toileting, which includes using toliet, bedpan, or urinal?: A Lot Help from another person bathing (including washing, rinsing, drying)?: A Lot Help from another person to put on and taking off regular upper body clothing?: A Little Help from another person to put on and  taking off regular lower body clothing?: A Lot 6 Click Score: 16   End of Session Equipment Utilized During Treatment: Rolling walker;Oxygen Nurse Communication: Mobility status  Activity Tolerance: Patient limited by fatigue Patient left: in chair;with call bell/phone within reach  OT Visit Diagnosis: Unsteadiness on feet (R26.81);Muscle weakness (generalized) (M62.81)                Time: [id number] OT Time Calculation (min): 24 min Charges:  OT General Charges $OT Visit: 1 Visit OT Evaluation $OT Eval Moderate Complexity: 1 Mod OT Treatments $Self Care/Home Management : 8-22 mins  08/17/2021  RP, OTR/L  Acute Rehabilitation Services  Office:  [phone]   Metta Clines 08/17/2021, 9:56 AM

## 2021-08-18 DIAGNOSIS — I10 Essential (primary) hypertension: Secondary | ICD-10-CM | POA: Diagnosis not present

## 2021-08-18 DIAGNOSIS — I2510 Atherosclerotic heart disease of native coronary artery without angina pectoris: Secondary | ICD-10-CM | POA: Diagnosis not present

## 2021-08-18 DIAGNOSIS — R918 Other nonspecific abnormal finding of lung field: Secondary | ICD-10-CM | POA: Diagnosis not present

## 2021-08-18 DIAGNOSIS — J9601 Acute respiratory failure with hypoxia: Secondary | ICD-10-CM | POA: Diagnosis not present

## 2021-08-18 LAB — CBC WITH DIFFERENTIAL/PLATELET
Abs Immature Granulocytes: 0.07 10*3/uL (ref 0.00–0.07)
Basophils Absolute: 0 10*3/uL (ref 0.0–0.1)
Basophils Relative: 0 %
Eosinophils Absolute: 0.6 10*3/uL — ABNORMAL HIGH (ref 0.0–0.5)
Eosinophils Relative: 5 %
HCT: 37.1 % — ABNORMAL LOW (ref 39.0–52.0)
Hemoglobin: 12.2 g/dL — ABNORMAL LOW (ref 13.0–17.0)
Immature Granulocytes: 1 %
Lymphocytes Relative: 5 %
Lymphs Abs: 0.6 10*3/uL — ABNORMAL LOW (ref 0.7–4.0)
MCH: 35.1 pg — ABNORMAL HIGH (ref 26.0–34.0)
MCHC: 32.9 g/dL (ref 30.0–36.0)
MCV: 106.6 fL — ABNORMAL HIGH (ref 80.0–100.0)
Monocytes Absolute: 1.2 10*3/uL — ABNORMAL HIGH (ref 0.1–1.0)
Monocytes Relative: 10 %
Neutro Abs: 9.5 10*3/uL — ABNORMAL HIGH (ref 1.7–7.7)
Neutrophils Relative %: 79 %
Platelets: 226 10*3/uL (ref 150–400)
RBC: 3.48 MIL/uL — ABNORMAL LOW (ref 4.22–5.81)
RDW: 14.6 % (ref 11.5–15.5)
WBC: 12 10*3/uL — ABNORMAL HIGH (ref 4.0–10.5)
nRBC: 0 % (ref 0.0–0.2)

## 2021-08-18 LAB — RENAL FUNCTION PANEL
Albumin: 2.3 g/dL — ABNORMAL LOW (ref 3.5–5.0)
Anion gap: 5 (ref 5–15)
BUN: 18 mg/dL (ref 8–23)
CO2: 19 mmol/L — ABNORMAL LOW (ref 22–32)
Calcium: 10.3 mg/dL (ref 8.9–10.3)
Chloride: 113 mmol/L — ABNORMAL HIGH (ref 98–111)
Creatinine, Ser: 1.37 mg/dL — ABNORMAL HIGH (ref 0.61–1.24)
GFR, Estimated: 50 mL/min — ABNORMAL LOW (ref >60)
Glucose, Bld: 116 mg/dL — ABNORMAL HIGH (ref 70–99)
Phosphorus: 2.1 mg/dL — ABNORMAL LOW (ref 2.5–4.6)
Potassium: 3.8 mmol/L (ref 3.5–5.1)
Sodium: 137 mmol/L (ref 135–145)

## 2021-08-18 LAB — BODY FLUID CULTURE W GRAM STAIN: Culture: NO GROWTH

## 2021-08-18 LAB — GLUCOSE, CAPILLARY
Glucose-Capillary: 150 mg/dL — ABNORMAL HIGH (ref 70–99)
Glucose-Capillary: 138 mg/dL — ABNORMAL HIGH (ref 70–99)
Glucose-Capillary: 166 mg/dL — ABNORMAL HIGH (ref 70–99)
Glucose-Capillary: 189 mg/dL — ABNORMAL HIGH (ref 70–99)

## 2021-08-18 LAB — PH, BODY FLUID: pH, Body Fluid: 7.5

## 2021-08-18 LAB — MAGNESIUM: Magnesium: 1.4 mg/dL — ABNORMAL LOW (ref 1.7–2.4)

## 2021-08-18 MED ORDER — ZOLEDRONIC ACID 4 MG/5ML IV CONC
4.0000 mg | Freq: Once | INTRAVENOUS | Status: AC
  Administered 2021-08-18: 4 mg via INTRAVENOUS
  Filled 2021-08-18: qty 5

## 2021-08-18 MED ORDER — MAGNESIUM SULFATE 4 GM/100ML IV SOLN
4.0000 g | Freq: Once | INTRAVENOUS | Status: AC
  Administered 2021-08-18: 4 g via INTRAVENOUS
  Filled 2021-08-18: qty 100

## 2021-08-18 NOTE — TOC Progression Note (Signed)
Transition of Care [facility]) - Progression Note    Patient Details  Name: Seth Simpson MRN: [id number] Date of Birth: 1931-08-23  Transition of Care [facility]) CM/SW [place], RN Phone Number:[phone]  08/18/2021, 2:47 PM  Clinical Narrative:    CM called daughter Diane to discuss bed offers. CM provided daughter with list of facilities that have made a bed offer for the patient. Daughter states that all offers have a 2 star medicare rating. Daughter expresses interest in the possibility of bringing her father home with 24 hour care. At this time daughter has not made a decision and would like to review offers and explore options for bringing the patient home. TOC will continue to follow.         Expected Discharge Plan and Services                                                 Social Determinants of Health (SDOH) Interventions    Readmission Risk Interventions No flowsheet data found.

## 2021-08-18 NOTE — Progress Notes (Addendum)
Physical Therapy Treatment Patient Details Name: Seth Simpson MRN: [id number] DOB: 11-23-1931 Today's Date: 08/18/2021    History of Present Illness 87 y/o presented to ED on 9/1 for concerns of dyspnea. CT chest concerning of L upper lobe mass and moderate L sided pleural effusion concerning for malignancy. Patient underwent diagnostic and therapeutic thoracentesis. PMH: CAD s/p CABG, paroxysmal SVT on amiodarone, frequent PVCs, LBBB, type 2 diabetes, HTN, HLD, hypothyroidism, depression, BPH    PT Comments    Pt up in chair upon PT arrival to room, agreeable to gait training. Pt ambulatory x20 ft with RW, overall requiring min-mod assist for correcting posterior bias and steadying. Pt reports he feels weaker today, but tolerated LE strengthening exercises well. Will continue to follow.  SpO2 86-94% on RA during gait, recovered from 86% within seconds on RA   Follow Up Recommendations  SNF;Supervision/Assistance - 24 hour (pt's daughter may refuse SNF, increasing frequency and will require HHPT if d/c home)     Equipment Recommendations  None recommended by PT    Recommendations for Other Services       Precautions / Restrictions Precautions Precautions: Fall Precaution Comments: watch sats - on RA today Restrictions Weight Bearing Restrictions: No    Mobility  Bed Mobility               General bed mobility comments: up in recliner    Transfers Overall transfer level: Needs assistance Equipment used: Rolling walker (2 wheeled) Transfers: Sit to/from Stand Sit to Stand: Mod assist         General transfer comment: mod assist to rise from recliner for initial power up, rise, steadying. Verbal cuing for hand placement.  Ambulation/Gait Ambulation/Gait assistance: Min assist Gait Distance (Feet): 20 Feet Assistive device: Rolling walker (2 wheeled) Gait Pattern/deviations: Step-through pattern;Decreased stride length;Shuffle;Trunk flexed Gait velocity:  decr   General Gait Details: min assist to steady, correct posterior bias. Cues for upright posture, standing in midline as opposed to posterior leaning. Very increased time, SpO2 86% on RA briefly but rebounded to 89-92% on RA.   Stairs             Wheelchair Mobility    Modified Rankin (Stroke Patients Only)       Balance Overall balance assessment: Needs assistance Sitting-balance support: No upper extremity supported;Feet supported Sitting balance-Leahy Scale: Fair     Standing balance support: Bilateral upper extremity supported;During functional activity Standing balance-Leahy Scale: Poor Standing balance comment: reliant on UE support and external assist                            Cognition Arousal/Alertness: Awake/alert Behavior During Therapy: WFL for tasks assessed/performed Overall Cognitive Status: Within Functional Limits for tasks assessed                                        Exercises General Exercises - Lower Extremity Long Arc Quad: AROM;Both;10 reps;Seated Hip Flexion/Marching: AROM;Both;10 reps;Seated    General Comments General comments (skin integrity, edema, etc.): SpO2 86-94% on RA during gait      Pertinent Vitals/Pain Pain Assessment: Faces Faces Pain Scale: No hurt Pain Intervention(s): Monitored during session    Home Living                      Prior Function  PT Goals (current goals can now be found in the care plan section) Acute Rehab PT Goals Patient Stated Goal: I need to get a little stronger PT Goal Formulation: With patient Time For Goal Achievement: 08/29/21 Potential to Achieve Goals: Fair Progress towards PT goals: Progressing toward goals    Frequency    Min 3X/week      PT Plan Current plan remains appropriate    Co-evaluation              AM-PAC PT "6 Clicks" Mobility   Outcome Measure  Help needed turning from your back to your side while in  a flat bed without using bedrails?: A Little Help needed moving from lying on your back to sitting on the side of a flat bed without using bedrails?: A Little Help needed moving to and from a bed to a chair (including a wheelchair)?: A Lot Help needed standing up from a chair using your arms (e.g., wheelchair or bedside chair)?: A Lot Help needed to walk in hospital room?: A Little Help needed climbing 3-5 steps with a railing? : A Lot 6 Click Score: 15    End of Session Equipment Utilized During Treatment: Gait belt Activity Tolerance: Patient tolerated treatment well Patient left: in chair;with chair alarm set;with call bell/phone within reach Nurse Communication: Mobility status PT Visit Diagnosis: Unsteadiness on feet (R26.81);Muscle weakness (generalized) (M62.81);History of falling (Z91.81);Difficulty in walking, not elsewhere classified (R26.2)     Time: [id number] PT Time Calculation (min) (ACUTE ONLY): 26 min  Charges:  $Gait Training: 8-22 mins $Therapeutic Exercise: 8-22 mins                    Stacie Glaze, PT DPT Acute Rehabilitation Services Pager [phone]  Office [phone]    [place] 08/18/2021, 4:36 PM

## 2021-08-18 NOTE — Progress Notes (Signed)
NAME:  Seth Simpson, MRN:  [id number], DOB:  September 15, 1931, LOS: 4 ADMISSION DATE:  08/13/2021, CONSULTATION DATE:  08/14/21 REFERRING MD:  Irine Seal, MD CHIEF COMPLAINT:  Lung Mass, Pleural Effusion   History of Present Illness:  Seth Simpson is an 87 year old male with CAD s/p CABG, DMII, hypertension, and hyperlipidemia who presented to the ER on 08/13/21 with shortness of breath.   The dyspnea was progressive over the past 2 weeks. Initial chest radiograph showed left pleural effusion and left hilar fullness. CT chest with contrast was then performed which showed 4cm left upper lobe/perihilar mass extending into the left hilum. Moderate to large left pleural effusion. He underwent thoracentesis by IR 9/2 with removal of 1.3L of amber fluid.   He is a former smoker, he quit at age 35 and has a 12 pack year smoking history. 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 He denies any harmful dust or chemical exposures. Denies any asbestos exposures.  He reports his dyspnea is improved after the thoracentesis. He denies any cough or wheezing. Denies any hemoptysis. He has a 10lbs weight loss over recent months and his appetite has decreased. His daughter was at the bedside.  Pertinent  Medical History  Coronary Artery Disease GERD Hypertension Hyperlipidemia Rheumatoid arthritis Diabetes Mellitus Type II  Significant Hospital Events: Including procedures, antibiotic start and stop dates in addition to other pertinent events   Admitted 9/1 for dyspnea, CT chest notable for left hilar mass and left pleural effusion 9/2 Thoracentesis with IR Cytology pending  Interim History / Subjective:   No acute issues. Patient reports breathing is ok with no other issues at this time.  Objective   Blood pressure 136/80, pulse 66, temperature (!) 97.4 F (36.3 C), temperature source Oral, resp. rate 16, weight 65.8 kg,  SpO2 95 %.        Intake/Output Summary (Last 24 hours) at 08/18/2021 1640 Last data filed at 08/18/2021 0431 Gross per 24 hour  Intake --  Output 900 ml  Net -900 ml   Filed Weights   08/13/21 2200  Weight: 65.8 kg    Examination: General: Elderly male, thin, no acute distress, sitting up in chair HENT: [place]/AT, sclera anicteric, moist mucous membranes, No LAD or JVD noted Lungs: Clear to auscultation. Diminished left base, No wheezing or rhonchi. Cardiovascular: rrr, systolic murmur Abdomen: soft, non-tender, non-distended, bowel sounds present Extremities: warm, trace edema, no obvious deformities Neuro: alert, moving all extremities, hard of hearing GU: deferred  Resolved Hospital Problem list     Assessment & Plan:  Acute Hypoxemic Respiratory Failure Left Pleural Effusion Left Hilar Mass Emphysema  - Continue supplemental oxygen as needed to maintain SpO2 90-92% - Continue inhaler therapy - Pleural fluid cytology studies still pending.  - Will scheduled patient for repeat thoracentesis tomorrow at 2pm in case pleural fluid is non-diagnostic in order to try to obtain diagnosis via a more conservative route via pleural fluid. If no diagnosis obtained via pleural fluid sampling then will discuss moving forward with bronchoscopy and EBUS.   PCCM will continue to follow.  Labs   CBC: Recent Labs  Lab 08/13/21 1227 08/14/21 0322 08/15/21 0740 08/16/21 0107 08/17/21 0116 08/18/21 0124  WBC 11.0* 9.4 12.2* 11.9* 11.2* 12.0*  NEUTROABS 8.8*  --   --  9.4* 8.9* 9.5*  HGB 12.9* 11.8* 11.4* 11.6* 12.8* 12.2*  HCT  39.8 36.6* 35.1* 34.9* 37.5* 37.1*  MCV 109.6* 109.9* 108.3* 107.7* 107.1* 106.6*  PLT 238 213 218 209 220 [id number]    Basic Metabolic Panel: Recent Labs  Lab 08/14/21 0322 08/15/21 0740 08/16/21 0107 08/17/21 0116 08/18/21 0124  NA 136 138 138 137 137  K 4.3 4.0 4.0 4.0 3.8  CL 111 114* 115* 110 113*  CO2 19* 18* 17* 18* 19*  GLUCOSE 107* 124* 128*  139* 116*  BUN 26* 24* '22 19 18  '$ CREATININE 1.62* 1.62* 1.53* 1.43* 1.37*  CALCIUM 10.9* 10.6* 10.5* 10.2 10.3  MG  --   --   --   --  1.4*  PHOS  --  2.6 2.3* 2.3* 2.1*   GFR: Estimated Creatinine Clearance: 34.7 mL/min (A) (by C-G formula based on SCr of 1.37 mg/dL (H)). Recent Labs  Lab 08/15/21 0740 08/16/21 0107 08/17/21 0116 08/18/21 0124  WBC 12.2* 11.9* 11.2* 12.0*    Liver Function Tests: Recent Labs  Lab 08/14/21 0322 08/15/21 0740 08/16/21 0107 08/17/21 0116 08/18/21 0124  AST 31  --   --   --   --   ALT 27  --   --   --   --   ALKPHOS 85  --   --   --   --   BILITOT 0.9  --   --   --   --   PROT 5.5*  --   --   --   --   ALBUMIN 2.6* 2.3* 2.3* 2.3* 2.3*   No results for input(s): LIPASE, AMYLASE in the last 168 hours. No results for input(s): AMMONIA in the last 168 hours.  ABG    Component Value Date/Time   PHART 7.380 04/11/2008 2044   PCO2ART 37.0 04/11/2008 2044   PO2ART 122.0 (H) 04/11/2008 2044   HCO3 21.8 04/11/2008 2044   TCO2 22 07/26/2017 0715   ACIDBASEDEF 3.0 (H) 04/11/2008 2044   O2SAT 99.0 04/11/2008 2044     Coagulation Profile: No results for input(s): INR, PROTIME in the last 168 hours.  Cardiac Enzymes: No results for input(s): CKTOTAL, CKMB, CKMBINDEX, TROPONINI in the last 168 hours.  HbA1C: Hgb A1c MFr Bld  Date/Time Value Ref Range Status  08/14/2021 03:22 AM 7.2 (H) 4.8 - 5.6 % Final    Comment:    (NOTE) Pre diabetes:          5.7%-6.4%  Diabetes:              >6.4%  Glycemic control for   <7.0% adults with diabetes   04/11/2008 03:43 AM   Final   5.7 (NOTE)   The ADA recommends the following therapeutic goals for glycemic   control related to Hgb A1C measurement:   Goal of Therapy:   < 7.0% Hgb A1C   Action Suggested:  > 8.0% Hgb A1C   Ref:  Diabetes Care, 22, Suppl. 1, 1999    CBG: Recent Labs  Lab 08/17/21 1605 08/17/21 2105 08/18/21 0801 08/18/21 1153 08/18/21 1612  GLUCAP 142* 210* 150* 138* 166*     Critical care time: n/a    Freda Jackson, MD [facility] Office: [phone]   See Amion for personal pager PCCM on call pager [phone] until 7pm. Please call Elink 7p-7a. [phone]

## 2021-08-18 NOTE — Plan of Care (Signed)
  Problem: Skin Integrity: Goal: Risk for impaired skin integrity will decrease Outcome: Progressing  Staff will continue to turn and reposition as needed as well as giving skin care.

## 2021-08-18 NOTE — Care Management Important Message (Signed)
Important Message  Patient Details  Name: Seth Simpson MRN: [id number] Date of Birth: [birth year]   Medicare Important Message Given:  Yes     [name] 08/18/2021, 2:47 PM

## 2021-08-18 NOTE — Progress Notes (Signed)
PROGRESS NOTE    Seth Simpson  [id number] DOB: [birth year] DOA: 08/13/2021 PCP: Josetta Huddle, MD    No chief complaint on file.   Brief Narrative:  Patient is a 87 year old gentleman history of coronary artery disease status post CABG, paroxysmal SVT on amiodarone, frequent PVCs, LBBB, type 2 diabetes, hypertension, hyperlipidemia, hypothyroidism, depression, BPH presented to the ED with concerns for dyspnea.  Work-up done including CT chest concerning for left upper lobe mass and moderate left-sided pleural effusion concerning for malignancy.  Patient admitted diagnostic and therapeutic thoracentesis done with cytology pending.  PCCM consulted and following.    Assessment & Plan:   Principal Problem:   Acute respiratory failure with hypoxia (HCC) Active Problems:   Coronary artery disease   Hypertension   Hypercholesteremia   PSVT (paroxysmal supraventricular tachycardia) (HCC)   Type II diabetes mellitus (HCC)   Mass of upper lobe of left lung   Pleural effusion   1 acute respiratory failure with hypoxia secondary to left upper lobe/perihilar lung mass concerning for primary bronchogenic neoplasm and likely left malignant pleural effusion -Patient presented with dyspnea. -Chest x-ray done on admission with left base collapse/consolidation with left suprahilar fullness and moderate left effusion concerning for neoplasm. -CT chest done with 4.0 cm medial left upper lobe/perihilar mass, extending into the left hilum compatible with primary bronchogenic neoplasm.  Associated thoracic nodal mets.  Postobstructive opacity/atelectasis in the lingula and left lower lobe.  Moderate left pleural effusion favored to be malignant.  Emphysema.  Aortic atherosclerosis. -Patient afebrile.   -Leukocytosis fluctuating.   -Status post ultrasound-guided diagnostic and therapeutic thoracentesis with mesothelial cells seen, cytology pending.  -Continue scheduled duo nebs, Claritin, Protonix,  Mucinex, Dulera. -Flutter valve, incentive spirometry. -Pulmonary consulted and following, if thoracentesis and diagnostic patient may need diagnostic bronchoscopy. -Supportive care.     2.  Acute kidney injury -Felt likely secondary to prerenal azotemia due to recent GI losses of emesis and diarrhea in the setting of ACE inhibitor. -Last creatinine noted on epic was 1.00 on 07/26/2017. -Creatinine trending down and improving currently at 1.37.  -Urinalysis nitrite negative, leukocytes negative, negative for protein.  Urine sodium at 60.  Urine creatinine at 98.33.   -Continue to hold ACE inhibitor.   -Saline lock IV fluids.   -Monitor urine output.   -Follow.   3.  Hypercalcemia -In the setting of suspected malignancy. -Patient also noted to be on oral vitamin D which will not resume during the hospitalization. -Calcium levels trending down with hydration. -Corrected calcium at 11.66 -PTH, ionized calcium, PTHrp pending. -IV fluids have been saline locked. -We will give a dose of Zometa.   4.  Coronary artery disease status post CABG -Continue Toprol-XL, statin.    5.  Paroxysmal SVTs and frequent PVCs -Amiodarone on hold due to acute pulmonary issues. -Heart rate currently controlled on Toprol-XL.   -We will continue to hold amiodarone for now.    6.  Rheumatoid arthritis -Continue to hold methotrexate. -Outpatient follow-up.  7.  Diabetes mellitus type 2 -Hemoglobin A1c 7.2. -CBG 150 this morning.   -Oral hypoglycemic agents on hold.   -SSI.    8.  Hypothyroidism - Synthroid  9.  Depression -Continue Zoloft  10.  Hypertension -Controlled on current regimen of Toprol-XL.   -Continue to hold ACE inhibitor.   -Follow.  11.  Hyperlipidemia -Statin.  12.  Hypomagnesemia -Magnesium sulfate 4 g IV x1.    DVT prophylaxis: Lovenox. Code Status: DNR Family Communication: Updated daughter at  bedside.    Disposition:   Status is: Inpatient  The patient will  require care spanning > 2 midnights and should be moved to inpatient because: Inpatient level of care appropriate due to severity of illness  Dispo: The patient is from: Home              Anticipated d/c is to: SNF              Patient currently is not medically stable to d/c.   Difficult to place patient No       Consultants:  PCCM: Dr. Erin Fulling 08/14/2021  Procedures: CT chest 08/13/2021 Chest x-ray 08/13/2021, 08/14/2021 Ultrasound-guided diagnostic and therapeutic thoracentesis per IR 08/14/2021--1.3 L of amber fluid removed.   Antimicrobials:  None   Subjective: Laying in bed.  Denies any chest pain.  No significant shortness of breath like he did on presentation.  No abdominal pain.  Anxiously awaiting cytology from paracentesis.    Objective: Vitals:   08/17/21 1702 08/17/21 2030 08/17/21 2033 08/18/21 0425  BP: 107/69 138/73  134/72  Pulse: 65   70  Resp: '20 19  17  '$ Temp: 98.2 F (36.8 C) 98.8 F (37.1 C)  98.3 F (36.8 C)  TempSrc: Oral Oral  Oral  SpO2: 98%  97%   Weight:        Intake/Output Summary (Last 24 hours) at 08/18/2021 1133 Last data filed at 08/18/2021 0431 Gross per 24 hour  Intake 602.29 ml  Output 900 ml  Net -297.71 ml    Filed Weights   08/13/21 2200  Weight: 65.8 kg    Examination:  General exam: : NAD Respiratory system: Decreased breath sounds in the left base.  No wheezes, no crackles, no rhonchi.  Normal respiratory effort.  Speaking in full sentences.   Cardiovascular system: Regular rate and rhythm no murmurs rubs or gallops.  No JVD.  No lower extremity edema.  Gastrointestinal system: Abdomen soft, nontender, nondistended, positive bowel sounds.  No rebound.  No guarding. Central nervous system: Alert and oriented. No focal neurological deficits. Extremities: Symmetric 5 x 5 power. Skin: No rashes, lesions or ulcers Psychiatry: Judgement and insight appear normal. Mood & affect appropriate.    Data Reviewed: I have personally  reviewed following labs and imaging studies  CBC: Recent Labs  Lab 08/13/21 1227 08/14/21 0322 08/15/21 0740 08/16/21 0107 08/17/21 0116 08/18/21 0124  WBC 11.0* 9.4 12.2* 11.9* 11.2* 12.0*  NEUTROABS 8.8*  --   --  9.4* 8.9* 9.5*  HGB 12.9* 11.8* 11.4* 11.6* 12.8* 12.2*  HCT 39.8 36.6* 35.1* 34.9* 37.5* 37.1*  MCV 109.6* 109.9* 108.3* 107.7* 107.1* 106.6*  PLT 238 213 218 209 220 226     Basic Metabolic Panel: Recent Labs  Lab 08/14/21 0322 08/15/21 0740 08/16/21 0107 08/17/21 0116 08/18/21 0124  NA 136 138 138 137 137  K 4.3 4.0 4.0 4.0 3.8  CL 111 114* 115* 110 113*  CO2 19* 18* 17* 18* 19*  GLUCOSE 107* 124* 128* 139* 116*  BUN 26* 24* '22 19 18  '$ CREATININE 1.62* 1.62* 1.53* 1.43* 1.37*  CALCIUM 10.9* 10.6* 10.5* 10.2 10.3  MG  --   --   --   --  1.4*  PHOS  --  2.6 2.3* 2.3* 2.1*     GFR: Estimated Creatinine Clearance: 34.7 mL/min (A) (by C-G formula based on SCr of 1.37 mg/dL (H)).  Liver Function Tests: Recent Labs  Lab 08/14/21 0322 08/15/21 0740 08/16/21 0107 08/17/21 0116  08/18/21 0124  AST 31  --   --   --   --   ALT 27  --   --   --   --   ALKPHOS 85  --   --   --   --   BILITOT 0.9  --   --   --   --   PROT 5.5*  --   --   --   --   ALBUMIN 2.6* 2.3* 2.3* 2.3* 2.3*     CBG: Recent Labs  Lab 08/17/21 0743 08/17/21 1201 08/17/21 1605 08/17/21 2105 08/18/21 0801  GLUCAP 168* 182* 142* 210* 150*      Recent Results (from the past 240 hour(s))  Resp Panel by RT-PCR (Flu A&B, Covid) Nasopharyngeal Swab     Status: None   Collection Time: 08/13/21  4:36 PM   Specimen: Nasopharyngeal Swab; Nasopharyngeal(NP) swabs in vial transport medium  Result Value Ref Range Status   SARS Coronavirus 2 by RT PCR NEGATIVE NEGATIVE Final    Comment: (NOTE) SARS-CoV-2 target nucleic acids are NOT DETECTED.  The SARS-CoV-2 RNA is generally detectable in upper respiratory specimens during the acute phase of infection. The lowest concentration of  SARS-CoV-2 viral copies this assay can detect is 138 copies/mL. A negative result does not preclude SARS-Cov-2 infection and should not be used as the sole basis for treatment or other patient management decisions. A negative result may occur with  improper specimen collection/handling, submission of specimen other than nasopharyngeal swab, presence of viral mutation(s) within the areas targeted by this assay, and inadequate number of viral copies(<138 copies/mL). A negative result must be combined with clinical observations, patient history, and epidemiological information. The expected result is Negative.  Fact Sheet for Patients:  EntrepreneurPulse.com.au  Fact Sheet for Healthcare Providers:  IncredibleEmployment.be  This test is no t yet approved or cleared by the [place] FDA and  has been authorized for detection and/or diagnosis of SARS-CoV-2 by FDA under an Emergency Use Authorization (EUA). This EUA will remain  in effect (meaning this test can be used) for the duration of the COVID-19 declaration under Section 564(b)(1) of the Act, 21 U.S.C.section 360bbb-3(b)(1), unless the authorization is terminated  or revoked sooner.       Influenza A by PCR NEGATIVE NEGATIVE Final   Influenza B by PCR NEGATIVE NEGATIVE Final    Comment: (NOTE) The Xpert Xpress SARS-CoV-2/FLU/RSV plus assay is intended as an aid in the diagnosis of influenza from Nasopharyngeal swab specimens and should not be used as a sole basis for treatment. Nasal washings and aspirates are unacceptable for Xpert Xpress SARS-CoV-2/FLU/RSV testing.  Fact Sheet for Patients: EntrepreneurPulse.com.au  Fact Sheet for Healthcare Providers: IncredibleEmployment.be  This test is not yet approved or cleared by the [place] FDA and has been authorized for detection and/or diagnosis of SARS-CoV-2 by FDA under an Emergency Use  Authorization (EUA). This EUA will remain in effect (meaning this test can be used) for the duration of the COVID-19 declaration under Section 564(b)(1) of the Act, 21 U.S.C. section 360bbb-3(b)(1), unless the authorization is terminated or revoked.  Performed at [facility], [place] [address],  [place], [place] [address]   Body fluid culture w Gram Stain     Status: None   Collection Time: 08/14/21 11:59 AM   Specimen: Lung, Left; Pleural Fluid  Result Value Ref Range Status   Specimen Description FLUID  Final   Special Requests LUNG LEFT  Final   Gram  Stain   Final    FEW WBC PRESENT, PREDOMINANTLY MONONUCLEAR NO ORGANISMS SEEN    Culture   Final    NO GROWTH 3 Simpson Performed at [facility] [address], [place], [place] [address]    Report Status 08/18/2021 FINAL  Final  Urine Culture     Status: None   Collection Time: 08/14/21  4:32 PM   Specimen: Urine, Clean Catch  Result Value Ref Range Status   Specimen Description URINE, CLEAN CATCH  Final   Special Requests NONE  Final   Culture   Final    NO GROWTH Performed at [facility], [place] [address] [place], [place] [address]    Report Status 08/16/2021 FINAL  Final          Radiology Studies: No results found.      Scheduled Meds:  allopurinol  300 mg Oral QPM   artificial tears  1 application Both Eyes QHS   enoxaparin (LOVENOX) injection  30 mg Subcutaneous Q24H   ferrous sulfate  325 mg Oral QODAY   finasteride  5 mg Oral Daily   folic acid  3 mg Oral q morning   guaiFENesin  1,200 mg Oral BID   insulin aspart  0-9 Units Subcutaneous TID WC   levothyroxine  50 mcg Oral Q0600   loratadine  10 mg Oral q morning   metoprolol succinate  25 mg Oral Daily   mometasone-formoterol  2 puff Inhalation BID   pantoprazole  40 mg Oral Q0600   rosuvastatin  10 mg Oral Q M,W,F   sertraline  50 mg Oral q morning   sodium bicarbonate  650 mg Oral TID   sodium chloride  1 drop Both Eyes QID    sodium chloride flush  3 mL Intravenous Q12H   Continuous Infusions:  magnesium sulfate bolus IVPB 4 g (08/18/21 1051)     LOS: 4 Simpson    Time spent: 35 minutes    Irine Seal, MD [facility]   To contact the attending provider between 7A-7P or the covering provider during after hours 7P-7A, please log into the web site [email or web address] and access using universal [place] password for that web site. If you do not have the password, please call the hospital operator.  08/18/2021, 11:33 AM

## 2021-08-19 ENCOUNTER — Inpatient Hospital Stay (HOSPITAL_COMMUNITY): Payer: Medicare Other

## 2021-08-19 ENCOUNTER — Encounter (HOSPITAL_COMMUNITY)
Admission: EM | Disposition: A | Discharge status: Hospice | Payer: Self-pay | Source: Home / Self Care | Attending: Internal Medicine

## 2021-08-19 DIAGNOSIS — J9601 Acute respiratory failure with hypoxia: Secondary | ICD-10-CM | POA: Diagnosis not present

## 2021-08-19 HISTORY — PX: THORACENTESIS: SHX235

## 2021-08-19 LAB — RENAL FUNCTION PANEL
Albumin: 2.2 g/dL — ABNORMAL LOW (ref 3.5–5.0)
Anion gap: 5 (ref 5–15)
BUN: 19 mg/dL (ref 8–23)
CO2: 20 mmol/L — ABNORMAL LOW (ref 22–32)
Calcium: 10.4 mg/dL — ABNORMAL HIGH (ref 8.9–10.3)
Chloride: 111 mmol/L (ref 98–111)
Creatinine, Ser: 1.4 mg/dL — ABNORMAL HIGH (ref 0.61–1.24)
GFR, Estimated: 48 mL/min — ABNORMAL LOW (ref >60)
Glucose, Bld: 134 mg/dL — ABNORMAL HIGH (ref 70–99)
Phosphorus: 2.2 mg/dL — ABNORMAL LOW (ref 2.5–4.6)
Potassium: 4.2 mmol/L (ref 3.5–5.1)
Sodium: 136 mmol/L (ref 135–145)

## 2021-08-19 LAB — MAGNESIUM: Magnesium: 1.8 mg/dL (ref 1.7–2.4)

## 2021-08-19 LAB — CBC WITH DIFFERENTIAL/PLATELET
Abs Immature Granulocytes: 0.09 10*3/uL — ABNORMAL HIGH (ref 0.00–0.07)
Basophils Absolute: 0.1 10*3/uL (ref 0.0–0.1)
Basophils Relative: 0 %
Eosinophils Absolute: 0.6 10*3/uL — ABNORMAL HIGH (ref 0.0–0.5)
Eosinophils Relative: 5 %
HCT: 36.5 % — ABNORMAL LOW (ref 39.0–52.0)
Hemoglobin: 12.3 g/dL — ABNORMAL LOW (ref 13.0–17.0)
Immature Granulocytes: 1 %
Lymphocytes Relative: 6 %
Lymphs Abs: 0.7 10*3/uL (ref 0.7–4.0)
MCH: 35.7 pg — ABNORMAL HIGH (ref 26.0–34.0)
MCHC: 33.7 g/dL (ref 30.0–36.0)
MCV: 105.8 fL — ABNORMAL HIGH (ref 80.0–100.0)
Monocytes Absolute: 1.2 10*3/uL — ABNORMAL HIGH (ref 0.1–1.0)
Monocytes Relative: 10 %
Neutro Abs: 9.8 10*3/uL — ABNORMAL HIGH (ref 1.7–7.7)
Neutrophils Relative %: 78 %
Platelets: 216 10*3/uL (ref 150–400)
RBC: 3.45 MIL/uL — ABNORMAL LOW (ref 4.22–5.81)
RDW: 14.5 % (ref 11.5–15.5)
WBC: 12.6 10*3/uL — ABNORMAL HIGH (ref 4.0–10.5)
nRBC: 0 % (ref 0.0–0.2)

## 2021-08-19 LAB — BODY FLUID CELL COUNT WITH DIFFERENTIAL
Eos, Fluid: 3 %
Lymphs, Fluid: 92 %
Monocyte-Macrophage-Serous Fluid: 0 % — ABNORMAL LOW (ref 50–90)
Neutrophil Count, Fluid: 5 % (ref 0–25)
Total Nucleated Cell Count, Fluid: 1785 cu mm — ABNORMAL HIGH (ref 0–1000)

## 2021-08-19 LAB — GLUCOSE, PLEURAL OR PERITONEAL FLUID: Glucose, Fluid: 176 mg/dL

## 2021-08-19 LAB — GLUCOSE, CAPILLARY
Glucose-Capillary: 200 mg/dL — ABNORMAL HIGH (ref 70–99)
Glucose-Capillary: 99 mg/dL (ref 70–99)
Glucose-Capillary: 148 mg/dL — ABNORMAL HIGH (ref 70–99)

## 2021-08-19 LAB — LACTATE DEHYDROGENASE, PLEURAL OR PERITONEAL FLUID: LD, Fluid: 408 U/L — ABNORMAL HIGH (ref 3–23)

## 2021-08-19 LAB — PROTEIN, PLEURAL OR PERITONEAL FLUID: Total protein, fluid: <3 g/dL

## 2021-08-19 LAB — PTH, INTACT AND CALCIUM
Calcium, Total (PTH): 8.3 mg/dL — ABNORMAL LOW (ref 8.6–10.2)
PTH: 9 pg/mL — ABNORMAL LOW (ref 15–65)

## 2021-08-19 LAB — CYTOLOGY - NON PAP

## 2021-08-19 SURGERY — THORACENTESIS
Laterality: Left

## 2021-08-19 MED ORDER — SODIUM CHLORIDE 0.9 % IV SOLN: INTRAVENOUS | Status: DC

## 2021-08-19 MED ORDER — ARTIFICIAL TEARS OPHTHALMIC OINT
TOPICAL_OINTMENT | Freq: Every day | OPHTHALMIC | Status: DC
  Administered 2021-08-21: 1 via OPHTHALMIC
  Filled 2021-08-19: qty 3.5

## 2021-08-19 NOTE — Op Note (Signed)
Thoracentesis  Procedure Note  Seth Simpson  [id number]  07-17-1931  Date:08/19/21  Time:2:55 PM   Provider Performing:Seth Simpson B Seth Simpson   Procedure: Thoracentesis with imaging guidance [id number])  Indication(s) Pleural Effusion  Consent Risks of the procedure as well as the alternatives and risks of each were explained to the patient and/or caregiver.  Consent for the procedure was obtained and is signed in the bedside chart  Anesthesia Topical only with 1% lidocaine    Time Out Verified patient identification, verified procedure, site/side was marked, verified correct patient position, special equipment/implants available, medications/allergies/relevant history reviewed, required imaging and test results available.   Sterile Technique Maximal sterile technique including full sterile barrier drape, hand hygiene, sterile gown, sterile gloves, mask, hair covering, sterile ultrasound probe cover (if used).  Procedure Description Ultrasound was used to identify appropriate pleural anatomy for placement and overlying skin marked.  Area of drainage cleaned and draped in sterile fashion. Lidocaine was used to anesthetize the skin and subcutaneous tissue.  1400 cc's of amber, cloudy appearing fluid was drained from the left pleural space. Catheter then removed and bandaid applied to site.   Complications/Tolerance None; patient tolerated the procedure well. Chest X-ray is ordered to confirm no post-procedural complication.   EBL Minimal   Specimen(s) Pleural fluid

## 2021-08-19 NOTE — Progress Notes (Signed)
PT Cancellation Note  Patient Details Name: Seth Simpson MRN: [id number] DOB: [birth year]   Cancelled Treatment:    Reason Eval/Treat Not Completed: Other (comment).  Pt is off the floor, retry as time and pt allow.   Ramond Dial 08/19/2021, 1:51 PM  Mee Hives, PT MS Acute Rehab Dept. Number: [place] [facility] [place]

## 2021-08-19 NOTE — Progress Notes (Signed)
Seth Simpson  [id number] DOB: 1931/07/15 DOA: 08/13/2021 PCP: Josetta Huddle, MD    Brief Narrative:  87 year old with a history of CAD status post CABG, paroxysmal SVT on amiodarone, LBBB, DM2, HTN, HLD, hypothyroidism, depression, and BPH who presented to the ED with dyspnea.  CT chest in the ED revealed a possible left upper lobe lung mass and a moderate left-sided pleural effusion.  Patient was admitted to the acute units to undergo diagnostic and therapeutic thoracentesis.  Consultants:  PCCM  Code Status: NO CODE BLUE  Antimicrobials:  None  DVT prophylaxis: Lovenox  Subjective: Resting comfortably in bed eating lunch.  In good spirits, alert and oriented, conversant.  Denies pain.  Reports he is less short of breath.  Tells me he is ready to go home.  Assessment & Plan:  Acute hypoxic respiratory failure -left upper lobe/perihilar lung mass -left pleural effusion CT chest noted 4.0 cm medial left upper lobe/perihilar mass extending into the left hilum worrisome for bronchogenic neoplasm -thoracic nodal mets suspected -postobstructive opacity appreciated in the lingula and left lower lobe -underwent ultrasound-guided thoracentesis with cytology pending - Pulmonary following -awaiting tissue diagnosis to discuss options  Acute kidney injury Felt to be prerenal azotemia - baseline creatinine appears to be 1.0 - ACE inhibitor stopped - volume resuscitate -monitor trend  Recent Labs  Lab 08/15/21 0740 08/16/21 0107 08/17/21 0116 08/18/21 0124 08/19/21 0128  CREATININE 1.62* 1.53* 1.43* 1.37* 1.40*     Hypercalcemia Likely due to malignancy -asymptomatic for now -hydrate  Hypomagnesemia Corrected with supplementation  CAD status post CABG Continue Toprol and statin -asymptomatic at this time  Paroxysmal SVT/frequent PVCs Amiodarone on hold with acute pulmonary illness -continue Toprol  RA Methotrexate on hold with concern for postobstructive pneumonia  -monitor symptoms  DM2 A1c 7.2 -CBG reasonably controlled  Hypothyroidism Continue usual Synthroid dose  Depression Continue Zoloft  HTN Blood pressure controlled at this time  HLD Continue usual statin   Family Communication: Spoke with the patient's daughter at bedside Status is: Inpatient  Remains inpatient appropriate because:Inpatient level of care appropriate due to severity of illness  Dispo: The patient is from: Home              Anticipated d/c is to:  Unclear              Patient currently is not medically stable to d/c.   Difficult to place patient No   Objective: Blood pressure 117/69, pulse 63, temperature 98.6 F (37 C), temperature source Oral, resp. rate 17, weight 65.8 kg, SpO2 97 %.  Intake/Output Summary (Last 24 hours) at 08/19/2021 1130 Last data filed at 08/19/2021 0942 Gross per 24 hour  Intake 0.84 ml  Output 1150 ml  Net -1149.16 ml   Filed Weights   08/13/21 2200  Weight: 65.8 kg    Examination: General: No acute respiratory distress Lungs: Blunting of breath sounds in left base with very faint crackles, no wheezing, good breath sounds other fields Cardiovascular: Regular rate and rhythm without murmur gallop or rub normal S1 and S2 Abdomen: Nontender, nondistended, soft, bowel sounds positive, no rebound, no ascites, no appreciable mass Extremities: No significant cyanosis, clubbing, or edema bilateral lower extremities  CBC: Recent Labs  Lab 08/17/21 0116 08/18/21 0124 08/19/21 0128  WBC 11.2* 12.0* 12.6*  NEUTROABS 8.9* 9.5* 9.8*  HGB 12.8* 12.2* 12.3*  HCT 37.5* 37.1* 36.5*  MCV 107.1* 106.6* 105.8*  PLT 220 226 [id number]   Basic Metabolic Panel: Recent Labs  Lab 08/17/21 0116 08/18/21 0124 08/19/21 0128  NA 137 137 136  K 4.0 3.8 4.2  CL 110 113* 111  CO2 18* 19* 20*  GLUCOSE 139* 116* 134*  BUN '19 18 19  '$ CREATININE 1.43* 1.37* 1.40*  CALCIUM 10.2 10.3  8.3* 10.4*  MG  --  1.4* 1.8  PHOS 2.3* 2.1* 2.2*    GFR: Estimated Creatinine Clearance: 33.9 mL/min (A) (by C-G formula based on SCr of 1.4 mg/dL (H)).  Liver Function Tests: Recent Labs  Lab 08/14/21 0322 08/15/21 0740 08/16/21 0107 08/17/21 0116 08/18/21 0124 08/19/21 0128  AST 31  --   --   --   --   --   ALT 27  --   --   --   --   --   ALKPHOS 85  --   --   --   --   --   BILITOT 0.9  --   --   --   --   --   PROT 5.5*  --   --   --   --   --   ALBUMIN 2.6*   < > 2.3* 2.3* 2.3* 2.2*   < > = values in this interval not displayed.    HbA1C: Hgb A1c MFr Bld  Date/Time Value Ref Range Status  08/14/2021 03:22 AM 7.2 (H) 4.8 - 5.6 % Final    Comment:    (NOTE) Pre diabetes:          5.7%-6.4%  Diabetes:              >6.4%  Glycemic control for   <7.0% adults with diabetes   04/11/2008 03:43 AM   Final   5.7 (NOTE)   The ADA recommends the following therapeutic goals for glycemic   control related to Hgb A1C measurement:   Goal of Therapy:   < 7.0% Hgb A1C   Action Suggested:  > 8.0% Hgb A1C   Ref:  Diabetes Care, 22, Suppl. 1, 1999    CBG: Recent Labs  Lab 08/17/21 2105 08/18/21 0801 08/18/21 1153 08/18/21 1612 08/18/21 1936  GLUCAP 210* 150* 138* 166* 189*    Recent Results (from the past 240 hour(s))  Resp Panel by RT-PCR (Flu A&B, Covid) Nasopharyngeal Swab     Status: None   Collection Time: 08/13/21  4:36 PM   Specimen: Nasopharyngeal Swab; Nasopharyngeal(NP) swabs in vial transport medium  Result Value Ref Range Status   SARS Coronavirus 2 by RT PCR NEGATIVE NEGATIVE Final    Comment: (NOTE) SARS-CoV-2 target nucleic acids are NOT DETECTED.  The SARS-CoV-2 RNA is generally detectable in upper respiratory specimens during the acute phase of infection. The lowest concentration of SARS-CoV-2 viral copies this assay can detect is 138 copies/mL. A negative result does not preclude SARS-Cov-2 infection and should not be used as the sole basis for treatment or other patient management decisions. A  negative result may occur with  improper specimen collection/handling, submission of specimen other than nasopharyngeal swab, presence of viral mutation(s) within the areas targeted by this assay, and inadequate number of viral copies(<138 copies/mL). A negative result must be combined with clinical observations, patient history, and epidemiological information. The expected result is Negative.  Fact Sheet for Patients:  EntrepreneurPulse.com.au  Fact Sheet for Healthcare Providers:  IncredibleEmployment.be  This test is no t yet approved or cleared by the [place] FDA and  has been authorized for detection and/or diagnosis of SARS-CoV-2 by FDA under an Emergency  Use Authorization (EUA). This EUA will remain  in effect (meaning this test can be used) for the duration of the COVID-19 declaration under Section 564(b)(1) of the Act, 21 U.S.C.section 360bbb-3(b)(1), unless the authorization is terminated  or revoked sooner.       Influenza A by PCR NEGATIVE NEGATIVE Final   Influenza B by PCR NEGATIVE NEGATIVE Final    Comment: (NOTE) The Xpert Xpress SARS-CoV-2/FLU/RSV plus assay is intended as an aid in the diagnosis of influenza from Nasopharyngeal swab specimens and should not be used as a sole basis for treatment. Nasal washings and aspirates are unacceptable for Xpert Xpress SARS-CoV-2/FLU/RSV testing.  Fact Sheet for Patients: EntrepreneurPulse.com.au  Fact Sheet for Healthcare Providers: IncredibleEmployment.be  This test is not yet approved or cleared by the [place] FDA and has been authorized for detection and/or diagnosis of SARS-CoV-2 by FDA under an Emergency Use Authorization (EUA). This EUA will remain in effect (meaning this test can be used) for the duration of the COVID-19 declaration under Section 564(b)(1) of the Act, 21 U.S.C. section 360bbb-3(b)(1), unless the authorization  is terminated or revoked.  Performed at [facility], [place] [address], [place], [place] [address]   Body fluid culture w Gram Stain     Status: None   Collection Time: 08/14/21 11:59 AM   Specimen: Lung, Left; Pleural Fluid  Result Value Ref Range Status   Specimen Description FLUID  Final   Special Requests LUNG LEFT  Final   Gram Stain   Final    FEW WBC PRESENT, PREDOMINANTLY MONONUCLEAR NO ORGANISMS SEEN    Culture   Final    NO GROWTH 3 DAYS Performed at [facility], [place] [address] [place], [place] [address]    Report Status 08/18/2021 FINAL  Final  Urine Culture     Status: None   Collection Time: 08/14/21  4:32 PM   Specimen: Urine, Clean Catch  Result Value Ref Range Status   Specimen Description URINE, CLEAN CATCH  Final   Special Requests NONE  Final   Culture   Final    NO GROWTH Performed at [facility], [place] [address] [place],  [address]    Report Status 08/16/2021 FINAL  Final     Scheduled Meds:  allopurinol  300 mg Oral QPM   artificial tears  1 application Both Eyes QHS   enoxaparin (LOVENOX) injection  30 mg Subcutaneous Q24H   ferrous sulfate  325 mg Oral QODAY   finasteride  5 mg Oral Daily   folic acid  3 mg Oral q morning   guaiFENesin  1,200 mg Oral BID   insulin aspart  0-9 Units Subcutaneous TID WC   levothyroxine  50 mcg Oral Q0600   loratadine  10 mg Oral q morning   metoprolol succinate  25 mg Oral Daily   mometasone-formoterol  2 puff Inhalation BID   pantoprazole  40 mg Oral Q0600   rosuvastatin  10 mg Oral Q M,W,F   sertraline  50 mg Oral q morning   sodium chloride  1 drop Both Eyes QID   sodium chloride flush  3 mL Intravenous Q12H   Continuous Infusions:  sodium chloride 10 mL/hr at 08/19/21 0942     LOS: 5 days   Cherene Altes, MD [facility] Office  [phone] Pager - Text Page per Shea Evans  If 7PM-7AM, please contact night-coverage per Amion 08/19/2021, 11:30 AM

## 2021-08-19 NOTE — Progress Notes (Signed)
   NAME:  Seth Simpson, MRN:  [id number], DOB:  21-Jun-1931, LOS: 5 ADMISSION DATE:  08/13/2021, CONSULTATION DATE:  08/14/21 REFERRING MD:  Irine Seal, MD CHIEF COMPLAINT:  Lung Mass, Pleural Effusion   History of Present Illness:  Seth Simpson is an 87 year old male with CAD s/p CABG, DMII, hypertension, and hyperlipidemia who presented to the ER on 08/13/21 with shortness of breath.   The dyspnea was progressive over the past 2 weeks. Initial chest radiograph showed left pleural effusion and left hilar fullness. CT chest with contrast was then performed which showed 4cm left upper lobe/perihilar mass extending into the left hilum. Moderate to large left pleural effusion. He underwent thoracentesis by IR 9/2 with removal of 1.3L of amber fluid.   He is a former smoker, he quit at age 5 and has a 12 pack year smoking history. 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 He denies any harmful dust or chemical exposures. Denies any asbestos exposures.  He reports his dyspnea is improved after the thoracentesis. He denies any cough or wheezing. Denies any hemoptysis. He has a 10lbs weight loss over recent months and his appetite has decreased. His daughter was at the bedside.  Pertinent  Medical History  Coronary Artery Disease GERD Hypertension Hyperlipidemia Rheumatoid arthritis Diabetes Mellitus Type II  Significant Hospital Events: Including procedures, antibiotic start and stop dates in addition to other pertinent events   Admitted 9/1 for dyspnea, CT chest notable for left hilar mass and left pleural effusion 9/2 Thoracentesis with IR Cytology pending  Interim History / Subjective:  NAEON.  Objective   Blood pressure 129/76, pulse 66, temperature 97.8 F (36.6 C), temperature source Oral, resp. rate 16, weight 65.8 kg, SpO2 92 %.        Intake/Output Summary (Last 24 hours) at 08/19/2021 0831 Last  data filed at 08/19/2021 [id number] Gross per 24 hour  Intake --  Output 1150 ml  Net -1150 ml    Filed Weights   08/13/21 2200  Weight: 65.8 kg    Examination: General: Elderly male, thin, no acute distress, resting in bed HENT: Roff/AT, sclera anicteric, moist mucous membranes, No LAD or JVD noted Lungs: Clear to auscultation. Diminished left base, No wheezing or rhonchi. Cardiovascular: rrr, systolic murmur Abdomen: soft, non-tender, non-distended, bowel sounds present Extremities: warm, trace edema, no obvious deformities Neuro: alert, moving all extremities, hard of hearing  Resolved Hospital Problem list     Assessment & Plan:  Acute Hypoxemic Respiratory Failure Left Pleural Effusion Left Hilar Mass Emphysema  - Continue supplemental oxygen as needed to maintain SpO2 90-92% - Continue inhaler therapy - Pleural fluid cytology studies still pending, Dr. Erin Fulling to contact pathology today before proceeding with repeat thora - Tentatively planning for repeat thoracentesis today at 2pm in case pleural fluid is non-diagnostic in order to try to obtain diagnosis via a more conservative route via pleural fluid. If no diagnosis obtained via pleural fluid sampling then will discuss moving forward with bronchoscopy and EBUS.   PCCM will continue to follow.   Montey Hora, [facility] For pager details, please see AMION or use Epic chat  After 1900, please call [facility] for cross coverage needs 08/19/2021, 8:34 AM

## 2021-08-19 NOTE — TOC Progression Note (Addendum)
Transition of Care [facility]) - Progression Note    Patient Details  Name: Seth Simpson MRN: [id number] Date of Birth: December 06, 1931  [facility] of [facility] CM/SW Melbeta, RN Phone [phone]  08/19/2021, 3:24 PM  Clinical Narrative:    CM spoke with daughter Diane at the bedside. Diane is inquiring to know if [facility] has a bed available. Daughter states that she may be willing to place patient if he could get a bed at [facility]. Currently there are no beds available. Daughter has been made aware and she does not want to move forward with SNF placement process at this time. TOC will continue to follow.          Expected Discharge Plan and Services                                                 Social Determinants of Health (SDOH) Interventions    Readmission Risk Interventions No flowsheet data found.

## 2021-08-20 DIAGNOSIS — E7800 Pure hypercholesterolemia, unspecified: Secondary | ICD-10-CM

## 2021-08-20 DIAGNOSIS — J9601 Acute respiratory failure with hypoxia: Secondary | ICD-10-CM | POA: Diagnosis not present

## 2021-08-20 DIAGNOSIS — R0902 Hypoxemia: Secondary | ICD-10-CM

## 2021-08-20 LAB — GLUCOSE, CAPILLARY
Glucose-Capillary: 162 mg/dL — ABNORMAL HIGH (ref 70–99)
Glucose-Capillary: 142 mg/dL — ABNORMAL HIGH (ref 70–99)
Glucose-Capillary: 155 mg/dL — ABNORMAL HIGH (ref 70–99)
Glucose-Capillary: 249 mg/dL — ABNORMAL HIGH (ref 70–99)
Glucose-Capillary: 190 mg/dL — ABNORMAL HIGH (ref 70–99)

## 2021-08-20 LAB — CYTOLOGY - NON PAP

## 2021-08-20 NOTE — Progress Notes (Signed)
Physical Therapy Treatment Patient Details Name: Seth Simpson MRN: [id number] DOB: 04-22-1931 Today's Date: 08/20/2021    History of Present Illness 87 y/o presented to ED on 9/1 for DOE. CT chest concerning of L upper lobe mass and moderate L sided pleural effusion concerning for malignancy. Pt s/p thoracentesis 9/2 and 9/7. PMH: CAD s/p CABG, paroxysmal SVT on amiodarone, frequent PVCs, LBBB, type 2 diabetes, HTN, HLD, hypothyroidism, depression, BPH    PT Comments    Pt tolerated treatment well; however, was greatly fatigued after ambulating with RW and counting steps. Pt's sats decreased to 88% after ambulating 10', requiring approximately a 79mn seated rest prior to second trial and demonstrated a significant posterior lean with bed mobility compared to previous sessions. Continue to recommend SNF upon d/c given pt's deficits and performance in PT session today.    Follow Up Recommendations  SNF;Supervision/Assistance - 24 hour     Equipment Recommendations  None recommended by PT    Recommendations for Other Services       Precautions / Restrictions Precautions Precautions: Fall Precaution Comments: watch sats Restrictions Weight Bearing Restrictions: No    Mobility  Bed Mobility Overal bed mobility: Needs Assistance Bed Mobility: Supine to Sit     Supine to sit: Min assist     General bed mobility comments: assist to lift trunk from surface with multimodal cues and increased time    Transfers Overall transfer level: Needs assistance Equipment used: Rolling walker (2 wheeled) Transfers: Sit to/from Stand Sit to Stand: Min assist         General transfer comment: physical assist to rise with cues for hand placement, SpO2 drop to 88% in standing on 3L with bump to 4L 91%  Ambulation/Gait Ambulation/Gait assistance: Min assist;+2 safety/equipment (chair follow) Gait Distance (Feet): 12 Feet Assistive device: Rolling walker (2 wheeled) Gait  Pattern/deviations: Step-through pattern;Decreased stride length;Shuffle;Trunk flexed   Gait velocity interpretation: <1.8 ft/sec, indicate of risk for recurrent falls General Gait Details: pt with crouched gait and unable to extend legs and trunk with cues. Pt on 4L for gait with sats dropping to 88% with 10' of gait. Cues for RW use and safety. Chair follow with pt needing to sit due to fatigue. Pt required grossly 3 min seated rest prior to 2nd trial fo 12' with probe replaced pt 97% on 4L with return to 2L at 95%. Pt limited by fatigue   Stairs             Wheelchair Mobility    Modified Rankin (Stroke Patients Only)       Balance Overall balance assessment: Needs assistance Sitting-balance support: No upper extremity supported;Feet supported Sitting balance-Leahy Scale: Poor Sitting balance - Comments: min assist EOb due to posterior lean   Standing balance support: Bilateral upper extremity supported;During functional activity Standing balance-Leahy Scale: Poor Standing balance comment: reliant on UE support and external assist                            Cognition Arousal/Alertness: Awake/alert Behavior During Therapy: WFL for tasks assessed/performed Overall Cognitive Status: Impaired/Different from baseline Area of Impairment: Safety/judgement;Orientation                 Orientation Level: Disoriented to;Time       Safety/Judgement: Decreased awareness of deficits     General Comments: pt with posterior LOB EOB and unaware, pt not oriented to day or month  Exercises General Exercises - Lower Extremity Long Arc Quad: AROM;Both;10 reps;Seated (x 2 sets) Hip Flexion/Marching: AROM;Both;10 reps;Standing    General Comments        Pertinent Vitals/Pain Pain Assessment: No/denies pain    Home Living                      Prior Function            PT Goals (current goals can now be found in the care plan section)  Progress towards PT goals: Progressing toward goals    Frequency    Min 3X/week      PT Plan Current plan remains appropriate    Co-evaluation              AM-PAC PT "6 Clicks" Mobility   Outcome Measure  Help needed turning from your back to your side while in a flat bed without using bedrails?: A Little Help needed moving from lying on your back to sitting on the side of a flat bed without using bedrails?: A Little Help needed moving to and from a bed to a chair (including a wheelchair)?: A Little Help needed standing up from a chair using your arms (e.g., wheelchair or bedside chair)?: A Little Help needed to walk in hospital room?: A Lot Help needed climbing 3-5 steps with a railing? : Total 6 Click Score: 15    End of Session Equipment Utilized During Treatment: Gait belt;Oxygen Activity Tolerance: Patient limited by fatigue Patient left: in chair;with chair alarm set;with call bell/phone within reach Nurse Communication: Mobility status PT Visit Diagnosis: Unsteadiness on feet (R26.81);Muscle weakness (generalized) (M62.81);History of falling (Z91.81);Difficulty in walking, not elsewhere classified (R26.2)     Time: [id number] PT Time Calculation (min) (ACUTE ONLY): 27 min  Charges:  $Gait Training: 8-22 mins $Therapeutic Exercise: 8-22 mins                     Louie Casa, SPT Acute Rehab: [phone]     Domingo Dimes 08/20/2021, 10:49 AM

## 2021-08-20 NOTE — Progress Notes (Signed)
PROGRESS NOTE    Seth Simpson  [id number] DOB: [birth year] DOA: 08/13/2021 PCP: Josetta Huddle, MD    Brief Narrative:  87 year old with a history of CAD status post CABG, paroxysmal SVT on amiodarone, LBBB, DM2, HTN, HLD, hypothyroidism, depression, and BPH who presented to the ED with dyspnea.  CT chest in the ED revealed a possible left upper lobe lung mass and a moderate left-sided pleural effusion.  Patient was admitted to the acute units to undergo diagnostic and therapeutic thoracentesis.  Assessment & Plan:   Principal Problem:   Acute respiratory failure with hypoxia (HCC) Active Problems:   Coronary artery disease   Hypertension   Hypercholesteremia   PSVT (paroxysmal supraventricular tachycardia) (HCC)   Type II diabetes mellitus (HCC)   Mass of upper lobe of left lung   Pleural effusion    Acute hypoxic respiratory failure -left upper lobe/perihilar lung mass -left pleural effusion CT chest noted 4.0 cm medial left upper lobe/perihilar mass extending into the left hilum worrisome for bronchogenic neoplasm -thoracic nodal mets suspected -postobstructive opacity appreciated in the lingula and left lower lobe -underwent ultrasound-guided thoracentesis with cytology pending - Pulmonary following -cytology pending Discussed with pulmonary.  Patient is planned for EBUS with Pleurx cath placement tomorrow   Acute kidney injury Felt to be prerenal azotemia - baseline creatinine appears to be 1.0 - ACE inhibitor stopped -volume resuscitated Repeat basic metabolic panel in the morning  Hypercalcemia Likely due to malignancy -asymptomatic for now  patient received hydration.  Calcium levels have remained stable Continue to follow electrolyte trends   Hyomagnesemia Corrected with supplementation   CAD status post CABG Continue Toprol and statin  Chest pain-free this morning   Paroxysmal SVT/frequent PVCs Amiodarone on hold with acute pulmonary illness -continue  Toprol   RA Methotrexate remains on hold with concern for postobstructive pneumonia -monitor symptoms   DM2 A1c 7.2 -CBG reasonably controlled   Hypothyroidism Continue usual Synthroid dose   Depression Continue Zoloft   HTN Blood pressure controlled at this time   HLD Continue usual statin    DVT prophylaxis: Lovenox subq Code Status: DNR Family Communication: Pt in room, family not at bedside  Status is: Inpatient  Remains inpatient appropriate because:Inpatient level of care appropriate due to severity of illness  Dispo: The patient is from: Home              Anticipated d/c is to: SNF              Patient currently is not medically stable to d/c.   Difficult to place patient No   Consultants:  Pulmonary  Procedures:  Thoracentesis  Antimicrobials: Anti-infectives (From admission, onward)    None       Subjective: Reports feeling better today, states breathing is better  Objective: Vitals:   08/20/21 0825 08/20/21 0855 08/20/21 0917 08/20/21 1202  BP: 114/72   95/62  Pulse: 71 76  65  Resp: 20   19  Temp: 98.6 F (37 C)   98.6 F (37 C)  TempSrc: Oral   Oral  SpO2:  91% 92% 91%  Weight:        Intake/Output Summary (Last 24 hours) at 08/20/2021 1443 Last data filed at 08/20/2021 0841 Gross per 24 hour  Intake 240.31 ml  Output 1150 ml  Net -909.69 ml   Filed Weights   08/13/21 2200  Weight: 65.8 kg    Examination: General exam: Awake, laying in bed, in nad Respiratory system: Slightly increased  respiratory effort, no wheezing Cardiovascular system: regular rate, s1, s2 Gastrointestinal system: Soft, nondistended, positive BS Central nervous system: CN2-12 grossly intact, strength intact Extremities: Perfused, no clubbing Skin: Normal skin turgor, no notable skin lesions seen Psychiatry: Mood normal // no visual hallucinations   Data Reviewed: I have personally reviewed following labs and imaging studies  CBC: Recent Labs  Lab  08/15/21 0740 08/16/21 0107 08/17/21 0116 08/18/21 0124 08/19/21 0128  WBC 12.2* 11.9* 11.2* 12.0* 12.6*  NEUTROABS  --  9.4* 8.9* 9.5* 9.8*  HGB 11.4* 11.6* 12.8* 12.2* 12.3*  HCT 35.1* 34.9* 37.5* 37.1* 36.5*  MCV 108.3* 107.7* 107.1* 106.6* 105.8*  PLT 218 209 220 226 [id number]   Basic Metabolic Panel: Recent Labs  Lab 08/15/21 0740 08/16/21 0107 08/17/21 0116 08/18/21 0124 08/19/21 0128  NA 138 138 137 137 136  K 4.0 4.0 4.0 3.8 4.2  CL 114* 115* 110 113* 111  CO2 18* 17* 18* 19* 20*  GLUCOSE 124* 128* 139* 116* 134*  BUN 24* '22 19 18 19  '$ CREATININE 1.62* 1.53* 1.43* 1.37* 1.40*  CALCIUM 10.6* 10.5* 10.2 10.3  8.3* 10.4*  MG  --   --   --  1.4* 1.8  PHOS 2.6 2.3* 2.3* 2.1* 2.2*   GFR: Estimated Creatinine Clearance: 33.9 mL/min (A) (by C-G formula based on SCr of 1.4 mg/dL (H)). Liver Function Tests: Recent Labs  Lab 08/14/21 0322 08/15/21 0740 08/16/21 0107 08/17/21 0116 08/18/21 0124 08/19/21 0128  AST 31  --   --   --   --   --   ALT 27  --   --   --   --   --   ALKPHOS 85  --   --   --   --   --   BILITOT 0.9  --   --   --   --   --   PROT 5.5*  --   --   --   --   --   ALBUMIN 2.6* 2.3* 2.3* 2.3* 2.3* 2.2*   No results for input(s): LIPASE, AMYLASE in the last 168 hours. No results for input(s): AMMONIA in the last 168 hours. Coagulation Profile: No results for input(s): INR, PROTIME in the last 168 hours. Cardiac Enzymes: No results for input(s): CKTOTAL, CKMB, CKMBINDEX, TROPONINI in the last 168 hours. BNP (last 3 results) No results for input(s): PROBNP in the last 8760 hours. HbA1C: No results for input(s): HGBA1C in the last 72 hours. CBG: Recent Labs  Lab 08/19/21 1630 08/19/21 1950 08/20/21 0744 08/20/21 1159 08/20/21 1317  GLUCAP 99 148* 162* 142* 155*   Lipid Profile: No results for input(s): CHOL, HDL, LDLCALC, TRIG, CHOLHDL, LDLDIRECT in the last 72 hours. Thyroid Function Tests: No results for input(s): TSH, T4TOTAL, FREET4,  T3FREE, THYROIDAB in the last 72 hours. Anemia Panel: No results for input(s): VITAMINB12, FOLATE, FERRITIN, TIBC, IRON, RETICCTPCT in the last 72 hours. Sepsis Labs: No results for input(s): PROCALCITON, LATICACIDVEN in the last 168 hours.  Recent Results (from the past 240 hour(s))  Resp Panel by RT-PCR (Flu A&B, Covid) Nasopharyngeal Swab     Status: None   Collection Time: 08/13/21  4:36 PM   Specimen: Nasopharyngeal Swab; Nasopharyngeal(NP) swabs in vial transport medium  Result Value Ref Range Status   SARS Coronavirus 2 by RT PCR NEGATIVE NEGATIVE Final    Comment: (NOTE) SARS-CoV-2 target nucleic acids are NOT DETECTED.  The SARS-CoV-2 RNA is generally detectable in upper respiratory specimens during  the acute phase of infection. The lowest concentration of SARS-CoV-2 viral copies this assay can detect is 138 copies/mL. A negative result does not preclude SARS-Cov-2 infection and should not be used as the sole basis for treatment or other patient management decisions. A negative result may occur with  improper specimen collection/handling, submission of specimen other than nasopharyngeal swab, presence of viral mutation(s) within the areas targeted by this assay, and inadequate number of viral copies(<138 copies/mL). A negative result must be combined with clinical observations, patient history, and epidemiological information. The expected result is Negative.  Fact Sheet for Patients:  EntrepreneurPulse.com.au  Fact Sheet for Healthcare Providers:  IncredibleEmployment.be  This test is no t yet approved or cleared by the [place] FDA and  has been authorized for detection and/or diagnosis of SARS-CoV-2 by FDA under an Emergency Use Authorization (EUA). This EUA will remain  in effect (meaning this test can be used) for the duration of the COVID-19 declaration under Section 564(b)(1) of the Act, 21 U.S.C.section 360bbb-3(b)(1),  unless the authorization is terminated  or revoked sooner.       Influenza A by PCR NEGATIVE NEGATIVE Final   Influenza B by PCR NEGATIVE NEGATIVE Final    Comment: (NOTE) The Xpert Xpress SARS-CoV-2/FLU/RSV plus assay is intended as an aid in the diagnosis of influenza from Nasopharyngeal swab specimens and should not be used as a sole basis for treatment. Nasal washings and aspirates are unacceptable for Xpert Xpress SARS-CoV-2/FLU/RSV testing.  Fact Sheet for Patients: EntrepreneurPulse.com.au  Fact Sheet for Healthcare Providers: IncredibleEmployment.be  This test is not yet approved or cleared by the [place] FDA and has been authorized for detection and/or diagnosis of SARS-CoV-2 by FDA under an Emergency Use Authorization (EUA). This EUA will remain in effect (meaning this test can be used) for the duration of the COVID-19 declaration under Section 564(b)(1) of the Act, 21 U.S.C. section 360bbb-3(b)(1), unless the authorization is terminated or revoked.  Performed at [facility], [place] [address], [place], [place] [address]   Body fluid culture w Gram Stain     Status: None   Collection Time: 08/14/21 11:59 AM   Specimen: Lung, Left; Pleural Fluid  Result Value Ref Range Status   Specimen Description FLUID  Final   Special Requests LUNG LEFT  Final   Gram Stain   Final    FEW WBC PRESENT, PREDOMINANTLY MONONUCLEAR NO ORGANISMS SEEN    Culture   Final    NO GROWTH 3 DAYS Performed at [facility], [place] [address] [place], [place] [address]    Report Status 08/18/2021 FINAL  Final  Urine Culture     Status: None   Collection Time: 08/14/21  4:32 PM   Specimen: Urine, Clean Catch  Result Value Ref Range Status   Specimen Description URINE, CLEAN CATCH  Final   Special Requests NONE  Final   Culture   Final    NO GROWTH Performed at [facility], [place] [address] [place], [place] [address]    Report  Status 08/16/2021 FINAL  Final     Radiology Studies: DG Chest 2 View  Result Date: 08/19/2021 CLINICAL DATA:  Pleural effusion EXAM: CHEST - 2 VIEW COMPARISON:  Chest radiograph 08/14/2021 FINDINGS: Median sternotomy wires and mediastinal surgical clips are again seen. The cardiomediastinal silhouette is grossly similar, though suboptimally evaluated on the current study. The left pleural effusion has increased in size with worsening aeration of the left lung. Left  perihilar opacities are not significantly changed. The right lung is spared, with no new focal airspace opacity. There is no right pleural effusion. There is no pneumothorax. Marked degenerative change of both shoulders is again seen. There is no acute osseous abnormality. IMPRESSION: Increased left pleural effusion with worsened opacity in the left lung. Electronically Signed   By: Valetta Mole M.D.   On: 08/19/2021 10:31   DG CHEST PORT 1 VIEW  Result Date: 08/19/2021 CLINICAL DATA:  Status post thoracentesis EXAM: PORTABLE CHEST 1 VIEW COMPARISON:  08/19/2021 at 7:08 a.m. FINDINGS: 3:35 p.m. High riding humeral heads, consistent with chronic rotator cuff insufficiency. Midline trachea. Mild cardiomegaly. Prior median sternotomy. Atherosclerosis in the transverse aorta. Small left pleural effusion, decreased. No pneumothorax. Interstitial edema, moderate. Improved left base and worsened right base airspace disease. IMPRESSION: No pneumothorax or other acute complication after thoracentesis. Decreased left pleural effusion with improved adjacent airspace disease, atelectasis versus infection. Underlying congestive heart failure with developing right base airspace disease, favoring atelectasis or alveolar edema. Aortic Atherosclerosis (ICD10-I70.0). Electronically Signed   By: Abigail Miyamoto M.D.   On: 08/19/2021 16:01    Scheduled Meds:  allopurinol  300 mg Oral QPM   artificial tears   Both Eyes QHS   enoxaparin (LOVENOX) injection  30 mg  Subcutaneous Q24H   ferrous sulfate  325 mg Oral QODAY   finasteride  5 mg Oral Daily   folic acid  3 mg Oral q morning   guaiFENesin  1,200 mg Oral BID   insulin aspart  0-9 Units Subcutaneous TID WC   levothyroxine  50 mcg Oral Q0600   loratadine  10 mg Oral q morning   metoprolol succinate  25 mg Oral Daily   mometasone-formoterol  2 puff Inhalation BID   pantoprazole  40 mg Oral Q0600   rosuvastatin  10 mg Oral Q M,W,F   sertraline  50 mg Oral q morning   sodium chloride  1 drop Both Eyes QID   sodium chloride flush  3 mL Intravenous Q12H   Continuous Infusions:  sodium chloride Stopped (08/19/21 0943)     LOS: 6 days   Marylu Lund, MD [facility] Pager On Amion  If 7PM-7AM, please contact night-coverage 08/20/2021, 2:43 PM

## 2021-08-20 NOTE — Anesthesia Preprocedure Evaluation (Addendum)
Anesthesia Evaluation  Patient identified by MRN, date of birth, ID band Patient awake    Reviewed: Allergy & Precautions, NPO status , Patient's Chart, lab work & pertinent test results  Airway Mallampati: II  TM Distance: >3 FB Neck ROM: Full    Dental no notable dental hx. (+) Implants, Teeth Intact, Dental Advisory Given   Pulmonary former smoker,    Pulmonary exam normal breath sounds clear to auscultation       Cardiovascular hypertension, + CAD, + Past MI and + CABG  Normal cardiovascular exam+ dysrhythmias  Rhythm:Regular Rate:Normal  04/23/2021 echo 1. Left ventricular ejection fraction, by estimation, is 55 to 60%. Left  ventricular ejection fraction by 3D volume is 58 %. The left ventricle has  normal function. The left ventricle has no regional wall motion  abnormalities. Left ventricular diastolic  parameters are consistent with Grade I diastolic dysfunction (impaired  relaxation).  2. Right ventricular systolic function is mildly reduced. The right  ventricular size is mildly enlarged. There is normal pulmonary artery  systolic pressure. The estimated right ventricular systolic pressure is  [id number] mmHg.  3. Right atrial size was mildly dilated.  4. The mitral valve is normal in structure. Trivial mitral valve  regurgitation. No evidence of mitral stenosis.  5. Tricuspid valve regurgitation is mild to moderate.  6. The aortic valve is calcified. There is moderate calcification of the  aortic valve. Aortic valve regurgitation is trivial. Mild to moderate  aortic valve stenosis. Vmax 2.4 m/s, MG 98mHg, AVA 1.7 cm^2, DI 0.35.  7. The inferior vena cava is normal in size with greater than 50%  respiratory variability, suggesting right atrial pressure of 3 mmHg.    Neuro/Psych  Headaches,    GI/Hepatic hiatal hernia, GERD  ,(+) Cirrhosis       ,   Endo/Other  diabetes, Type 2  Renal/GU negative Renal  ROSLab Results      Component                Value               Date                      CREATININE               1.40 (H)            08/19/2021                BUN                      19                  08/19/2021                NA                       136                 08/19/2021                K                        4.2                 08/19/2021                CL  111                 08/19/2021                CO2                      20 (L)              08/19/2021                Musculoskeletal  (+) Arthritis ,   Abdominal   Peds  Hematology Lab Results      Component                Value               Date                      WBC                      12.6 (H)            08/19/2021                HGB                      12.3 (L)            08/19/2021                HCT                      36.5 (L)            08/19/2021                MCV                      105.8 (H)           08/19/2021                PLT                      216                 08/19/2021              Anesthesia Other Findings All: HCTZ, codeine  Reproductive/Obstetrics                            Anesthesia Physical Anesthesia Plan  ASA: 3  Anesthesia Plan: General   Post-op Pain Management:    Induction: Intravenous  PONV Risk Score and Plan: 3 and Treatment may vary due to age or medical condition and Ondansetron  Airway Management Planned: Oral ETT  Additional Equipment: None  Intra-op Plan:   Post-operative Plan: Extubation in OR  Informed Consent: I have reviewed the patients History and Physical, chart, labs and discussed the procedure including the risks, benefits and alternatives for the proposed anesthesia with the patient or authorized representative who has indicated his/her understanding and acceptance.   Patient has DNR.  Suspend DNR and Discussed DNR with power of attorney.   Dental advisory given  Plan Discussed  with: CRNA  Anesthesia Plan Comments: (Spoke with patients Daughter Wilma Flavin about DNR . She was explicit about if something catastrophic happened during the procedure  he would not want to be resuscitated. I reassured family member and discussed with her my desire to be able to reverse any iatrogenic events in the perioperative period. She agreed and will allow [place] to proceed.)      Anesthesia Quick Evaluation

## 2021-08-21 ENCOUNTER — Inpatient Hospital Stay (HOSPITAL_COMMUNITY): Payer: Medicare Other | Admitting: Anesthesiology

## 2021-08-21 ENCOUNTER — Encounter (HOSPITAL_COMMUNITY): Payer: Self-pay | Admitting: Pulmonary Disease

## 2021-08-21 ENCOUNTER — Encounter (HOSPITAL_COMMUNITY)
Admission: EM | Disposition: A | Discharge status: Hospice | Payer: Self-pay | Source: Home / Self Care | Attending: Internal Medicine

## 2021-08-21 DIAGNOSIS — J9601 Acute respiratory failure with hypoxia: Secondary | ICD-10-CM | POA: Diagnosis not present

## 2021-08-21 DIAGNOSIS — E7800 Pure hypercholesterolemia, unspecified: Secondary | ICD-10-CM | POA: Diagnosis not present

## 2021-08-21 HISTORY — PX: BRONCHIAL NEEDLE ASPIRATION BIOPSY: SHX5106

## 2021-08-21 HISTORY — PX: VIDEO BRONCHOSCOPY WITH ENDOBRONCHIAL ULTRASOUND: SHX6177

## 2021-08-21 LAB — COMPREHENSIVE METABOLIC PANEL
ALT: 28 U/L (ref 0–44)
AST: 34 U/L (ref 15–41)
Albumin: 2.1 g/dL — ABNORMAL LOW (ref 3.5–5.0)
Alkaline Phosphatase: 78 U/L (ref 38–126)
Anion gap: 5 (ref 5–15)
BUN: 20 mg/dL (ref 8–23)
CO2: 20 mmol/L — ABNORMAL LOW (ref 22–32)
Calcium: 9 mg/dL (ref 8.9–10.3)
Chloride: 111 mmol/L (ref 98–111)
Creatinine, Ser: 1.44 mg/dL — ABNORMAL HIGH (ref 0.61–1.24)
GFR, Estimated: 47 mL/min — ABNORMAL LOW (ref >60)
Glucose, Bld: 126 mg/dL — ABNORMAL HIGH (ref 70–99)
Potassium: 3.9 mmol/L (ref 3.5–5.1)
Sodium: 136 mmol/L (ref 135–145)
Total Bilirubin: 0.6 mg/dL (ref 0.3–1.2)
Total Protein: 4.9 g/dL — ABNORMAL LOW (ref 6.5–8.1)

## 2021-08-21 LAB — GLUCOSE, CAPILLARY
Glucose-Capillary: 130 mg/dL — ABNORMAL HIGH (ref 70–99)
Glucose-Capillary: 90 mg/dL (ref 70–99)
Glucose-Capillary: 115 mg/dL — ABNORMAL HIGH (ref 70–99)
Glucose-Capillary: 145 mg/dL — ABNORMAL HIGH (ref 70–99)

## 2021-08-21 LAB — CBC
HCT: 36.8 % — ABNORMAL LOW (ref 39.0–52.0)
Hemoglobin: 12 g/dL — ABNORMAL LOW (ref 13.0–17.0)
MCH: 34.8 pg — ABNORMAL HIGH (ref 26.0–34.0)
MCHC: 32.6 g/dL (ref 30.0–36.0)
MCV: 106.7 fL — ABNORMAL HIGH (ref 80.0–100.0)
Platelets: 229 10*3/uL (ref 150–400)
RBC: 3.45 MIL/uL — ABNORMAL LOW (ref 4.22–5.81)
RDW: 14.6 % (ref 11.5–15.5)
WBC: 12.1 10*3/uL — ABNORMAL HIGH (ref 4.0–10.5)
nRBC: 0 % (ref 0.0–0.2)

## 2021-08-21 SURGERY — BRONCHOSCOPY, WITH EBUS
Anesthesia: General

## 2021-08-21 MED ORDER — LIDOCAINE 2% (20 MG/ML) 5 ML SYRINGE
INTRAMUSCULAR | Status: DC | PRN
  Administered 2021-08-21: 60 mg via INTRAVENOUS

## 2021-08-21 MED ORDER — PROPOFOL 10 MG/ML IV BOLUS
INTRAVENOUS | Status: DC | PRN
  Administered 2021-08-21: 100 mg via INTRAVENOUS

## 2021-08-21 MED ORDER — ROCURONIUM BROMIDE 10 MG/ML (PF) SYRINGE
PREFILLED_SYRINGE | INTRAVENOUS | Status: DC | PRN
  Administered 2021-08-21: 50 mg via INTRAVENOUS

## 2021-08-21 MED ORDER — DEXAMETHASONE SODIUM PHOSPHATE 10 MG/ML IJ SOLN
INTRAMUSCULAR | Status: DC | PRN
  Administered 2021-08-21: 10 mg via INTRAVENOUS

## 2021-08-21 MED ORDER — PHENYLEPHRINE 40 MCG/ML (10ML) SYRINGE FOR IV PUSH (FOR BLOOD PRESSURE SUPPORT)
PREFILLED_SYRINGE | INTRAVENOUS | Status: DC | PRN
  Administered 2021-08-21 (×4): 80 ug via INTRAVENOUS

## 2021-08-21 MED ORDER — FENTANYL CITRATE (PF) 100 MCG/2ML IJ SOLN
INTRAMUSCULAR | Status: AC
  Filled 2021-08-21: qty 2

## 2021-08-21 MED ORDER — AMISULPRIDE (ANTIEMETIC) 5 MG/2ML IV SOLN: 10.0000 mg | Freq: Once | INTRAVENOUS | Status: DC | PRN

## 2021-08-21 MED ORDER — SUGAMMADEX SODIUM 200 MG/2ML IV SOLN
INTRAVENOUS | Status: DC | PRN
Start: 2021-08-21 — End: 2021-08-21
  Administered 2021-08-21: 150 mg via INTRAVENOUS

## 2021-08-21 MED ORDER — PHENYLEPHRINE HCL-NACL 20-0.9 MG/250ML-% IV SOLN
INTRAVENOUS | Status: DC | PRN
  Administered 2021-08-21: 25 ug/min via INTRAVENOUS

## 2021-08-21 MED ORDER — ONDANSETRON HCL 4 MG/2ML IJ SOLN
INTRAMUSCULAR | Status: DC | PRN
Start: 2021-08-21 — End: 2021-08-21
  Administered 2021-08-21: 4 mg via INTRAVENOUS

## 2021-08-21 MED ORDER — LACTATED RINGERS IV SOLN: INTRAVENOUS | Status: DC | PRN

## 2021-08-21 MED ORDER — FENTANYL CITRATE (PF) 100 MCG/2ML IJ SOLN: 25.0000 ug | INTRAMUSCULAR | Status: DC | PRN

## 2021-08-21 MED ORDER — ONDANSETRON HCL 4 MG/2ML IJ SOLN: 4.0000 mg | Freq: Once | INTRAMUSCULAR | Status: DC | PRN

## 2021-08-21 NOTE — Progress Notes (Signed)
NAME:  Seth Simpson, MRN:  [id number], DOB:  [birth year], LOS: 7 ADMISSION DATE:  08/13/2021, CONSULTATION DATE:  08/14/21 REFERRING MD:  Irine Seal, MD CHIEF COMPLAINT:  Lung Mass, Pleural Effusion   History of Present Illness:  Seth Simpson is an 87 year old male with CAD s/p CABG, DMII, hypertension, and hyperlipidemia who presented to the ER on 08/13/21 with shortness of breath.   The dyspnea was progressive over the past 2 weeks. Initial chest radiograph showed left pleural effusion and left hilar fullness. CT chest with contrast was then performed which showed 4cm left upper lobe/perihilar mass extending into the left hilum. Moderate to large left pleural effusion. He underwent thoracentesis by IR 9/2 with removal of 1.3L of amber fluid.   He is a former smoker, he quit at age 42 and has a 12 pack year smoking history. 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 He denies any harmful dust or chemical exposures. Denies any asbestos exposures.  He reports his dyspnea is improved after the thoracentesis. He denies any cough or wheezing. Denies any hemoptysis. He has a 10lbs weight loss over recent months and his appetite has decreased. His daughter was at the bedside.  Pertinent  Medical History  Coronary Artery Disease GERD Hypertension Hyperlipidemia Rheumatoid arthritis Diabetes Mellitus Type II  Significant Hospital Events: Including procedures, antibiotic start and stop dates in addition to other pertinent events   Admitted 9/1 for dyspnea, CT chest notable for left hilar mass and left pleural effusion 9/2 Thoracentesis with IR Cytology pending  Interim History / Subjective:   No acute issues. Patient is sleeping. Daughter is at bedside.  Objective   Blood pressure 122/85, pulse 66, temperature 98.5 F (36.9 C), temperature source Oral, resp. rate 19, weight 65.8 kg, SpO2 95 %.         Intake/Output Summary (Last 24 hours) at 08/21/2021 1242 Last data filed at 08/20/2021 2035 Gross per 24 hour  Intake --  Output 400 ml  Net -400 ml   Filed Weights   08/13/21 2200  Weight: 65.8 kg    Examination: General: Elderly male, thin, sleeping HENT: [place]/AT Lungs: Clear to auscultation. Diminished left base, No wheezing or rhonchi. Cardiovascular: rrr, systolic murmur Abdomen: soft, non-tender, non-distended, bowel sounds present Extremities: warm, no edema Neuro: sleeping GU: deferred  Resolved Hospital Problem list     Assessment & Plan:  Acute Hypoxemic Respiratory Failure Left Pleural Effusion Left Hilar Mass Emphysema  - Plan for EBUS and pleurX placement today in endo suite - After procedures patient will be ready for discharge from pulmonary standpoint. - Continue supplemental oxygen as needed to maintain SpO2 90-92% - Continue inhaler therapy - Pleural fluid cytology studies are negative for malignant cells  PCCM will sign off after procedures today. Will schedule outpatient follow up.  Labs   CBC: Recent Labs  Lab 08/16/21 0107 08/17/21 0116 08/18/21 0124 08/19/21 0128 08/21/21 0045  WBC 11.9* 11.2* 12.0* 12.6* 12.1*  NEUTROABS 9.4* 8.9* 9.5* 9.8*  --   HGB 11.6* 12.8* 12.2* 12.3* 12.0*  HCT 34.9* 37.5* 37.1* 36.5* 36.8*  MCV 107.7* 107.1* 106.6* 105.8* 106.7*  PLT 209 220 226 216 [id number]    Basic Metabolic Panel: Recent Labs  Lab 08/15/21 0740 08/16/21 0107 08/17/21 0116 08/18/21 0124 08/19/21 0128 08/21/21 0045  NA 138 138 137 137 136 136  K 4.0 4.0 4.0 3.8  4.2 3.9  CL 114* 115* 110 113* 111 111  CO2 18* 17* 18* 19* 20* 20*  GLUCOSE 124* 128* 139* 116* 134* 126*  BUN 24* '22 19 18 19 20  '$ CREATININE 1.62* 1.53* 1.43* 1.37* 1.40* 1.44*  CALCIUM 10.6* 10.5* 10.2 10.3  8.3* 10.4* 9.0  MG  --   --   --  1.4* 1.8  --   PHOS 2.6 2.3* 2.3* 2.1* 2.2*  --    GFR: Estimated Creatinine Clearance: 33 mL/min (A) (by C-G formula based on SCr  of 1.44 mg/dL (H)). Recent Labs  Lab 08/17/21 0116 08/18/21 0124 08/19/21 0128 08/21/21 0045  WBC 11.2* 12.0* 12.6* 12.1*    Liver Function Tests: Recent Labs  Lab 08/16/21 0107 08/17/21 0116 08/18/21 0124 08/19/21 0128 08/21/21 0045  AST  --   --   --   --  34  ALT  --   --   --   --  28  ALKPHOS  --   --   --   --  78  BILITOT  --   --   --   --  0.6  PROT  --   --   --   --  4.9*  ALBUMIN 2.3* 2.3* 2.3* 2.2* 2.1*   No results for input(s): LIPASE, AMYLASE in the last 168 hours. No results for input(s): AMMONIA in the last 168 hours.  ABG    Component Value Date/Time   PHART 7.380 04/11/2008 2044   PCO2ART 37.0 04/11/2008 2044   PO2ART 122.0 (H) 04/11/2008 2044   HCO3 21.8 04/11/2008 2044   TCO2 22 07/26/2017 0715   ACIDBASEDEF 3.0 (H) 04/11/2008 2044   O2SAT 99.0 04/11/2008 2044     Coagulation Profile: No results for input(s): INR, PROTIME in the last 168 hours.  Cardiac Enzymes: No results for input(s): CKTOTAL, CKMB, CKMBINDEX, TROPONINI in the last 168 hours.  HbA1C: Hgb A1c MFr Bld  Date/Time Value Ref Range Status  08/14/2021 03:22 AM 7.2 (H) 4.8 - 5.6 % Final    Comment:    (NOTE) Pre diabetes:          5.7%-6.4%  Diabetes:              >6.4%  Glycemic control for   <7.0% adults with diabetes   04/11/2008 03:43 AM   Final   5.7 (NOTE)   The ADA recommends the following therapeutic goals for glycemic   control related to Hgb A1C measurement:   Goal of Therapy:   < 7.0% Hgb A1C   Action Suggested:  > 8.0% Hgb A1C   Ref:  Diabetes Care, 22, Suppl. 1, 1999    CBG: Recent Labs  Lab 08/20/21 1317 08/20/21 1605 08/20/21 2033 08/21/21 0738 08/21/21 1126  GLUCAP 155* 249* 190* 130* 90    Critical care time: n/a    Freda Jackson, MD [facility] Office: [phone]   See Amion for personal pager PCCM on call pager [phone] until 7pm. Please call Elink 7p-7a. [phone]

## 2021-08-21 NOTE — Op Note (Signed)
Flexible and EBUS Bronchoscopy Procedure Note  Seth Simpson  [id number]  01/30/1931  Date:08/21/21  Time:4:50 PM   Provider Performing:Gerold Sar B Reka Wist   Procedure: Flexible bronchoscopy and EBUS Bronchoscopy  Indication(s) Left Hilar Mass, Mediastinal and hilar adenopathy  Consent Risks of the procedure as well as the alternatives and risks of each were explained to the patient and/or caregiver.  Consent for the procedure was obtained.  Anesthesia General Anesthesia   Time Out Verified patient identification, verified procedure, site/side was marked, verified correct patient position, special equipment/implants available, medications/allergies/relevant history reviewed, required imaging and test results available.   Sterile Technique Usual hand hygiene, masks, gowns, and gloves were used   Procedure Description Diagnostic bronchoscope advanced through endotracheal tube and into airway.  Airways were examined down to subsegmental level with findings noted below.  Following diagnostic evaluation of the airways, the diagnostic bronchoscope was then removed and the EBUS bronchoscope was advanced into airway with stations 4R, 7 and 11L/mass  biopsied and sent for slide, cell block, and flow cytometry.  The EBUS bronchoscope was removed after assuring no active bleeding from biopsy site.  Findings:  - mediastinal and hilar adenopathy - Normal appearing airways   Complications/Tolerance None; patient tolerated the procedure well. Chest X-ray is not needed post procedure.   EBL Minimal   Specimen(s) LN Station 4R, 7 and 11L/Mass sent for slide, cell block, and flow cytometry.

## 2021-08-21 NOTE — Anesthesia Procedure Notes (Signed)
Procedure Name: Intubation Date/Time: 08/21/2021 3:20 PM Performed by: Jearld Pies, CRNA Pre-anesthesia Checklist: Patient identified, Emergency Drugs available, Suction available and Patient being monitored Patient Re-evaluated:Patient Re-evaluated prior to induction Oxygen Delivery Method: Circle System Utilized Preoxygenation: Pre-oxygenation with 100% oxygen Induction Type: IV induction Ventilation: Mask ventilation without difficulty Laryngoscope Size: Miller and 2 Grade View: Grade I Tube type: Oral Tube size: 8.5 mm Number of attempts: 1 Airway Equipment and Method: Stylet Placement Confirmation: ETT inserted through vocal cords under direct vision, positive ETCO2 and breath sounds checked- equal and bilateral Secured at: 23 cm Tube secured with: Tape Dental Injury: Teeth and Oropharynx as per pre-operative assessment

## 2021-08-21 NOTE — Plan of Care (Signed)

## 2021-08-21 NOTE — Progress Notes (Signed)
Occupational Therapy Treatment Patient Details Name: Seth Simpson MRN: [id number] DOB: 1931/02/15 Today's Date: 08/21/2021    History of present illness 87 y/o presented to ED on 9/1 for DOE. CT chest concerning of L upper lobe mass and moderate L sided pleural effusion concerning for malignancy. Pt s/p thoracentesis 9/2 and 9/7. PMH: CAD s/p CABG, paroxysmal SVT on amiodarone, frequent PVCs, LBBB, type 2 diabetes, HTN, HLD, hypothyroidism, depression, BPH   OT comments  Patient very sleepy today, falling asleep quickly, but participates well and tries hard.  Incremental gains toward patient focused goals.  Able to sit at the edge of bed with better balance, and basic transfers with up to Min A at RW level.  Patient able to perform grooming task at sit/stand level with setup and Min Guard when standing.  Daughter asking about taking him home with [facility] and 24 hour care.  OT continues to recommend SNF for more intensive therapies and then transitioning home with [facility]  OT will continue to follow in the acute setting.     Follow Up Recommendations  SNF    Equipment Recommendations  None recommended by OT    Recommendations for Other Services      Precautions / Restrictions Precautions Precautions: Fall Precaution Comments: watch sats       Mobility Bed Mobility Overal bed mobility: Needs Assistance Bed Mobility: Supine to Sit     Supine to sit: Min assist          Transfers   Equipment used: Rolling walker (2 wheeled) Transfers: Sit to/from [facility] Sit to Stand: Min assist Stand pivot transfers: Min guard            Balance Overall balance assessment: Needs assistance Sitting-balance support: No upper extremity supported;Feet supported Sitting balance-Leahy Scale: Fair     Standing balance support: Bilateral upper extremity supported;During functional activity Standing balance-Leahy Scale: Poor Standing balance comment: reliant on UE support and  external assist                           ADL either performed or assessed with clinical judgement   ADL       Grooming: Wash/dry hands;Wash/dry face;Supervision/safety;Sitting;Oral care                   Toilet Transfer: Minimal assistance;RW;Squat-pivot;BSC             General ADL Comments: very sleepy today with red area noted to mid back on his spine.. reported RN                       Cognition Arousal/Alertness: Lethargic Behavior During Therapy: WFL for tasks assessed/performed Overall Cognitive Status: Impaired/Different from baseline                                                            Pertinent Vitals/ Pain       Pain Assessment: No/denies pain Pain Intervention(s): Monitored during session  Frequency  Min 2X/week        Progress Toward Goals  OT Goals(current goals can now be found in the care plan section)  Progress towards OT goals: Progressing toward goals  Acute Rehab OT Goals Patient Stated Goal: I'd like to move a little more OT Goal Formulation: With patient Time For Goal Achievement: 08/31/21 Potential to Achieve Goals: Good  Plan Discharge plan remains appropriate    Co-evaluation                 AM-PAC OT "6 Clicks" Daily Activity     Outcome Measure   Help from another person eating meals?: None Help from another person taking care of personal grooming?: A Little Help from another person toileting, which includes using toliet, bedpan, or urinal?: A Lot Help from another person bathing (including washing, rinsing, drying)?: A Lot Help from another person to put on and taking off regular upper body clothing?: A Little Help from another person to put on and taking off regular lower body clothing?: A Lot 6 Click Score: 16    End of Session Equipment Utilized During Treatment: Rolling  walker;Oxygen  OT Visit Diagnosis: Unsteadiness on feet (R26.81);Muscle weakness (generalized) (M62.81)   Activity Tolerance Patient tolerated treatment well   Patient Left in chair;with call bell/phone within reach;with chair alarm set;with family/visitor present   Nurse Communication Other (comment) (red area to mid back)        Time: 1310-1331 OT Time Calculation (min): 21 min  Charges: OT General Charges $OT Visit: 1 Visit OT Treatments $Self Care/Home Management : 8-22 mins  08/21/2021  RP, OTR/L  Acute Rehabilitation Services  Office:  [place] 08/21/2021, 2:07 PM

## 2021-08-21 NOTE — Plan of Care (Signed)
  Problem: Education: Goal: Knowledge of General Education information will improve Description: Including pain rating scale, medication(s)/side effects and non-pharmacologic comfort measures Outcome: Progressing   Problem: Activity: Goal: Risk for activity intolerance will decrease Outcome: Progressing   Problem: Nutrition: Goal: Adequate nutrition will be maintained Outcome: Progressing   Problem: Coping: Goal: Level of anxiety will decrease Outcome: Progressing   

## 2021-08-21 NOTE — Transfer of Care (Signed)
Immediate Anesthesia Transfer of Care Note  Patient: Seth Simpson  Procedure(s) Performed: VIDEO BRONCHOSCOPY WITH ENDOBRONCHIAL ULTRASOUND BRONCHIAL NEEDLE ASPIRATION BIOPSIES  Patient Location: PACU  Anesthesia Type:General  Level of Consciousness: drowsy, patient cooperative and responds to stimulation  Airway & Oxygen Therapy: Patient Spontanous Breathing and Patient connected to face mask oxygen  Post-op Assessment: Report given to RN and Post -op Vital signs reviewed and stable  Post vital signs: Reviewed and stable  Last Vitals:  Vitals Value Taken Time  BP 98/51 (66) 08/21/21 1700  Temp    Pulse 64 08/21/21 1702  Resp 16 08/21/21 1702  SpO2 94 % 08/21/21 1702  Vitals shown include unvalidated device data.  Last Pain:  Vitals:   08/21/21 1430  TempSrc: Temporal  PainSc: 0-No pain         Complications: No notable events documented.

## 2021-08-21 NOTE — Anesthesia Postprocedure Evaluation (Signed)
Anesthesia Post Note  Patient: Seth Simpson  Procedure(s) Performed: VIDEO BRONCHOSCOPY WITH ENDOBRONCHIAL ULTRASOUND BRONCHIAL NEEDLE ASPIRATION BIOPSIES     Patient location during evaluation: PACU Anesthesia Type: General Level of consciousness: awake and alert Pain management: pain level controlled Vital Signs Assessment: post-procedure vital signs reviewed and stable Respiratory status: spontaneous breathing, nonlabored ventilation, respiratory function stable and patient connected to nasal cannula oxygen Cardiovascular status: blood pressure returned to baseline and stable Postop Assessment: no apparent nausea or vomiting Anesthetic complications: no   No notable events documented.  Last Vitals:  Vitals:   08/21/21 1730 08/21/21 1745  BP: (!) 92/50 (!) 99/50  Pulse: 63 (!) 59  Resp: 15 15  Temp:  36.5 C  SpO2: 93% 93%    Last Pain:  Vitals:   08/21/21 1730  TempSrc:   PainSc: 0-No pain                 Effie Berkshire

## 2021-08-21 NOTE — Progress Notes (Signed)
PROGRESS NOTE    Seth Simpson  [id number] DOB: 03-29-1931 DOA: 08/13/2021 PCP: Josetta Huddle, MD    Brief Narrative:  87 year old with a history of CAD status post CABG, paroxysmal SVT on amiodarone, LBBB, DM2, HTN, HLD, hypothyroidism, depression, and BPH who presented to the ED with dyspnea.  CT chest in the ED revealed a possible left upper lobe lung mass and a moderate left-sided pleural effusion.  Patient was admitted to the acute units to undergo diagnostic and therapeutic thoracentesis.  Assessment & Plan:   Principal Problem:   Acute respiratory failure with hypoxia (HCC) Active Problems:   Coronary artery disease   Hypertension   Hypercholesteremia   PSVT (paroxysmal supraventricular tachycardia) (HCC)   Type II diabetes mellitus (HCC)   Mass of upper lobe of left lung   Pleural effusion    Acute hypoxic respiratory failure -left upper lobe/perihilar lung mass -left pleural effusion CT chest noted 4.0 cm medial left upper lobe/perihilar mass extending into the left hilum worrisome for bronchogenic neoplasm -thoracic nodal mets suspected -postobstructive opacity appreciated in the lingula and left lower lobe -underwent ultrasound-guided thoracentesis with cytology thus far unremarkable for malignancy Pt underwent bronch 9/9 with mass biopsied, pending resultes Discussed with Pulmonary. If pt becomes acutely sob, then could consider repeat thoracentesis, otherwise likely d/c soon   Acute kidney injury Felt to be prerenal azotemia - baseline creatinine appears to be 1.0 - ACE inhibitor stopped -volume resuscitated Cont to follow renal function trends  Hypercalcemia Suspect secondary to malignancy -asymptomatic for now  patient received hydration.  Calcium levels have remained stable Continue to follow electrolyte trends   Hyomagnesemia Corrected with supplementation   CAD status post CABG Continue Toprol and statin  Remains chest pain free this AM   Paroxysmal  SVT/frequent PVCs Amiodarone on hold with acute pulmonary illness -continue Toprol Remains rate controlled   RA Methotrexate remains on hold with concern for postobstructive pneumonia -monitor symptoms   DM2 A1c 7.2 -CBG reasonably controlled thus far   Hypothyroidism Continue usual Synthroid dose   Depression Continue Zoloft   HTN Blood pressure controlled at this time   HLD Continue usual statin    DVT prophylaxis: Lovenox subq Code Status: DNR Family Communication: Pt in room, family not at bedside  Status is: Inpatient  Remains inpatient appropriate because:Inpatient level of care appropriate due to severity of illness  Dispo: The patient is from: Home              Anticipated d/c is to: SNF              Patient currently is not medically stable to d/c.   Difficult to place patient No   Consultants:  Pulmonary  Procedures:  Thoracentesis  Antimicrobials: Anti-infectives (From admission, onward)    None       Subjective: Seen prior to bronchoscopy. Reports feeling largely unchanged from yesterday. Eager to have bronchoscopy performed  Objective: Vitals:   08/21/21 1404 08/21/21 1430 08/21/21 1700 08/21/21 1710  BP: (!) 99/52 93/70 (!) 98/51   Pulse: 65 65 64 62  Resp: '18 20 18 17  '$ Temp: 98.3 F (36.8 C) (!) 97.1 F (36.2 C) 99.1 F (37.3 C)   TempSrc: Oral Temporal    SpO2: 92% 94% 93% 92%  Weight:        Intake/Output Summary (Last 24 hours) at 08/21/2021 1717 Last data filed at 08/21/2021 1702 Gross per 24 hour  Intake 700 ml  Output 400 ml  Net  300 ml    Filed Weights   08/13/21 2200  Weight: 65.8 kg    Examination: General exam: Conversant, in no acute distress Respiratory system: normal chest rise, clear, no audible wheezing Cardiovascular system: regular rhythm, s1-s2 Gastrointestinal system: Nondistended, nontender, pos BS Central nervous system: No seizures, no tremors Extremities: No cyanosis, no joint deformities Skin:  No rashes, no pallor Psychiatry: Affect normal // no auditory hallucinations   Data Reviewed: I have personally reviewed following labs and imaging studies  CBC: Recent Labs  Lab 08/16/21 0107 08/17/21 0116 08/18/21 0124 08/19/21 0128 08/21/21 0045  WBC 11.9* 11.2* 12.0* 12.6* 12.1*  NEUTROABS 9.4* 8.9* 9.5* 9.8*  --   HGB 11.6* 12.8* 12.2* 12.3* 12.0*  HCT 34.9* 37.5* 37.1* 36.5* 36.8*  MCV 107.7* 107.1* 106.6* 105.8* 106.7*  PLT 209 220 226 216 [id number]    Basic Metabolic Panel: Recent Labs  Lab 08/15/21 0740 08/16/21 0107 08/17/21 0116 08/18/21 0124 08/19/21 0128 08/21/21 0045  NA 138 138 137 137 136 136  K 4.0 4.0 4.0 3.8 4.2 3.9  CL 114* 115* 110 113* 111 111  CO2 18* 17* 18* 19* 20* 20*  GLUCOSE 124* 128* 139* 116* 134* 126*  BUN 24* '22 19 18 19 20  '$ CREATININE 1.62* 1.53* 1.43* 1.37* 1.40* 1.44*  CALCIUM 10.6* 10.5* 10.2 10.3  8.3* 10.4* 9.0  MG  --   --   --  1.4* 1.8  --   PHOS 2.6 2.3* 2.3* 2.1* 2.2*  --     GFR: Estimated Creatinine Clearance: 33 mL/min (A) (by C-G formula based on SCr of 1.44 mg/dL (H)). Liver Function Tests: Recent Labs  Lab 08/16/21 0107 08/17/21 0116 08/18/21 0124 08/19/21 0128 08/21/21 0045  AST  --   --   --   --  34  ALT  --   --   --   --  28  ALKPHOS  --   --   --   --  78  BILITOT  --   --   --   --  0.6  PROT  --   --   --   --  4.9*  ALBUMIN 2.3* 2.3* 2.3* 2.2* 2.1*    No results for input(s): LIPASE, AMYLASE in the last 168 hours. No results for input(s): AMMONIA in the last 168 hours. Coagulation Profile: No results for input(s): INR, PROTIME in the last 168 hours. Cardiac Enzymes: No results for input(s): CKTOTAL, CKMB, CKMBINDEX, TROPONINI in the last 168 hours. BNP (last 3 results) No results for input(s): PROBNP in the last 8760 hours. HbA1C: No results for input(s): HGBA1C in the last 72 hours. CBG: Recent Labs  Lab 08/20/21 1605 08/20/21 2033 08/21/21 0738 08/21/21 1126 08/21/21 1706  GLUCAP 249*  190* 130* 90 115*    Lipid Profile: No results for input(s): CHOL, HDL, LDLCALC, TRIG, CHOLHDL, LDLDIRECT in the last 72 hours. Thyroid Function Tests: No results for input(s): TSH, T4TOTAL, FREET4, T3FREE, THYROIDAB in the last 72 hours. Anemia Panel: No results for input(s): VITAMINB12, FOLATE, FERRITIN, TIBC, IRON, RETICCTPCT in the last 72 hours. Sepsis Labs: No results for input(s): PROCALCITON, LATICACIDVEN in the last 168 hours.  Recent Results (from the past 240 hour(s))  Resp Panel by RT-PCR (Flu A&B, Covid) Nasopharyngeal Swab     Status: None   Collection Time: 08/13/21  4:36 PM   Specimen: Nasopharyngeal Swab; Nasopharyngeal(NP) swabs in vial transport medium  Result Value Ref Range Status   SARS Coronavirus 2 by  RT PCR NEGATIVE NEGATIVE Final    Comment: (NOTE) SARS-CoV-2 target nucleic acids are NOT DETECTED.  The SARS-CoV-2 RNA is generally detectable in upper respiratory specimens during the acute phase of infection. The lowest concentration of SARS-CoV-2 viral copies this assay can detect is 138 copies/mL. A negative result does not preclude SARS-Cov-2 infection and should not be used as the sole basis for treatment or other patient management decisions. A negative result may occur with  improper specimen collection/handling, submission of specimen other than nasopharyngeal swab, presence of viral mutation(s) within the areas targeted by this assay, and inadequate number of viral copies(<138 copies/mL). A negative result must be combined with clinical observations, patient history, and epidemiological information. The expected result is Negative.  Fact Sheet for Patients:  EntrepreneurPulse.com.au  Fact Sheet for Healthcare Providers:  IncredibleEmployment.be  This test is no t yet approved or cleared by the [place] FDA and  has been authorized for detection and/or diagnosis of SARS-CoV-2 by FDA under an Emergency Use  Authorization (EUA). This EUA will remain  in effect (meaning this test can be used) for the duration of the COVID-19 declaration under Section 564(b)(1) of the Act, 21 U.S.C.section 360bbb-3(b)(1), unless the authorization is terminated  or revoked sooner.       Influenza A by PCR NEGATIVE NEGATIVE Final   Influenza B by PCR NEGATIVE NEGATIVE Final    Comment: (NOTE) The Xpert Xpress SARS-CoV-2/FLU/RSV plus assay is intended as an aid in the diagnosis of influenza from Nasopharyngeal swab specimens and should not be used as a sole basis for treatment. Nasal washings and aspirates are unacceptable for Xpert Xpress SARS-CoV-2/FLU/RSV testing.  Fact Sheet for Patients: EntrepreneurPulse.com.au  Fact Sheet for Healthcare Providers: IncredibleEmployment.be  This test is not yet approved or cleared by the [place] FDA and has been authorized for detection and/or diagnosis of SARS-CoV-2 by FDA under an Emergency Use Authorization (EUA). This EUA will remain in effect (meaning this test can be used) for the duration of the COVID-19 declaration under Section 564(b)(1) of the Act, 21 U.S.C. section 360bbb-3(b)(1), unless the authorization is terminated or revoked.  Performed at [facility], [place] [address], [place], [place] [address]   Body fluid culture w Gram Stain     Status: None   Collection Time: 08/14/21 11:59 AM   Specimen: Lung, Left; Pleural Fluid  Result Value Ref Range Status   Specimen Description FLUID  Final   Special Requests LUNG LEFT  Final   Gram Stain   Final    FEW WBC PRESENT, PREDOMINANTLY MONONUCLEAR NO ORGANISMS SEEN    Culture   Final    NO GROWTH 3 DAYS Performed at [facility], [place] [address] [place], [place] [address]    Report Status 08/18/2021 FINAL  Final  Urine Culture     Status: None   Collection Time: 08/14/21  4:32 PM   Specimen: Urine, Clean Catch  Result Value Ref Range Status    Specimen Description URINE, CLEAN CATCH  Final   Special Requests NONE  Final   Culture   Final    NO GROWTH Performed at [facility], [name] [address], [place], [place] [address]    Report Status 08/16/2021 FINAL  Final      Radiology Studies: No results found.  Scheduled Meds:  [MAR Hold] allopurinol  300 mg Oral QPM   [MAR Hold] artificial tears   Both Eyes QHS   [MAR Hold] enoxaparin (LOVENOX) injection  30 mg Subcutaneous Q24H   [MAR Hold] ferrous sulfate  325 mg Oral QODAY   [MAR Hold] finasteride  5 mg Oral Daily   [MAR Hold] folic acid  3 mg Oral q morning   [MAR Hold] guaiFENesin  1,200 mg Oral BID   [MAR Hold] insulin aspart  0-9 Units Subcutaneous TID WC   [MAR Hold] levothyroxine  50 mcg Oral Q0600   [MAR Hold] loratadine  10 mg Oral q morning   [MAR Hold] metoprolol succinate  25 mg Oral Daily   [MAR Hold] mometasone-formoterol  2 puff Inhalation BID   [MAR Hold] pantoprazole  40 mg Oral Q0600   [MAR Hold] rosuvastatin  10 mg Oral Q M,W,F   [MAR Hold] sertraline  50 mg Oral q morning   [MAR Hold] sodium chloride  1 drop Both Eyes QID   [MAR Hold] sodium chloride flush  3 mL Intravenous Q12H   Continuous Infusions:  sodium chloride Stopped (08/19/21 0943)     LOS: 7 days   Marylu Lund, MD [facility] Pager On Amion  If 7PM-7AM, please contact night-coverage 08/21/2021, 5:17 PM

## 2021-08-21 NOTE — TOC Progression Note (Addendum)
Transition of Care [facility]) - Progression Note    Patient Details  Name: Seth Simpson MRN: [id number] Date of Birth: [birth year]  Transition of Care [facility]) CM/SW [place], RN Phone Number:[phone]  08/21/2021, 3:05 PM  Clinical Narrative:    CM spoke with daughter at the bedside. Patient does not have any new offers and daughter is not agreeable for placement at any of the current facilities that have given bed offers. Family is still undecided on placement or home care for the patient. Daughter reports that she has initiated profile for care.com in order to look for sitters at home for 24 hour care.         Expected Discharge Plan and Services                                                 Social Determinants of Health (SDOH) Interventions    Readmission Risk Interventions No flowsheet data found.

## 2021-08-22 ENCOUNTER — Encounter (HOSPITAL_COMMUNITY): Payer: Self-pay | Admitting: Pulmonary Disease

## 2021-08-22 DIAGNOSIS — E7800 Pure hypercholesterolemia, unspecified: Secondary | ICD-10-CM | POA: Diagnosis not present

## 2021-08-22 DIAGNOSIS — J9601 Acute respiratory failure with hypoxia: Secondary | ICD-10-CM | POA: Diagnosis not present

## 2021-08-22 LAB — CBC
HCT: 35.9 % — ABNORMAL LOW (ref 39.0–52.0)
Hemoglobin: 11.7 g/dL — ABNORMAL LOW (ref 13.0–17.0)
MCH: 35.1 pg — ABNORMAL HIGH (ref 26.0–34.0)
MCHC: 32.6 g/dL (ref 30.0–36.0)
MCV: 107.8 fL — ABNORMAL HIGH (ref 80.0–100.0)
Platelets: 267 10*3/uL (ref 150–400)
RBC: 3.33 MIL/uL — ABNORMAL LOW (ref 4.22–5.81)
RDW: 14.8 % (ref 11.5–15.5)
WBC: 13.9 10*3/uL — ABNORMAL HIGH (ref 4.0–10.5)
nRBC: 0 % (ref 0.0–0.2)

## 2021-08-22 LAB — COMPREHENSIVE METABOLIC PANEL
ALT: 38 U/L (ref 0–44)
AST: 56 U/L — ABNORMAL HIGH (ref 15–41)
Albumin: 1.9 g/dL — ABNORMAL LOW (ref 3.5–5.0)
Alkaline Phosphatase: 78 U/L (ref 38–126)
Anion gap: 11 (ref 5–15)
BUN: 25 mg/dL — ABNORMAL HIGH (ref 8–23)
CO2: 19 mmol/L — ABNORMAL LOW (ref 22–32)
Calcium: 8.5 mg/dL — ABNORMAL LOW (ref 8.9–10.3)
Chloride: 109 mmol/L (ref 98–111)
Creatinine, Ser: 1.33 mg/dL — ABNORMAL HIGH (ref 0.61–1.24)
GFR, Estimated: 51 mL/min — ABNORMAL LOW (ref >60)
Glucose, Bld: 166 mg/dL — ABNORMAL HIGH (ref 70–99)
Potassium: 4.8 mmol/L (ref 3.5–5.1)
Sodium: 139 mmol/L (ref 135–145)
Total Bilirubin: 0.8 mg/dL (ref 0.3–1.2)
Total Protein: 4.2 g/dL — ABNORMAL LOW (ref 6.5–8.1)

## 2021-08-22 LAB — GLUCOSE, CAPILLARY
Glucose-Capillary: 159 mg/dL — ABNORMAL HIGH (ref 70–99)
Glucose-Capillary: 341 mg/dL — ABNORMAL HIGH (ref 70–99)
Glucose-Capillary: 160 mg/dL — ABNORMAL HIGH (ref 70–99)
Glucose-Capillary: 230 mg/dL — ABNORMAL HIGH (ref 70–99)

## 2021-08-22 NOTE — TOC Transition Note (Addendum)
Transition of Care [facility]) - CM/SW Discharge Note   Patient Details  Name: Seth Simpson MRN: [id number] Date of Birth: [birth year]  [facility]) CM/SW Contact:  Carles Collet, RN Phone Number: 08/22/2021, 4:15 PM   Clinical Narrative:    Damaris Schooner w patient's daughter to discuss DC plans. She states she has been looking at private duty aids through care.com, asked for additional resources, suggested [employer], television/film/video and [facility]. She asked if additional SNFs had become available, told her there had not been. She is agreeable to home with home health services. We discussed HH ratings and StartupExpense[employer]be.  Reviewed 5 star agencies. [facility] able to start mid week. Diane agreed for [facility] agency to call her to set up appointment, [facility] aware.  Patient has RW, 3/1, and loaner WC available at home as well as a ramp, BP cuff and glucometer. Ordered rollator from [employer], it will be delivered to the room for DC tomorrow.   NEEDS order for home health PT OT RN [facility]     [place],Diane Daughter [phone]      Final next level of care: Home w Home Health Services Barriers to Discharge: Continued Medical Work up   Patient Goals and CMS Choice Patient states their goals for this hospitalization and ongoing recovery are:: to go home CMS Medicare.gov Compare Post Acute Care list provided to:: Other (Comment Required) Choice offered to / list presented to : Adult Children  Discharge Placement                       Discharge Plan and Services                DME Arranged: Walker rolling with seat DME Agency: [employer] Date DME Agency Contacted: 08/22/21 Time DME Agency Contacted: [phone] Representative spoke with at DME Agency: [place]: RN, PT, OT, Nurse's Aide [place] Agency: [place] Date [place]: 08/22/21 Time [place]: 1614 Representative spoke with at [place]: [place] ([place]) Interventions      Readmission Risk Interventions No flowsheet data found.

## 2021-08-22 NOTE — Progress Notes (Signed)
Occupational Therapy Treatment Patient Details Name: Seth Simpson MRN: [id number] DOB: 10-29-1931 Today's Date: 08/22/2021    History of present illness 87 y/o presented to ED on 9/1 for DOE. CT chest concerning of L upper lobe mass and moderate L sided pleural effusion concerning for malignancy. Pt s/p thoracentesis 9/2 and 9/7. PMH: CAD s/p CABG, paroxysmal SVT on amiodarone, frequent PVCs, LBBB, type 2 diabetes, HTN, HLD, hypothyroidism, depression, BPH   OT comments  Patient with fair gains toward overall patient focused goals.  He continues to need Min A for bed mobility, but tolerated up and walking to 10' to pre-positioned [facility] to simulate walking to bathroom.  Patient with LOB back, as he tens to rock back on his heels.  O2 sats droped to 91% on 4.5 L O2 via [place], but rebounded to 95% with therapeutic rest break.  OT to continue efforts in the acute setting, but he will need SNF to regain enough mobility and ADL independence to return home safely with less than 24 hour assist.    Follow Up Recommendations  SNF    Equipment Recommendations  Wheelchair (measurements OT);Wheelchair cushion (measurements OT)    Recommendations for Other Services      Precautions / Restrictions Precautions Precautions: Fall Precaution Comments: watch sats Restrictions Weight Bearing Restrictions: No       Mobility Bed Mobility Overal bed mobility: Needs Assistance Bed Mobility: Supine to Sit;Sit to Supine     Supine to sit: Min assist Sit to supine: Min assist   General bed mobility comments: assist to lift trunk from surface with multimodal cues and increased time.  VC's to reposition in the center of the bed. Patient Response: Cooperative  Transfers Overall transfer level: Needs assistance Equipment used: Rolling walker (2 wheeled) Transfers: Sit to/from Stand Sit to Stand: Min assist              Balance Overall balance assessment: Needs assistance Sitting-balance support: No  upper extremity supported;Feet supported Sitting balance-Leahy Scale: Fair     Standing balance support: Bilateral upper extremity supported;During functional activity Standing balance-Leahy Scale: Poor Standing balance comment: reliant on UE support and external assist                           ADL either performed or assessed with clinical judgement   ADL                           Toilet Transfer: Minimal assistance;RW;Ambulation;BSC Toilet Transfer Details (indicate cue type and reason): BSC set 10 feet away, and patient practiced mobility to and from [facility] to increase mobility, OOB and independence with toileting.           General ADL Comments: red area noted to mid back on his spine.. reported to RN who is aware.  Patient positioned on side.                       Cognition Arousal/Alertness: Awake/alert Behavior During Therapy: WFL for tasks assessed/performed   Area of Impairment: Safety/judgement;Orientation                 Orientation Level: Disoriented to;Time       Safety/Judgement: Decreased awareness of deficits  Pertinent Vitals/ Pain       Pain Assessment: Faces Faces Pain Scale: Hurts little more Pain Location: mid back where gait belt touched red area on his back. Pain Descriptors / Indicators: Tender Pain Intervention(s): Monitored during session;Repositioned                                                          Frequency  Min 2X/week        Progress Toward Goals  OT Goals(current goals can now be found in the care plan section)  Progress towards OT goals: Progressing toward goals  Acute Rehab OT Goals Patient Stated Goal: I'd like to move a little more OT Goal Formulation: With patient Time For Goal Achievement: 08/31/21 Potential to Achieve Goals: [place] Discharge plan remains appropriate    Co-evaluation                  AM-PAC OT "6 Clicks" Daily Activity     Outcome Measure   Help from another person eating meals?: None Help from another person taking care of personal grooming?: A Little Help from another person toileting, which includes using toliet, bedpan, or urinal?: A Lot Help from another person bathing (including washing, rinsing, drying)?: A Lot Help from another person to put on and taking off regular upper body clothing?: A Little Help from another person to put on and taking off regular lower body clothing?: A Lot 6 Click Score: 16    End of Session Equipment Utilized During Treatment: Rolling walker;Oxygen  OT Visit Diagnosis: Unsteadiness on feet (R26.81);Muscle weakness (generalized) (M62.81)   Activity Tolerance Patient tolerated treatment well   Patient Left in bed;with call bell/phone within reach;with bed alarm set   Nurse Communication          Time: [id number] OT Time Calculation (min): 21 min  Charges: OT General Charges $OT Visit: 1 Visit OT Treatments $Therapeutic Activity: 8-22 mins  08/22/2021  RP, OTR/L  Acute Rehabilitation Services  Office:  [phone]    Metta Clines 08/22/2021, 3:47 PM

## 2021-08-22 NOTE — Progress Notes (Signed)
PROGRESS NOTE    Seth Simpson  [id number] DOB: July 28, 1931 DOA: 08/13/2021 PCP: Josetta Huddle, MD    Brief Narrative:  87 year old with a history of CAD status post CABG, paroxysmal SVT on amiodarone, LBBB, DM2, HTN, HLD, hypothyroidism, depression, and BPH who presented to the ED with dyspnea.  CT chest in the ED revealed a possible left upper lobe lung mass and a moderate left-sided pleural effusion.  Patient was admitted to the acute units to undergo diagnostic and therapeutic thoracentesis.  Assessment & Plan:   Principal Problem:   Acute respiratory failure with hypoxia (HCC) Active Problems:   Coronary artery disease   Hypertension   Hypercholesteremia   PSVT (paroxysmal supraventricular tachycardia) (HCC)   Type II diabetes mellitus (HCC)   Mass of upper lobe of left lung   Pleural effusion    Acute hypoxic respiratory failure -left upper lobe/perihilar lung mass -left pleural effusion -CT chest noted 4.0 cm medial left upper lobe/perihilar mass extending into the left hilum worrisome for bronchogenic neoplasm -thoracic nodal mets suspected -postobstructive opacity appreciated in the lingula and left lower lobe -underwent ultrasound-guided thoracentesis with cytology thus far unremarkable for malignancy -Pt underwent bronch 9/9 with mass biopsied, pending resultes Discussed with Pulmonary. If pt becomes acutely sob, then could  -Pulmonary has since signed off. Family to decide on SNF vs HH. Will f/u with TOC   Acute kidney injury Felt to be prerenal azotemia - baseline creatinine appears to be 1.0 - ACE inhibitor stopped -volume resuscitated Cont to follow renal function trends  Hypercalcemia Suspect secondary to malignancy -asymptomatic for now  patient received hydration.  Calcium levels have remained stable, now within normal limits   Hyomagnesemia Corrected with supplementation   CAD status post CABG Continue Toprol and statin  Chest pain free this AM    Paroxysmal SVT/frequent PVCs Amiodarone on hold with acute pulmonary illness -continue Toprol Remains rate controlled   RA Methotrexate remains on hold with concern for postobstructive pneumonia -monitor symptoms   DM2 A1c 7.2 -CBG reasonably controlled thus far   Hypothyroidism Continue usual Synthroid dose   Depression Continue Zoloft   HTN Blood pressure controlled at this time   HLD Continue usual statin    DVT prophylaxis: Lovenox subq Code Status: DNR Family Communication: Pt in room, family not at bedside  Status is: Inpatient  Remains inpatient appropriate because:Inpatient level of care appropriate due to severity of illness  Dispo: The patient is from: Home              Anticipated d/c is to: SNF vs [facility]              Patient currently is not medically stable to d/c.   Difficult to place patient No   Consultants:  Pulmonary  Procedures:  Thoracentesis  Antimicrobials: Anti-infectives (From admission, onward)    None       Subjective: Reports feeling well this AM. Denies chest pain or worsened sob  Objective: Vitals:   08/21/21 2026 08/21/21 2027 08/22/21 0301 08/22/21 0806  BP:  (!) 104/57 (!) 108/54   Pulse:      Resp:      Temp: 98 F (36.7 C) 98 F (36.7 C) 98.1 F (36.7 C)   TempSrc: Axillary Axillary Axillary   SpO2:    95%  Weight:        Intake/Output Summary (Last 24 hours) at 08/22/2021 1516 Last data filed at 08/21/2021 1702 Gross per 24 hour  Intake 700 ml  Output --  Net 700 ml    Filed Weights   08/13/21 2200  Weight: 65.8 kg    Examination: General exam: Awake, laying in bed, in nad Respiratory system: Normal respiratory effort, no wheezing Cardiovascular system: regular rate, s1, s2 Gastrointestinal system: Soft, nondistended, positive BS Central nervous system: CN2-12 grossly intact, strength intact Extremities: Perfused, no clubbing Skin: Normal skin turgor, no notable skin lesions seen Psychiatry: Mood  normal // no visual hallucinations   Data Reviewed: I have personally reviewed following labs and imaging studies  CBC: Recent Labs  Lab 08/16/21 0107 08/17/21 0116 08/18/21 0124 08/19/21 0128 08/21/21 0045 08/22/21 0128  WBC 11.9* 11.2* 12.0* 12.6* 12.1* 13.9*  NEUTROABS 9.4* 8.9* 9.5* 9.8*  --   --   HGB 11.6* 12.8* 12.2* 12.3* 12.0* 11.7*  HCT 34.9* 37.5* 37.1* 36.5* 36.8* 35.9*  MCV 107.7* 107.1* 106.6* 105.8* 106.7* 107.8*  PLT 209 220 226 216 229 [id number]    Basic Metabolic Panel: Recent Labs  Lab 08/16/21 0107 08/17/21 0116 08/18/21 0124 08/19/21 0128 08/21/21 0045 08/22/21 0128  NA 138 137 137 136 136 139  K 4.0 4.0 3.8 4.2 3.9 4.8  CL 115* 110 113* 111 111 109  CO2 17* 18* 19* 20* 20* 19*  GLUCOSE 128* 139* 116* 134* 126* 166*  BUN '22 19 18 19 20 '$ 25*  CREATININE 1.53* 1.43* 1.37* 1.40* 1.44* 1.33*  CALCIUM 10.5* 10.2 10.3  8.3* 10.4* 9.0 8.5*  MG  --   --  1.4* 1.8  --   --   PHOS 2.3* 2.3* 2.1* 2.2*  --   --     GFR: Estimated Creatinine Clearance: 35.7 mL/min (A) (by C-G formula based on SCr of 1.33 mg/dL (H)). Liver Function Tests: Recent Labs  Lab 08/17/21 0116 08/18/21 0124 08/19/21 0128 08/21/21 0045 08/22/21 0128  AST  --   --   --  34 56*  ALT  --   --   --  28 38  ALKPHOS  --   --   --  78 78  BILITOT  --   --   --  0.6 0.8  PROT  --   --   --  4.9* 4.2*  ALBUMIN 2.3* 2.3* 2.2* 2.1* 1.9*    No results for input(s): LIPASE, AMYLASE in the last 168 hours. No results for input(s): AMMONIA in the last 168 hours. Coagulation Profile: No results for input(s): INR, PROTIME in the last 168 hours. Cardiac Enzymes: No results for input(s): CKTOTAL, CKMB, CKMBINDEX, TROPONINI in the last 168 hours. BNP (last 3 results) No results for input(s): PROBNP in the last 8760 hours. HbA1C: No results for input(s): HGBA1C in the last 72 hours. CBG: Recent Labs  Lab 08/21/21 1126 08/21/21 1706 08/21/21 2024 08/22/21 0752 08/22/21 1159  GLUCAP 90  115* 145* 159* 341*    Lipid Profile: No results for input(s): CHOL, HDL, LDLCALC, TRIG, CHOLHDL, LDLDIRECT in the last 72 hours. Thyroid Function Tests: No results for input(s): TSH, T4TOTAL, FREET4, T3FREE, THYROIDAB in the last 72 hours. Anemia Panel: No results for input(s): VITAMINB12, FOLATE, FERRITIN, TIBC, IRON, RETICCTPCT in the last 72 hours. Sepsis Labs: No results for input(s): PROCALCITON, LATICACIDVEN in the last 168 hours.  Recent Results (from the past 240 hour(s))  Resp Panel by RT-PCR (Flu A&B, Covid) Nasopharyngeal Swab     Status: None   Collection Time: 08/13/21  4:36 PM   Specimen: Nasopharyngeal Swab; Nasopharyngeal(NP) swabs in vial transport medium  Result Value  Ref Range Status   SARS Coronavirus 2 by RT PCR NEGATIVE NEGATIVE Final    Comment: (NOTE) SARS-CoV-2 target nucleic acids are NOT DETECTED.  The SARS-CoV-2 RNA is generally detectable in upper respiratory specimens during the acute phase of infection. The lowest concentration of SARS-CoV-2 viral copies this assay can detect is 138 copies/mL. A negative result does not preclude SARS-Cov-2 infection and should not be used as the sole basis for treatment or other patient management decisions. A negative result may occur with  improper specimen collection/handling, submission of specimen other than nasopharyngeal swab, presence of viral mutation(s) within the areas targeted by this assay, and inadequate number of viral copies(<138 copies/mL). A negative result must be combined with clinical observations, patient history, and epidemiological information. The expected result is Negative.  Fact Sheet for Patients:  EntrepreneurPulse.com.au  Fact Sheet for Healthcare Providers:  IncredibleEmployment.be  This test is no t yet approved or cleared by the [place] FDA and  has been authorized for detection and/or diagnosis of SARS-CoV-2 by FDA under an Emergency  Use Authorization (EUA). This EUA will remain  in effect (meaning this test can be used) for the duration of the COVID-19 declaration under Section 564(b)(1) of the Act, 21 U.S.C.section 360bbb-3(b)(1), unless the authorization is terminated  or revoked sooner.       Influenza A by PCR NEGATIVE NEGATIVE Final   Influenza B by PCR NEGATIVE NEGATIVE Final    Comment: (NOTE) The Xpert Xpress SARS-CoV-2/FLU/RSV plus assay is intended as an aid in the diagnosis of influenza from Nasopharyngeal swab specimens and should not be used as a sole basis for treatment. Nasal washings and aspirates are unacceptable for Xpert Xpress SARS-CoV-2/FLU/RSV testing.  Fact Sheet for Patients: EntrepreneurPulse.com.au  Fact Sheet for Healthcare Providers: IncredibleEmployment.be  This test is not yet approved or cleared by the [place] FDA and has been authorized for detection and/or diagnosis of SARS-CoV-2 by FDA under an Emergency Use Authorization (EUA). This EUA will remain in effect (meaning this test can be used) for the duration of the COVID-19 declaration under Section 564(b)(1) of the Act, 21 U.S.C. section 360bbb-3(b)(1), unless the authorization is terminated or revoked.  Performed at [facility], [place] [address], [place], [place] [address]   Body fluid culture w Gram Stain     Status: None   Collection Time: 08/14/21 11:59 AM   Specimen: Lung, Left; Pleural Fluid  Result Value Ref Range Status   Specimen Description FLUID  Final   Special Requests LUNG LEFT  Final   Gram Stain   Final    FEW WBC PRESENT, PREDOMINANTLY MONONUCLEAR NO ORGANISMS SEEN    Culture   Final    NO GROWTH 3 DAYS Performed at [facility], [place] [address], [place],  [address]    Report Status 08/18/2021 FINAL  Final  Urine Culture     Status: None   Collection Time: 08/14/21  4:32 PM   Specimen: Urine, Clean Catch  Result Value Ref Range Status    Specimen Description URINE, CLEAN CATCH  Final   Special Requests NONE  Final   Culture   Final    NO GROWTH Performed at [facility], [place] [address] [place],  [address]    Report Status 08/16/2021 FINAL  Final      Radiology Studies: No results found.  Scheduled Meds:  allopurinol  300 mg Oral QPM   artificial tears   Both Eyes QHS  enoxaparin (LOVENOX) injection  30 mg Subcutaneous Q24H   ferrous sulfate  325 mg Oral QODAY   finasteride  5 mg Oral Daily   folic acid  3 mg Oral q morning   guaiFENesin  1,200 mg Oral BID   insulin aspart  0-9 Units Subcutaneous TID WC   levothyroxine  50 mcg Oral Q0600   loratadine  10 mg Oral q morning   metoprolol succinate  25 mg Oral Daily   mometasone-formoterol  2 puff Inhalation BID   pantoprazole  40 mg Oral Q0600   rosuvastatin  10 mg Oral Q M,W,F   sertraline  50 mg Oral q morning   sodium chloride  1 drop Both Eyes QID   sodium chloride flush  3 mL Intravenous Q12H   Continuous Infusions:  sodium chloride Stopped (08/19/21 0943)     LOS: 8 days   Marylu Lund, MD [facility] Pager On Amion  If 7PM-7AM, please contact night-coverage 08/22/2021, 3:16 PM

## 2021-08-23 DIAGNOSIS — E7800 Pure hypercholesterolemia, unspecified: Secondary | ICD-10-CM | POA: Diagnosis not present

## 2021-08-23 DIAGNOSIS — J9601 Acute respiratory failure with hypoxia: Secondary | ICD-10-CM | POA: Diagnosis not present

## 2021-08-23 LAB — CBC
HCT: 35.6 % — ABNORMAL LOW (ref 39.0–52.0)
Hemoglobin: 11.5 g/dL — ABNORMAL LOW (ref 13.0–17.0)
MCH: 35.3 pg — ABNORMAL HIGH (ref 26.0–34.0)
MCHC: 32.3 g/dL (ref 30.0–36.0)
MCV: 109.2 fL — ABNORMAL HIGH (ref 80.0–100.0)
Platelets: 272 10*3/uL (ref 150–400)
RBC: 3.26 MIL/uL — ABNORMAL LOW (ref 4.22–5.81)
RDW: 14.9 % (ref 11.5–15.5)
WBC: 11.4 10*3/uL — ABNORMAL HIGH (ref 4.0–10.5)
nRBC: 0 % (ref 0.0–0.2)

## 2021-08-23 LAB — COMPREHENSIVE METABOLIC PANEL
ALT: 37 U/L (ref 0–44)
AST: 41 U/L (ref 15–41)
Albumin: 2 g/dL — ABNORMAL LOW (ref 3.5–5.0)
Alkaline Phosphatase: 79 U/L (ref 38–126)
Anion gap: 9 (ref 5–15)
BUN: 37 mg/dL — ABNORMAL HIGH (ref 8–23)
CO2: 17 mmol/L — ABNORMAL LOW (ref 22–32)
Calcium: 8.2 mg/dL — ABNORMAL LOW (ref 8.9–10.3)
Chloride: 111 mmol/L (ref 98–111)
Creatinine, Ser: 1.84 mg/dL — ABNORMAL HIGH (ref 0.61–1.24)
GFR, Estimated: 35 mL/min — ABNORMAL LOW (ref >60)
Glucose, Bld: 156 mg/dL — ABNORMAL HIGH (ref 70–99)
Potassium: 4.2 mmol/L (ref 3.5–5.1)
Sodium: 137 mmol/L (ref 135–145)
Total Bilirubin: 0.5 mg/dL (ref 0.3–1.2)
Total Protein: 5 g/dL — ABNORMAL LOW (ref 6.5–8.1)

## 2021-08-23 LAB — GLUCOSE, CAPILLARY
Glucose-Capillary: 214 mg/dL — ABNORMAL HIGH (ref 70–99)
Glucose-Capillary: 157 mg/dL — ABNORMAL HIGH (ref 70–99)
Glucose-Capillary: 174 mg/dL — ABNORMAL HIGH (ref 70–99)
Glucose-Capillary: 203 mg/dL — ABNORMAL HIGH (ref 70–99)

## 2021-08-23 LAB — URIC ACID: Uric Acid, Serum: 5.1 mg/dL (ref 3.7–8.6)

## 2021-08-23 MED ORDER — SODIUM CHLORIDE 0.9 % IV SOLN: INTRAVENOUS | Status: DC

## 2021-08-23 NOTE — Progress Notes (Signed)
PROGRESS NOTE    Seth Simpson  [id number] DOB: [birth year] DOA: 08/13/2021 PCP: Josetta Huddle, MD    Brief Narrative:  87 year old with a history of CAD status post CABG, paroxysmal SVT on amiodarone, LBBB, DM2, HTN, HLD, hypothyroidism, depression, and BPH who presented to the ED with dyspnea.  CT chest in the ED revealed a possible left upper lobe lung mass and a moderate left-sided pleural effusion.  Patient was admitted to the acute units to undergo diagnostic and therapeutic thoracentesis.  Assessment & Plan:   Principal Problem:   Acute respiratory failure with hypoxia (HCC) Active Problems:   Coronary artery disease   Hypertension   Hypercholesteremia   PSVT (paroxysmal supraventricular tachycardia) (HCC)   Type II diabetes mellitus (HCC)   Mass of upper lobe of left lung   Pleural effusion    Acute hypoxic respiratory failure -left upper lobe/perihilar lung mass -left pleural effusion -CT chest noted 4.0 cm medial left upper lobe/perihilar mass extending into the left hilum worrisome for bronchogenic neoplasm -thoracic nodal mets suspected -postobstructive opacity appreciated in the lingula and left lower lobe -underwent ultrasound-guided thoracentesis with cytology thus far unremarkable for malignancy -Pt underwent bronch 9/9 with mass biopsied, pending resultes Discussed with Pulmonary. If pt becomes acutely sob, then could  -Pulmonary has since signed off. Plan is for dispo to home with HH   Acute kidney injury Felt to be prerenal azotemia - baseline creatinine appears to be 1.0 - ACE inhibitor stopped Cr continues to rise today, up to 1.8 -Clinically dehydrated with dry mucus membranes, increased skin tenting -Will cont pt on basal IVF -Repeat bmet in AM  Hypercalcemia Suspect secondary to malignancy -asymptomatic for now  patient received hydration.  Calcium levels have remained stable, now within normal limits   Hyomagnesemia Corrected with  supplementation   CAD status post CABG Continue Toprol and statin  Remains chest pain free   Paroxysmal SVT/frequent PVCs Amiodarone on hold with acute pulmonary illness -continue Toprol Remains rate controlled   RA Methotrexate remains on hold with concern for postobstructive pneumonia -monitor symptoms   DM2 A1c 7.2 -CBG reasonably controlled thus far   Hypothyroidism Continue usual Synthroid dose   Depression Continue Zoloft   HTN Blood pressure controlled at this time   HLD Continue usual statin    DVT prophylaxis: Lovenox subq Code Status: DNR Family Communication: Pt in room, family not at bedside  Status is: Inpatient  Remains inpatient appropriate because:Inpatient level of care appropriate due to severity of illness  Dispo: The patient is from: Home              Anticipated d/c is to: Home w [facility]              Patient currently is not medically stable to d/c.   Difficult to place patient No   Consultants:  Pulmonary  Procedures:  Thoracentesis  Antimicrobials: Anti-infectives (From admission, onward)    None       Subjective: Denies chest pains today  Objective: Vitals:   08/23/21 0133 08/23/21 0542 08/23/21 0841 08/23/21 1100  BP:  (!) 104/58  (!) 101/57  Pulse:  60  62  Resp: '17 20  19  '$ Temp:  98.2 F (36.8 C)  97.8 F (36.6 C)  TempSrc:  Oral  Oral  SpO2:  92% 94% 95%  Weight:        Intake/Output Summary (Last 24 hours) at 08/23/2021 1511 Last data filed at 08/23/2021 0614 Gross per 24 hour  Intake 240 ml  Output 800 ml  Net -560 ml    Filed Weights   08/13/21 2200  Weight: 65.8 kg    Examination: General exam: Conversant, in no acute distress, dry mucus membranes Respiratory system: normal chest rise, clear, no audible wheezing Cardiovascular system: regular rhythm, s1-s2 Gastrointestinal system: Nondistended, nontender, pos BS Central nervous system: No seizures, no tremors Extremities: No cyanosis, no joint  deformities Skin: No rashes, no pallor, poor skin turgor Psychiatry: Affect normal // no auditory hallucinations   Data Reviewed: I have personally reviewed following labs and imaging studies  CBC: Recent Labs  Lab 08/17/21 0116 08/18/21 0124 08/19/21 0128 08/21/21 0045 08/22/21 0128 08/23/21 0357  WBC 11.2* 12.0* 12.6* 12.1* 13.9* 11.4*  NEUTROABS 8.9* 9.5* 9.8*  --   --   --   HGB 12.8* 12.2* 12.3* 12.0* 11.7* 11.5*  HCT 37.5* 37.1* 36.5* 36.8* 35.9* 35.6*  MCV 107.1* 106.6* 105.8* 106.7* 107.8* 109.2*  PLT 220 226 216 229 267 [id number]    Basic Metabolic Panel: Recent Labs  Lab 08/17/21 0116 08/18/21 0124 08/19/21 0128 08/21/21 0045 08/22/21 0128 08/23/21 0357  NA 137 137 136 136 139 137  K 4.0 3.8 4.2 3.9 4.8 4.2  CL 110 113* 111 111 109 111  CO2 18* 19* 20* 20* 19* 17*  GLUCOSE 139* 116* 134* 126* 166* 156*  BUN '19 18 19 20 '$ 25* 37*  CREATININE 1.43* 1.37* 1.40* 1.44* 1.33* 1.84*  CALCIUM 10.2 10.3  8.3* 10.4* 9.0 8.5* 8.2*  MG  --  1.4* 1.8  --   --   --   PHOS 2.3* 2.1* 2.2*  --   --   --     GFR: Estimated Creatinine Clearance: 25.8 mL/min (A) (by C-G formula based on SCr of 1.84 mg/dL (H)). Liver Function Tests: Recent Labs  Lab 08/18/21 0124 08/19/21 0128 08/21/21 0045 08/22/21 0128 08/23/21 0357  AST  --   --  34 56* 41  ALT  --   --  28 38 37  ALKPHOS  --   --  78 78 79  BILITOT  --   --  0.6 0.8 0.5  PROT  --   --  4.9* 4.2* 5.0*  ALBUMIN 2.3* 2.2* 2.1* 1.9* 2.0*    No results for input(s): LIPASE, AMYLASE in the last 168 hours. No results for input(s): AMMONIA in the last 168 hours. Coagulation Profile: No results for input(s): INR, PROTIME in the last 168 hours. Cardiac Enzymes: No results for input(s): CKTOTAL, CKMB, CKMBINDEX, TROPONINI in the last 168 hours. BNP (last 3 results) No results for input(s): PROBNP in the last 8760 hours. HbA1C: No results for input(s): HGBA1C in the last 72 hours. CBG: Recent Labs  Lab 08/22/21 1159  08/22/21 1618 08/22/21 2105 08/23/21 0811 08/23/21 1142  GLUCAP 341* 160* 230* 214* 157*    Lipid Profile: No results for input(s): CHOL, HDL, LDLCALC, TRIG, CHOLHDL, LDLDIRECT in the last 72 hours. Thyroid Function Tests: No results for input(s): TSH, T4TOTAL, FREET4, T3FREE, THYROIDAB in the last 72 hours. Anemia Panel: No results for input(s): VITAMINB12, FOLATE, FERRITIN, TIBC, IRON, RETICCTPCT in the last 72 hours. Sepsis Labs: No results for input(s): PROCALCITON, LATICACIDVEN in the last 168 hours.  Recent Results (from the past 240 hour(s))  Resp Panel by RT-PCR (Flu A&B, Covid) Nasopharyngeal Swab     Status: None   Collection Time: 08/13/21  4:36 PM   Specimen: Nasopharyngeal Swab; Nasopharyngeal(NP) swabs in vial transport medium  Result Value Ref Range Status   SARS Coronavirus 2 by RT PCR NEGATIVE NEGATIVE Final    Comment: (NOTE) SARS-CoV-2 target nucleic acids are NOT DETECTED.  The SARS-CoV-2 RNA is generally detectable in upper respiratory specimens during the acute phase of infection. The lowest concentration of SARS-CoV-2 viral copies this assay can detect is 138 copies/mL. A negative result does not preclude SARS-Cov-2 infection and should not be used as the sole basis for treatment or other patient management decisions. A negative result may occur with  improper specimen collection/handling, submission of specimen other than nasopharyngeal swab, presence of viral mutation(s) within the areas targeted by this assay, and inadequate number of viral copies(<138 copies/mL). A negative result must be combined with clinical observations, patient history, and epidemiological information. The expected result is Negative.  Fact Sheet for Patients:  EntrepreneurPulse.com.au  Fact Sheet for Healthcare Providers:  IncredibleEmployment.be  This test is no t yet approved or cleared by the [place] FDA and  has been authorized  for detection and/or diagnosis of SARS-CoV-2 by FDA under an Emergency Use Authorization (EUA). This EUA will remain  in effect (meaning this test can be used) for the duration of the COVID-19 declaration under Section 564(b)(1) of the Act, 21 U.S.C.section 360bbb-3(b)(1), unless the authorization is terminated  or revoked sooner.       Influenza A by PCR NEGATIVE NEGATIVE Final   Influenza B by PCR NEGATIVE NEGATIVE Final    Comment: (NOTE) The Xpert Xpress SARS-CoV-2/FLU/RSV plus assay is intended as an aid in the diagnosis of influenza from Nasopharyngeal swab specimens and should not be used as a sole basis for treatment. Nasal washings and aspirates are unacceptable for Xpert Xpress SARS-CoV-2/FLU/RSV testing.  Fact Sheet for Patients: EntrepreneurPulse.com.au  Fact Sheet for Healthcare Providers: IncredibleEmployment.be  This test is not yet approved or cleared by the [place] FDA and has been authorized for detection and/or diagnosis of SARS-CoV-2 by FDA under an Emergency Use Authorization (EUA). This EUA will remain in effect (meaning this test can be used) for the duration of the COVID-19 declaration under Section 564(b)(1) of the Act, 21 U.S.C. section 360bbb-3(b)(1), unless the authorization is terminated or revoked.  Performed at [facility], [place] [address], [place], [place] [address]   Body fluid culture w Gram Stain     Status: None   Collection Time: 08/14/21 11:59 AM   Specimen: Lung, Left; Pleural Fluid  Result Value Ref Range Status   Specimen Description FLUID  Final   Special Requests LUNG LEFT  Final   Gram Stain   Final    FEW WBC PRESENT, PREDOMINANTLY MONONUCLEAR NO ORGANISMS SEEN    Culture   Final    NO GROWTH 3 DAYS Performed at [facility], [place] [address], [place], [place] [address]    Report Status 08/18/2021 FINAL  Final  Urine Culture     Status: None   Collection Time: 08/14/21   4:32 PM   Specimen: Urine, Clean Catch  Result Value Ref Range Status   Specimen Description URINE, CLEAN CATCH  Final   Special Requests NONE  Final   Culture   Final    NO GROWTH Performed at [facility], [place] [address], [place], [place] [address]    Report Status 08/16/2021 FINAL  Final      Radiology Studies: No results found.  Scheduled Meds:  allopurinol  300 mg Oral QPM   artificial tears   Both Eyes QHS  enoxaparin (LOVENOX) injection  30 mg Subcutaneous Q24H   ferrous sulfate  325 mg Oral QODAY   finasteride  5 mg Oral Daily   folic acid  3 mg Oral q morning   guaiFENesin  1,200 mg Oral BID   insulin aspart  0-9 Units Subcutaneous TID WC   levothyroxine  50 mcg Oral Q0600   loratadine  10 mg Oral q morning   metoprolol succinate  25 mg Oral Daily   mometasone-formoterol  2 puff Inhalation BID   pantoprazole  40 mg Oral Q0600   rosuvastatin  10 mg Oral Q M,W,F   sertraline  50 mg Oral q morning   sodium chloride  1 drop Both Eyes QID   sodium chloride flush  3 mL Intravenous Q12H   Continuous Infusions:  sodium chloride Stopped (08/19/21 0943)   sodium chloride 75 mL/hr at 08/23/21 1340     LOS: 9 days   Marylu Lund, MD [facility] Pager On Amion  If 7PM-7AM, please contact night-coverage 08/23/2021, 3:11 PM

## 2021-08-23 NOTE — Plan of Care (Signed)

## 2021-08-23 NOTE — Plan of Care (Signed)

## 2021-08-24 DIAGNOSIS — J9601 Acute respiratory failure with hypoxia: Secondary | ICD-10-CM | POA: Diagnosis not present

## 2021-08-24 LAB — COMPREHENSIVE METABOLIC PANEL
ALT: 31 U/L (ref 0–44)
AST: 69 U/L — ABNORMAL HIGH (ref 15–41)
Albumin: 2 g/dL — ABNORMAL LOW (ref 3.5–5.0)
Alkaline Phosphatase: 90 U/L (ref 38–126)
Anion gap: 6 (ref 5–15)
BUN: 25 mg/dL — ABNORMAL HIGH (ref 8–23)
CO2: 19 mmol/L — ABNORMAL LOW (ref 22–32)
Calcium: 8 mg/dL — ABNORMAL LOW (ref 8.9–10.3)
Chloride: 115 mmol/L — ABNORMAL HIGH (ref 98–111)
Creatinine, Ser: 1.54 mg/dL — ABNORMAL HIGH (ref 0.61–1.24)
GFR, Estimated: 43 mL/min — ABNORMAL LOW (ref >60)
Glucose, Bld: 148 mg/dL — ABNORMAL HIGH (ref 70–99)
Potassium: 4 mmol/L (ref 3.5–5.1)
Sodium: 140 mmol/L (ref 135–145)
Total Bilirubin: 0.6 mg/dL (ref 0.3–1.2)
Total Protein: 5 g/dL — ABNORMAL LOW (ref 6.5–8.1)

## 2021-08-24 LAB — GLUCOSE, CAPILLARY
Glucose-Capillary: 148 mg/dL — ABNORMAL HIGH (ref 70–99)
Glucose-Capillary: 165 mg/dL — ABNORMAL HIGH (ref 70–99)
Glucose-Capillary: 197 mg/dL — ABNORMAL HIGH (ref 70–99)
Glucose-Capillary: 151 mg/dL — ABNORMAL HIGH (ref 70–99)

## 2021-08-24 MED ORDER — ALBUMIN HUMAN 25 % IV SOLN
25.0000 g | Freq: Once | INTRAVENOUS | Status: AC
  Administered 2021-08-24: 25 g via INTRAVENOUS
  Filled 2021-08-24: qty 100

## 2021-08-24 NOTE — TOC Initial Note (Signed)
Transition of Care [facility]) - Initial/Assessment Note    Patient Details  Name: Seth Simpson MRN: [id number] Date of Birth: 1931/10/10  Transition of [facility]) CM/SW Contact:    Angelita Ingles, RN Phone Number: 08/24/2021, 1:46 PM  Clinical Narrative:                 [facility] consulted for discharge planning. Patient reports that he is from home where he has been able to take care of himself independently. Daughter at bedside explains that patient is from home alone but she does not feel that he will be able to return home alone. CM discussed options for SNF with daughter. Daughter is agreeable to receiving medicare.gov list but expresses concern that she is not sure that this will be the best  plan for him. Patient manages at home with no HH assist or DME. TOC will continue to follow for discharge needs.  Expected Discharge Plan: [place] Barriers to Discharge: Continued Medical Work up   Patient Goals and CMS Choice Patient states their goals for this hospitalization and ongoing recovery are:: to go home CMS Medicare.gov Compare Post Acute Care list provided to:: Other (Comment Required) Choice offered to / list presented to : Adult Children  Expected Discharge Plan and Services Expected Discharge Plan: [place] In-house Referral: NA Discharge Planning Services: CM Consult   Living arrangements for the past 2 months: Single Family Home                 DME Arranged: Walker rolling with seat DME Agency: [employer] Date DME Agency Contacted: 08/22/21 Time DME Agency Contacted: [phone] Representative spoke with at DME Agency: [place]: RN, PT, OT, Nurse's Aide [place] Agency: [place] Date [place]: 08/22/21 Time [place]: 1614 Representative spoke with at [place]: [place] Arrangements/Services Living arrangements for the past 2 months: [place] Lives with:: Self Patient language  and need for interpreter reviewed:: Yes Do you feel safe going back to the place where you live?: Yes      Need for Family Participation in Patient Care: Yes (Comment) Care giver support system in place?: Yes (comment) Current home services:  (n/a) Criminal Activity/Legal Involvement Pertinent to Current Situation/Hospitalization: No - Comment as needed  Activities of Daily Living Home Assistive Devices/Equipment: Eyeglasses, Environmental consultant (specify type) ADL Screening (condition at time of admission) Patient's cognitive ability adequate to safely complete daily activities?: Yes Is the patient deaf or have difficulty hearing?: Yes Does the patient have difficulty seeing, even when wearing glasses/contacts?: Yes Does the patient have difficulty concentrating, remembering, or making decisions?: No Patient able to express need for assistance with ADLs?: Yes Does the patient have difficulty dressing or bathing?: Yes Independently performs ADLs?: Yes (appropriate for developmental age) Communication: Independent with device (comment) Dressing (OT): Appropriate for developmental age Grooming: Independent Feeding: Independent Bathing: Independent with device (comment) Toileting: Independent In/Out Bed: Independent Walks in Home: Independent with device (comment) Does the patient have difficulty walking or climbing stairs?: Yes Weakness of Legs: Both Weakness of Arms/Hands: None  Permission Sought/Granted Permission sought to share information with : Family Supports, Case Optician, dispensing granted to share information with : Yes, Verbal Permission Granted  Share Information with NAME: Lendon Ka     Permission granted to share info w Relationship: daughter     Emotional Assessment Appearance:: Appears stated age Attitude/Demeanor/Rapport: Gracious Affect (typically observed): Pleasant, Quiet Orientation: :  Oriented to Self, Oriented to Place, Oriented to  Time, Oriented to  Situation Alcohol / Substance Use: Not Applicable Psych Involvement: No (comment)  Admission diagnosis:  Pleural effusion [J90] Hypoxia [R09.02] Acute respiratory failure with hypoxia (HCC) [J96.01] Patient Active Problem List   Diagnosis Date Noted   Acute respiratory failure with hypoxia ([place]) 08/13/2021   Mass of upper lobe of left lung 08/13/2021   Pleural effusion 08/13/2021   Supraventricular tachycardia (HCC)    Myocardial infarction (HCC)    Type II diabetes mellitus (HCC)    Sinus headache    PVC's (premature ventricular contractions)    Left anterior fascicular block    PSVT (paroxysmal supraventricular tachycardia) ([place]) 04/22/2015   High risk medication use 04/22/2015   Aortic stenosis 04/22/2015   LAFB (left anterior fascicular block) 04/22/2015   SVT (supraventricular tachycardia) ([place]) 11/20/2014   PVC (premature ventricular contraction) 10/22/2014   Ventricular bigeminy 10/22/2014   Mild aortic stenosis 10/22/2014   Rheumatoid arthritis ([place]) 10/02/2013   Coronary artery disease    Old MI (myocardial infarction)    Hypertension    Heart murmur    Anemia    GERD (gastroesophageal reflux disease)    Arthritis    ASVD (arteriosclerotic vascular disease)    Hypercholesteremia    Decreased libido    Orchitis and epididymitis    Prostatitis    BPH (benign prostatic hyperplasia)    Bradycardia    Syncope    Bicuspid aortic valve    PCP:  Josetta Huddle, MD Pharmacy:   [id number] [place] [id number]: [phone]: [phone] [address] [facility] [id number]: [phone]: [phone]    Social Determinants of Health (SDOH) Interventions    Readmission Risk Interventions Readmission Risk Prevention Plan  08/24/2021  Transportation Screening Complete  PCP or Specialist Appt within 3-5 Days Complete  HRI or [place]  Social Work Consult for [place]anning/Counseling Complete  Palliative Care Screening Complete  Medication Review (Press photographer Complete  Some recent data might be hidden

## 2021-08-24 NOTE — Progress Notes (Signed)
PROGRESS NOTE    Seth Simpson  [id number] DOB: [birth year] DOA: 08/13/2021 PCP: Josetta Huddle, MD    Brief Narrative:  87 year old with a history of CAD status post CABG, paroxysmal SVT on amiodarone, LBBB, DM2, HTN, HLD, hypothyroidism, depression, and BPH who presented to the ED with dyspnea.  CT chest in the ED revealed a possible left upper lobe lung mass and a moderate left-sided pleural effusion.  Patient was admitted to the acute units to undergo diagnostic and therapeutic thoracentesis.  Assessment & Plan:   Principal Problem:   Acute respiratory failure with hypoxia (HCC) Active Problems:   Coronary artery disease   Hypertension   Hypercholesteremia   PSVT (paroxysmal supraventricular tachycardia) (HCC)   Type II diabetes mellitus (HCC)   Mass of upper lobe of left lung   Pleural effusion    Acute hypoxic respiratory failure -left upper lobe/perihilar lung mass -left pleural effusion -CT chest noted 4.0 cm medial left upper lobe/perihilar mass extending into the left hilum worrisome for bronchogenic neoplasm -thoracic nodal mets suspected -postobstructive opacity appreciated in the lingula and left lower lobe -underwent ultrasound-guided thoracentesis with cytology thus far unremarkable for malignancy -Pt underwent bronch 9/9 with mass biopsied, pending resultes Discussed with Pulmonary. If pt becomes acutely sob, then could  -Pulmonary has since signed off. Plan is for dispo to home with [place] on O2. Continue to wean as tolerated   Acute kidney injury Felt to be prerenal azotemia - baseline creatinine appears to be 1.0 - ACE inhibitor stopped Cr now starting to trend down, to 1.5 today -Remains clinically dehydrated with dry mucus membranes, increased skin tenting -Will cont pt on basal IVF -Recheck bmet in AM  Hypercalcemia Suspect secondary to malignancy -asymptomatic for now  patient received hydration.  Calcium levels have remained stable, now within  normal limits   Hyomagnesemia Corrected with supplementation   CAD status post CABG Continue Toprol and statin  Remains chest pain free at this time   Paroxysmal SVT/frequent PVCs Amiodarone on hold with acute pulmonary illness -continue Toprol Remains rate controlled   RA Methotrexate remains on hold with concern for postobstructive pneumonia -monitor symptoms   DM2 A1c 7.2 -CBG reasonably controlled thus far   Hypothyroidism Continue usual Synthroid dose   Depression Continue Zoloft   HTN Blood pressure controlled at this time   HLD Continue usual statin as tolerated    DVT prophylaxis: Lovenox subq Code Status: DNR Family Communication: Pt in room, family currently at bedside  Status is: Inpatient  Remains inpatient appropriate because:Inpatient level of care appropriate due to severity of illness  Dispo: The patient is from: Home              Anticipated d/c is to: Home w [facility]              Patient currently is not medically stable to d/c.   Difficult to place patient No   Consultants:  Pulmonary  Procedures:  Thoracentesis  Antimicrobials: Anti-infectives (From admission, onward)    None       Subjective: Feels thirsty. Eager to go home soon  Objective: Vitals:   08/23/21 2026 08/23/21 2123 08/24/21 0755 08/24/21 0832  BP:  (!) 115/58 138/76   Pulse:  74 63   Resp:  16 19   Temp:  98.5 F (36.9 C) 98.2 F (36.8 C)   TempSrc:  Oral Oral   SpO2: 91% 93% 94% 95%  Weight:        Intake/Output  Summary (Last 24 hours) at 08/24/2021 1445 Last data filed at 08/24/2021 1438 Gross per 24 hour  Intake 1142.08 ml  Output 1200 ml  Net -57.92 ml    Filed Weights   08/13/21 2200  Weight: 65.8 kg    Examination: General exam: Awake, laying in bed, in nad, membranes dry Respiratory system: Normal respiratory effort, no wheezing Cardiovascular system: regular rate, s1, s2 Gastrointestinal system: Soft, nondistended, positive BS Central  nervous system: CN2-12 grossly intact, strength intact Extremities: Perfused, no clubbing Skin: Normal skin turgor, no notable skin lesions seen Psychiatry: Mood normal // no visual hallucinations   Data Reviewed: I have personally reviewed following labs and imaging studies  CBC: Recent Labs  Lab 08/18/21 0124 08/19/21 0128 08/21/21 0045 08/22/21 0128 08/23/21 0357  WBC 12.0* 12.6* 12.1* 13.9* 11.4*  NEUTROABS 9.5* 9.8*  --   --   --   HGB 12.2* 12.3* 12.0* 11.7* 11.5*  HCT 37.1* 36.5* 36.8* 35.9* 35.6*  MCV 106.6* 105.8* 106.7* 107.8* 109.2*  PLT 226 216 229 267 [id number]    Basic Metabolic Panel: Recent Labs  Lab 08/18/21 0124 08/19/21 0128 08/21/21 0045 08/22/21 0128 08/23/21 0357 08/24/21 0227  NA 137 136 136 139 137 140  K 3.8 4.2 3.9 4.8 4.2 4.0  CL 113* 111 111 109 111 115*  CO2 19* 20* 20* 19* 17* 19*  GLUCOSE 116* 134* 126* 166* 156* 148*  BUN '18 19 20 '$ 25* 37* 25*  CREATININE 1.37* 1.40* 1.44* 1.33* 1.84* 1.54*  CALCIUM 10.3  8.3* 10.4* 9.0 8.5* 8.2* 8.0*  MG 1.4* 1.8  --   --   --   --   PHOS 2.1* 2.2*  --   --   --   --     GFR: Estimated Creatinine Clearance: 30.9 mL/min (A) (by C-G formula based on SCr of 1.54 mg/dL (H)). Liver Function Tests: Recent Labs  Lab 08/19/21 0128 08/21/21 0045 08/22/21 0128 08/23/21 0357 08/24/21 0227  AST  --  34 56* 41 69*  ALT  --  28 38 37 31  ALKPHOS  --  78 78 79 90  BILITOT  --  0.6 0.8 0.5 0.6  PROT  --  4.9* 4.2* 5.0* 5.0*  ALBUMIN 2.2* 2.1* 1.9* 2.0* 2.0*    No results for input(s): LIPASE, AMYLASE in the last 168 hours. No results for input(s): AMMONIA in the last 168 hours. Coagulation Profile: No results for input(s): INR, PROTIME in the last 168 hours. Cardiac Enzymes: No results for input(s): CKTOTAL, CKMB, CKMBINDEX, TROPONINI in the last 168 hours. BNP (last 3 results) No results for input(s): PROBNP in the last 8760 hours. HbA1C: No results for input(s): HGBA1C in the last 72  hours. CBG: Recent Labs  Lab 08/23/21 1142 08/23/21 1618 08/23/21 2237 08/24/21 0754 08/24/21 1208  GLUCAP 157* 174* 203* 148* 165*    Lipid Profile: No results for input(s): CHOL, HDL, LDLCALC, TRIG, CHOLHDL, LDLDIRECT in the last 72 hours. Thyroid Function Tests: No results for input(s): TSH, T4TOTAL, FREET4, T3FREE, THYROIDAB in the last 72 hours. Anemia Panel: No results for input(s): VITAMINB12, FOLATE, FERRITIN, TIBC, IRON, RETICCTPCT in the last 72 hours. Sepsis Labs: No results for input(s): PROCALCITON, LATICACIDVEN in the last 168 hours.  Recent Results (from the past 240 hour(s))  Urine Culture     Status: None   Collection Time: 08/14/21  4:32 PM   Specimen: Urine, Clean Catch  Result Value Ref Range Status   Specimen Description URINE, CLEAN  CATCH  Final   Special Requests NONE  Final   Culture   Final    NO GROWTH Performed at [facility], [place] [address] [place], [place] [address]    Report Status 08/16/2021 FINAL  Final      Radiology Studies: No results found.  Scheduled Meds:  allopurinol  300 mg Oral QPM   artificial tears   Both Eyes QHS   enoxaparin (LOVENOX) injection  30 mg Subcutaneous Q24H   ferrous sulfate  325 mg Oral QODAY   finasteride  5 mg Oral Daily   folic acid  3 mg Oral q morning   guaiFENesin  1,200 mg Oral BID   insulin aspart  0-9 Units Subcutaneous TID WC   levothyroxine  50 mcg Oral Q0600   loratadine  10 mg Oral q morning   metoprolol succinate  25 mg Oral Daily   mometasone-formoterol  2 puff Inhalation BID   pantoprazole  40 mg Oral Q0600   rosuvastatin  10 mg Oral Q M,W,F   sertraline  50 mg Oral q morning   sodium chloride  1 drop Both Eyes QID   sodium chloride flush  3 mL Intravenous Q12H   Continuous Infusions:  sodium chloride Stopped (08/19/21 0943)   sodium chloride 100 mL/hr at 08/24/21 1325     LOS: 10 days   Marylu Lund, MD [facility] Pager On Amion  If 7PM-7AM, please contact  night-coverage 08/24/2021, 2:45 PM

## 2021-08-24 NOTE — Progress Notes (Signed)
Physical Therapy Treatment Patient Details Name: Seth Simpson MRN: [id number] DOB: 1931-04-23 Today's Date: 08/24/2021   History of Present Illness 87 y/o presented to ED on 9/1 for DOE. CT chest concerning of L upper lobe mass and moderate L sided pleural effusion concerning for malignancy. Pt s/p thoracentesis 9/2 and 9/7. Bronchoscopy 9/9. PMH: CAD s/p CABG, paroxysmal SVT on amiodarone, frequent PVCs, LBBB, type 2 diabetes, HTN, HLD, hypothyroidism, depression, BPH    PT Comments    Pt tolerated treatment well with VSS on 4L of O2. Pt progressed to ambulation in hall, fatigue limiting, with min A and cueing to navigate RW and maintain upright posture.Pt d/c recommendation updated to [facility] per CSW note. Discussed with pt and pt agreeable to [facility] services with 24hr care. States pt/family are "figuring it out."  spO2 88-98% with inaccurate readings during ambulation.   Recommendations for follow up therapy are one component of a multi-disciplinary discharge planning process, led by the attending physician.  Recommendations may be updated based on patient status, additional functional criteria and insurance authorization.  Follow Up Recommendations  Home health PT;Supervision/Assistance - 24 hour     Equipment Recommendations  None recommended by PT    Recommendations for Other Services       Precautions / Restrictions Precautions Precautions: Fall Precaution Comments: watch sats Restrictions Weight Bearing Restrictions: No     Mobility  Bed Mobility Overal bed mobility: Needs Assistance Bed Mobility: Supine to Sit     Supine to sit: Min guard     General bed mobility comments: Min guard sit to stand with verbal cues to push into elbow and elevate trunk. Performed with increased time    Transfers Overall transfer level: Needs assistance Equipment used: Rolling walker (2 wheeled) Transfers: Sit to/from [facility] Sit to Stand: +2 physical assistance;From  elevated surface;Mod assist;Min assist Stand pivot transfers: Min guard       General transfer comment: Sit to stand x 4: 2 from bed, BSC, recliner. 1st attempt for sit to stand from bed required mod A +2 to boost to stand. 2nd attempt from elevated surface demonstrated improved performance with increased time. Verbal cues for hand placement. Min A +1 for the rest of transfers.  Ambulation/Gait Ambulation/Gait assistance: Min assist;+2 safety/equipment Gait Distance (Feet): 16 Feet Assistive device: Rolling walker (2 wheeled) Gait Pattern/deviations: Step-through pattern;Decreased stride length;Shuffle;Trunk flexed Gait velocity: decreased Gait velocity interpretation: <1.8 ft/sec, indicate of risk for recurrent falls General Gait Details: 37' then 65' with seated rests in between. Pt's sats >90% on 4L of O2. Verbal cues for upright position with min A to navigate RW. +2 for chair follow   Stairs             Wheelchair Mobility    Modified Rankin (Stroke Patients Only)       Balance Overall balance assessment: Needs assistance Sitting-balance support: No upper extremity supported;Feet supported Sitting balance-Leahy Scale: Fair     Standing balance support: Bilateral upper extremity supported;During functional activity Standing balance-Leahy Scale: Poor Standing balance comment: reliant on UE support and external assist                            Cognition Arousal/Alertness: Awake/alert Behavior During Therapy: WFL for tasks assessed/performed Overall Cognitive Status: Within Functional Limits for tasks assessed  Exercises General Exercises - Lower Extremity Long Arc Quad: AROM;Both;Seated;20 reps Hip Flexion/Marching: AROM;Both;Seated;20 reps    General Comments General comments (skin integrity, edema, etc.): VSS on 4L O2; spO2 88-98% with inaccurate readings during ambulation      Pertinent  Vitals/Pain Faces Pain Scale: Hurts a little bit Pain Location: uncertain Pain Intervention(s): Repositioned    Home Living                      Prior Function            PT Goals (current goals can now be found in the care plan section) Progress towards PT goals: Progressing toward goals    Frequency    Min 3X/week      PT Plan Discharge plan needs to be updated    Co-evaluation              AM-PAC PT "6 Clicks" Mobility   Outcome Measure  Help needed turning from your back to your side while in a flat bed without using bedrails?: A Little Help needed moving from lying on your back to sitting on the side of a flat bed without using bedrails?: A Little Help needed moving to and from a bed to a chair (including a wheelchair)?: A Little Help needed standing up from a chair using your arms (e.g., wheelchair or bedside chair)?: A Little Help needed to walk in hospital room?: A Little Help needed climbing 3-5 steps with a railing? : Total 6 Click Score: 16    End of Session Equipment Utilized During Treatment: Gait belt;Oxygen Activity Tolerance: Patient limited by fatigue Patient left: in chair;with chair alarm set;with call bell/phone within reach Nurse Communication: Mobility status PT Visit Diagnosis: Unsteadiness on feet (R26.81);Muscle weakness (generalized) (M62.81);History of falling (Z91.81);Difficulty in walking, not elsewhere classified (R26.2)     Time: [id number] PT Time Calculation (min) (ACUTE ONLY): 38 min  Charges:  $Gait Training: 8-22 mins $Therapeutic Activity: 8-22 mins                     Louie Casa, SPT Acute Rehab: [phone]    Domingo Dimes 08/24/2021, 10:58 AM

## 2021-08-25 ENCOUNTER — Inpatient Hospital Stay (HOSPITAL_COMMUNITY): Payer: Medicare Other

## 2021-08-25 DIAGNOSIS — E7800 Pure hypercholesterolemia, unspecified: Secondary | ICD-10-CM | POA: Diagnosis not present

## 2021-08-25 DIAGNOSIS — J9601 Acute respiratory failure with hypoxia: Secondary | ICD-10-CM | POA: Diagnosis not present

## 2021-08-25 LAB — COMPREHENSIVE METABOLIC PANEL
ALT: 27 U/L (ref 0–44)
AST: 30 U/L (ref 15–41)
Albumin: 2.3 g/dL — ABNORMAL LOW (ref 3.5–5.0)
Alkaline Phosphatase: 76 U/L (ref 38–126)
Anion gap: 5 (ref 5–15)
BUN: 15 mg/dL (ref 8–23)
CO2: 18 mmol/L — ABNORMAL LOW (ref 22–32)
Calcium: 7.6 mg/dL — ABNORMAL LOW (ref 8.9–10.3)
Chloride: 118 mmol/L — ABNORMAL HIGH (ref 98–111)
Creatinine, Ser: 1.26 mg/dL — ABNORMAL HIGH (ref 0.61–1.24)
GFR, Estimated: 55 mL/min — ABNORMAL LOW (ref >60)
Glucose, Bld: 142 mg/dL — ABNORMAL HIGH (ref 70–99)
Potassium: 3.6 mmol/L (ref 3.5–5.1)
Sodium: 141 mmol/L (ref 135–145)
Total Bilirubin: 0.7 mg/dL (ref 0.3–1.2)
Total Protein: 4.9 g/dL — ABNORMAL LOW (ref 6.5–8.1)

## 2021-08-25 LAB — GLUCOSE, CAPILLARY
Glucose-Capillary: 165 mg/dL — ABNORMAL HIGH (ref 70–99)
Glucose-Capillary: 176 mg/dL — ABNORMAL HIGH (ref 70–99)
Glucose-Capillary: 176 mg/dL — ABNORMAL HIGH (ref 70–99)
Glucose-Capillary: 100 mg/dL — ABNORMAL HIGH (ref 70–99)

## 2021-08-25 LAB — SURGICAL PATHOLOGY

## 2021-08-25 LAB — PTH-RELATED PEPTIDE: PTH-related peptide: <2 pmol/L

## 2021-08-25 NOTE — Progress Notes (Signed)
Pt has New onset of upper airway wheeze tht's audible from bedside.  Pt states this is new to him.  This is the second PRN tx given toight for the same issue.  Pt states he "feels like I can't breathe' but then settles down after tx.  Pt's upper airway wheeze becomes worse after tx but pt says he feels better.  RT will continue to monitor.

## 2021-08-25 NOTE — Progress Notes (Signed)
PROGRESS NOTE    Seth Simpson  [id number] DOB: 03-04-1931 DOA: 08/13/2021 PCP: Josetta Huddle, MD    Brief Narrative:  87 year old with a history of CAD status post CABG, paroxysmal SVT on amiodarone, LBBB, DM2, HTN, HLD, hypothyroidism, depression, and BPH who presented to the ED with dyspnea.  CT chest in the ED revealed a possible left upper lobe lung mass and a moderate left-sided pleural effusion.  Patient was admitted to the acute units to undergo diagnostic and therapeutic thoracentesis.  Assessment & Plan:   Principal Problem:   Acute respiratory failure with hypoxia (HCC) Active Problems:   Coronary artery disease   Hypertension   Hypercholesteremia   PSVT (paroxysmal supraventricular tachycardia) (HCC)   Type II diabetes mellitus (HCC)   Mass of upper lobe of left lung   Pleural effusion    Acute hypoxic respiratory failure -left upper lobe/perihilar lung mass -left pleural effusion -CT chest noted 4.0 cm medial left upper lobe/perihilar mass extending into the left hilum worrisome for bronchogenic neoplasm -thoracic nodal mets suspected -postobstructive opacity appreciated in the lingula and left lower lobe -underwent ultrasound-guided thoracentesis with cytology thus far unremarkable for malignancy -Pt underwent bronch 9/9 with mass biopsied, pending resultes -Over the weekend, noted to have increased resp effort. -Repeat CXR today reviewed, findings of mod-large effusion. Discussed with Pulmonary who will plan PleurX placement tomorrow   Acute kidney injury Felt to be prerenal azotemia - baseline creatinine appears to be 1.0 - ACE inhibitor stopped Cr now starting to trend down, to 1.2 today -Recently required gentle IVF, now on hold -Repeat bmet in AM  Hypercalcemia Suspect secondary to malignancy -asymptomatic for now  patient received hydration.   Repeat bmet in AM   Hyomagnesemia Corrected with supplementation   CAD status post CABG Continue Toprol  and statin  Remains chest pain free at this time   Paroxysmal SVT/frequent PVCs Amiodarone on hold with acute pulmonary illness -continue Toprol Remains rate controlled   RA Methotrexate remains on hold with concern for postobstructive pneumonia -monitor symptoms   DM2 A1c 7.2  -Glycemic trends stable -Continue SSI coverage   Hypothyroidism Continue usual Synthroid dose   Depression Continue Zoloft   HTN Blood pressure controlled at this time   HLD Continue usual statin as tolerated   DVT prophylaxis: Lovenox subq Code Status: DNR Family Communication: Pt in room, family currently at bedside  Status is: Inpatient  Remains inpatient appropriate because:Inpatient level of care appropriate due to severity of illness  Dispo: The patient is from: Home              Anticipated d/c is to: Home w [facility]              Patient currently is not medically stable to d/c.   Difficult to place patient No   Consultants:  Pulmonary  Procedures:  Thoracentesis  Antimicrobials: Anti-infectives (From admission, onward)    None       Subjective: States feeling well today  Objective: Vitals:   08/25/21 0021 08/25/21 0426 08/25/21 0643 08/25/21 1402  BP:    123/71  Pulse:    68  Resp:    (!) 22  Temp:  98 F (36.7 C)  98 F (36.7 C)  TempSrc:  Oral  Oral  SpO2: 95%  94% 97%  Weight:        Intake/Output Summary (Last 24 hours) at 08/25/2021 1640 Last data filed at 08/25/2021 1400 Gross per 24 hour  Intake 1322.48 ml  Output 1850 ml  Net -527.52 ml    Filed Weights   08/13/21 2200  Weight: 65.8 kg    Examination: General exam: Conversant, in no acute distress Respiratory system: slightly increased resp effort, clear, no audible wheezing Cardiovascular system: regular rhythm, s1-s2 Gastrointestinal system: Nondistended, nontender, pos BS Central nervous system: No seizures, no tremors Extremities: No cyanosis, no joint deformities Skin: No rashes, no  pallor Psychiatry: Affect normal // no auditory hallucinations   Data Reviewed: I have personally reviewed following labs and imaging studies  CBC: Recent Labs  Lab 08/19/21 0128 08/21/21 0045 08/22/21 0128 08/23/21 0357  WBC 12.6* 12.1* 13.9* 11.4*  NEUTROABS 9.8*  --   --   --   HGB 12.3* 12.0* 11.7* 11.5*  HCT 36.5* 36.8* 35.9* 35.6*  MCV 105.8* 106.7* 107.8* 109.2*  PLT 216 229 267 [id number]    Basic Metabolic Panel: Recent Labs  Lab 08/19/21 0128 08/21/21 0045 08/22/21 0128 08/23/21 0357 08/24/21 0227 08/25/21 0521  NA 136 136 139 137 140 141  K 4.2 3.9 4.8 4.2 4.0 3.6  CL 111 111 109 111 115* 118*  CO2 20* 20* 19* 17* 19* 18*  GLUCOSE 134* 126* 166* 156* 148* 142*  BUN 19 20 25* 37* 25* 15  CREATININE 1.40* 1.44* 1.33* 1.84* 1.54* 1.26*  CALCIUM 10.4* 9.0 8.5* 8.2* 8.0* 7.6*  MG 1.8  --   --   --   --   --   PHOS 2.2*  --   --   --   --   --     GFR: Estimated Creatinine Clearance: 37.7 mL/min (A) (by C-G formula based on SCr of 1.26 mg/dL (H)). Liver Function Tests: Recent Labs  Lab 08/21/21 0045 08/22/21 0128 08/23/21 0357 08/24/21 0227 08/25/21 0521  AST 34 56* 41 69* 30  ALT 28 38 37 31 27  ALKPHOS 78 78 79 90 76  BILITOT 0.6 0.8 0.5 0.6 0.7  PROT 4.9* 4.2* 5.0* 5.0* 4.9*  ALBUMIN 2.1* 1.9* 2.0* 2.0* 2.3*    No results for input(s): LIPASE, AMYLASE in the last 168 hours. No results for input(s): AMMONIA in the last 168 hours. Coagulation Profile: No results for input(s): INR, PROTIME in the last 168 hours. Cardiac Enzymes: No results for input(s): CKTOTAL, CKMB, CKMBINDEX, TROPONINI in the last 168 hours. BNP (last 3 results) No results for input(s): PROBNP in the last 8760 hours. HbA1C: No results for input(s): HGBA1C in the last 72 hours. CBG: Recent Labs  Lab 08/24/21 1555 08/24/21 2051 08/25/21 0823 08/25/21 1337 08/25/21 1533  GLUCAP 197* 151* 165* 176* 176*    Lipid Profile: No results for input(s): CHOL, HDL, LDLCALC, TRIG,  CHOLHDL, LDLDIRECT in the last 72 hours. Thyroid Function Tests: No results for input(s): TSH, T4TOTAL, FREET4, T3FREE, THYROIDAB in the last 72 hours. Anemia Panel: No results for input(s): VITAMINB12, FOLATE, FERRITIN, TIBC, IRON, RETICCTPCT in the last 72 hours. Sepsis Labs: No results for input(s): PROCALCITON, LATICACIDVEN in the last 168 hours.  No results found for this or any previous visit (from the past 240 hour(s)).     Radiology Studies: DG CHEST PORT 1 VIEW  Result Date: 08/25/2021 CLINICAL DATA:  Follow-up pleural effusion EXAM: PORTABLE CHEST 1 VIEW COMPARISON:  08/19/2021 FINDINGS: Cardiac shadow is stable. Postsurgical changes are again seen. Increasing bibasilar infiltrates are noted left greater than right with associated left-sided effusion which has also increased in the interval. No acute bony abnormality is noted. IMPRESSION: Increasing bibasilar infiltrates with  associated left-sided effusion of a moderate to large size. Electronically Signed   By: Inez Catalina M.D.   On: 08/25/2021 13:01    Scheduled Meds:  allopurinol  300 mg Oral QPM   artificial tears   Both Eyes QHS   enoxaparin (LOVENOX) injection  30 mg Subcutaneous Q24H   ferrous sulfate  325 mg Oral QODAY   finasteride  5 mg Oral Daily   folic acid  3 mg Oral q morning   guaiFENesin  1,200 mg Oral BID   insulin aspart  0-9 Units Subcutaneous TID WC   levothyroxine  50 mcg Oral Q0600   loratadine  10 mg Oral q morning   metoprolol succinate  25 mg Oral Daily   mometasone-formoterol  2 puff Inhalation BID   pantoprazole  40 mg Oral Q0600   rosuvastatin  10 mg Oral Q M,W,F   sertraline  50 mg Oral q morning   sodium chloride  1 drop Both Eyes QID   sodium chloride flush  3 mL Intravenous Q12H   Continuous Infusions:  sodium chloride Stopped (08/19/21 0943)     LOS: 11 days   Marylu Lund, MD [facility] Pager On Amion  If 7PM-7AM, please contact night-coverage 08/25/2021, 4:40 PM

## 2021-08-25 NOTE — Progress Notes (Addendum)
Occupational Therapy Treatment Patient Details Name: Seth Simpson MRN: [id number] DOB: 10-May-1931 Today's Date: 08/25/2021   History of present illness 87 y/o presented to ED on 9/1 for DOE. CT chest concerning of L upper lobe mass and moderate L sided pleural effusion concerning for malignancy. Pt s/p thoracentesis 9/2 and 9/7. Bronchoscopy 9/9. PMH: CAD s/p CABG, paroxysmal SVT on amiodarone, frequent PVCs, LBBB, type 2 diabetes, HTN, HLD, hypothyroidism, depression, BPH   OT comments  Patient in bed and stated he would like to get up to recliner.  Nursing asked monitor O2 during treatment.  Patient's O2 supine on 4 liters was 94.  Patient was min guard to sit on eob and was 93-94.  Patient performed standing from eob with min assist to stand and for balance and stood for 2 minutes with O2 at 94.  Patient ambulated to sink and back with min assist and continues to demonstrate 94 sats.  Patient transferred to recliner with min assist.  Nursing notified of O2 sats. Provided patient and family with handout and instructions on energy conservation. Acute OT to continue to follow.   Recommendations for follow up therapy are one component of a multi-disciplinary discharge planning process, led by the attending physician.  Recommendations may be updated based on patient status, additional functional criteria and insurance authorization.    Follow Up Recommendations  Home health OT;Supervision/Assistance - 24 hour    Equipment Recommendations  Wheelchair (measurements OT);Wheelchair cushion (measurements OT)    Recommendations for Other Services      Precautions / Restrictions Precautions Precautions: Fall Precaution Comments: watch sats       Mobility Bed Mobility Overal bed mobility: Needs Assistance Bed Mobility: Supine to Sit     Supine to sit: Min guard     General bed mobility comments: Able to get to eob with min guard to guide LE off bed    Transfers Overall transfer level:  Needs assistance Equipment used: Rolling walker (2 wheeled) Transfers: Sit to/from [facility] Sit to Stand: Min assist Stand pivot transfers: Min assist       General transfer comment: transferred to recliner from eob with assistance for balance    Balance Overall balance assessment: Needs assistance Sitting-balance support: No upper extremity supported;Feet supported Sitting balance-Leahy Scale: Fair Sitting balance - Comments: min guard to supervision for sitting balance   Standing balance support: Bilateral upper extremity supported;During functional activity Standing balance-Leahy Scale: Poor Standing balance comment: reliant on UE support and external assist                           ADL either performed or assessed with clinical judgement   ADL                                               Vision       Perception     Praxis      Cognition Arousal/Alertness: Awake/alert Behavior During Therapy: WFL for tasks assessed/performed Overall Cognitive Status: Within Functional Limits for tasks assessed Area of Impairment: Safety/judgement;Orientation                 Orientation Level: Disoriented to;Time       Safety/Judgement: Decreased awareness of deficits     General Comments: Patient required frequent cues for breathing technique  Exercises     Shoulder Instructions       General Comments      Pertinent Vitals/ Pain       Pain Assessment: No/denies pain  Home Living                                          Prior Functioning/Environment              Frequency  Min 2X/week        Progress Toward Goals  OT Goals(current goals can now be found in the care plan section)  Progress towards OT goals: Progressing toward goals  Acute Rehab OT Goals Patient Stated Goal: I'd like to move a little more OT Goal Formulation: With patient Time For Goal Achievement:  08/31/21 Potential to Achieve Goals: Fair ADL Goals Pt Will Perform Grooming: with set-up;sitting;standing Pt Will Perform Lower Body Bathing: with set-up;sit to/from stand Pt Will Perform Lower Body Dressing: with set-up;sit to/from stand Pt Will Transfer to Toilet: with supervision;ambulating;regular height toilet Pt Will Perform Toileting - Clothing Manipulation and hygiene: with supervision;sit to/from stand Pt/caregiver will Perform Home Exercise Program: Increased strength;Both right and left upper extremity;With theraband;With written HEP provided;With Supervision  Plan Discharge plan remains appropriate    Co-evaluation                 AM-PAC OT "6 Clicks" Daily Activity     Outcome Measure   Help from another person eating meals?: None Help from another person taking care of personal grooming?: A Little Help from another person toileting, which includes using toliet, bedpan, or urinal?: A Lot Help from another person bathing (including washing, rinsing, drying)?: A Lot Help from another person to put on and taking off regular upper body clothing?: A Little Help from another person to put on and taking off regular lower body clothing?: A Lot 6 Click Score: 16    End of Session Equipment Utilized During Treatment: Rolling walker;Oxygen  OT Visit Diagnosis: Unsteadiness on feet (R26.81);Muscle weakness (generalized) (M62.81)   Activity Tolerance Patient tolerated treatment well   Patient Left in chair;with call bell/phone within reach   Nurse Communication Other (comment) (informed nursing on patient's O2 sats)        Time: [id number] OT Time Calculation (min): 29 min  Charges: OT General Charges $OT Visit: 1 Visit OT Treatments $Therapeutic Activity: 23-37 mins  Lodema Hong, OTA   Trixie Dredge 08/25/2021, 12:38 PM

## 2021-08-26 ENCOUNTER — Inpatient Hospital Stay (HOSPITAL_COMMUNITY): Payer: Medicare Other

## 2021-08-26 ENCOUNTER — Encounter (HOSPITAL_COMMUNITY)
Admission: EM | Disposition: A | Discharge status: Hospice | Payer: Self-pay | Source: Home / Self Care | Attending: Internal Medicine

## 2021-08-26 DIAGNOSIS — N179 Acute kidney failure, unspecified: Secondary | ICD-10-CM | POA: Diagnosis present

## 2021-08-26 DIAGNOSIS — J9601 Acute respiratory failure with hypoxia: Secondary | ICD-10-CM | POA: Diagnosis not present

## 2021-08-26 DIAGNOSIS — E039 Hypothyroidism, unspecified: Secondary | ICD-10-CM | POA: Diagnosis present

## 2021-08-26 DIAGNOSIS — E878 Other disorders of electrolyte and fluid balance, not elsewhere classified: Secondary | ICD-10-CM | POA: Diagnosis present

## 2021-08-26 LAB — CBC WITH DIFFERENTIAL/PLATELET
Abs Immature Granulocytes: 0.27 10*3/uL — ABNORMAL HIGH (ref 0.00–0.07)
Basophils Absolute: 0.1 10*3/uL (ref 0.0–0.1)
Basophils Relative: 1 %
Eosinophils Absolute: 0.3 10*3/uL (ref 0.0–0.5)
Eosinophils Relative: 2 %
HCT: 36.9 % — ABNORMAL LOW (ref 39.0–52.0)
Hemoglobin: 12.1 g/dL — ABNORMAL LOW (ref 13.0–17.0)
Immature Granulocytes: 3 %
Lymphocytes Relative: 7 %
Lymphs Abs: 0.8 10*3/uL (ref 0.7–4.0)
MCH: 35.3 pg — ABNORMAL HIGH (ref 26.0–34.0)
MCHC: 32.8 g/dL (ref 30.0–36.0)
MCV: 107.6 fL — ABNORMAL HIGH (ref 80.0–100.0)
Monocytes Absolute: 1 10*3/uL (ref 0.1–1.0)
Monocytes Relative: 9 %
Neutro Abs: 8.6 10*3/uL — ABNORMAL HIGH (ref 1.7–7.7)
Neutrophils Relative %: 78 %
Platelets: 277 10*3/uL (ref 150–400)
RBC: 3.43 MIL/uL — ABNORMAL LOW (ref 4.22–5.81)
RDW: 15.1 % (ref 11.5–15.5)
WBC: 11 10*3/uL — ABNORMAL HIGH (ref 4.0–10.5)
nRBC: 0 % (ref 0.0–0.2)

## 2021-08-26 LAB — COMPREHENSIVE METABOLIC PANEL
ALT: 29 U/L (ref 0–44)
AST: 35 U/L (ref 15–41)
Albumin: 2.4 g/dL — ABNORMAL LOW (ref 3.5–5.0)
Alkaline Phosphatase: 74 U/L (ref 38–126)
Anion gap: 5 (ref 5–15)
BUN: 12 mg/dL (ref 8–23)
CO2: 16 mmol/L — ABNORMAL LOW (ref 22–32)
Calcium: 7.8 mg/dL — ABNORMAL LOW (ref 8.9–10.3)
Chloride: 117 mmol/L — ABNORMAL HIGH (ref 98–111)
Creatinine, Ser: 1.25 mg/dL — ABNORMAL HIGH (ref 0.61–1.24)
GFR, Estimated: 55 mL/min — ABNORMAL LOW (ref >60)
Glucose, Bld: 151 mg/dL — ABNORMAL HIGH (ref 70–99)
Potassium: 3.9 mmol/L (ref 3.5–5.1)
Sodium: 138 mmol/L (ref 135–145)
Total Bilirubin: 1 mg/dL (ref 0.3–1.2)
Total Protein: 5.2 g/dL — ABNORMAL LOW (ref 6.5–8.1)

## 2021-08-26 LAB — GLUCOSE, CAPILLARY
Glucose-Capillary: 159 mg/dL — ABNORMAL HIGH (ref 70–99)
Glucose-Capillary: 119 mg/dL — ABNORMAL HIGH (ref 70–99)
Glucose-Capillary: 186 mg/dL — ABNORMAL HIGH (ref 70–99)
Glucose-Capillary: 102 mg/dL — ABNORMAL HIGH (ref 70–99)

## 2021-08-26 LAB — HEPATITIS PANEL, ACUTE
HCV Ab: NONREACTIVE
Hep A IgM: NONREACTIVE
Hep B C IgM: NONREACTIVE
Hepatitis B Surface Ag: NONREACTIVE

## 2021-08-26 LAB — LACTIC ACID, PLASMA
Lactic Acid, Venous: 2 mmol/L (ref 0.5–1.9)
Lactic Acid, Venous: 2.5 mmol/L (ref 0.5–1.9)

## 2021-08-26 LAB — LACTATE DEHYDROGENASE: LDH: 336 U/L — ABNORMAL HIGH (ref 98–192)

## 2021-08-26 SURGERY — INSERTION, PLEURAL DRAINAGE CATHETER
Anesthesia: LOCAL

## 2021-08-26 MED ORDER — SODIUM CHLORIDE 0.9 % IV BOLUS
500.0000 mL | Freq: Once | INTRAVENOUS | Status: AC
  Administered 2021-08-26: 500 mL via INTRAVENOUS

## 2021-08-26 MED ORDER — AMLODIPINE BESYLATE 5 MG PO TABS
5.0000 mg | ORAL_TABLET | Freq: Every day | ORAL | Status: DC
  Administered 2021-08-27 – 2021-08-30 (×4): 5 mg via ORAL
  Filled 2021-08-26 (×4): qty 1

## 2021-08-26 MED ORDER — SODIUM CHLORIDE 0.9 % IV BOLUS
1000.0000 mL | Freq: Once | INTRAVENOUS | Status: AC
  Administered 2021-08-26: 1000 mL via INTRAVENOUS

## 2021-08-26 NOTE — Assessment & Plan Note (Signed)
Noted. Crestor continued.

## 2021-08-26 NOTE — Assessment & Plan Note (Signed)
Patient with hypercalcemia and hypomagnesemia upon admission. Hypercalcemia due to malignance. Now normalized when corrected for albumin. Hypomagnesia has been made replete. Monitor and address as necessary. Monitor on telemetry.

## 2021-08-26 NOTE — H&P (View-Only) (Signed)
NAME:  Seth Simpson, MRN:  [id number], DOB:  20-Apr-1931, LOS: 12 ADMISSION DATE:  08/13/2021, CONSULTATION DATE:  08/14/21 REFERRING MD:  Irine Seal, MD CHIEF COMPLAINT:  Lung Mass, Pleural Effusion   History of Present Illness:  Seth Simpson is an 87 year old male with CAD s/p CABG, DMII, hypertension, and hyperlipidemia who presented to the ER on 08/13/21 with shortness of breath.   The dyspnea was progressive over the past 2 weeks. He has a 10lbs weight loss over recent months and his appetite has decreased. Initial chest radiograph showed left pleural effusion and left hilar fullness. CT chest with contrast was then performed which showed 4cm left upper lobe/perihilar mass extending into the left hilum. Moderate to large left pleural effusion. He underwent thoracentesis by IR 9/2 with removal of 1.3L of amber fluid. He had a second thoracentesis on 9/7 with 1.4L removed. Both sets of pleural fluid were negative for malignant cells. He then under went EBUS on 9/9 with sampling of 4R, 7 and 11L lymph nodes showing non-hodgkin B cell lymphoma.   He is a former smoker, he quit at age 27 and has a 12 pack year smoking history. 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 He denies any harmful dust or chemical exposures. Denies any asbestos exposures.  Pertinent  Medical History  Coronary Artery Disease GERD Hypertension Hyperlipidemia Rheumatoid arthritis Diabetes Mellitus Type II  Significant Hospital Events: Including procedures, antibiotic start and stop dates in addition to other pertinent events   Admitted 9/1 for dyspnea, CT chest notable for left hilar mass and left pleural effusion 9/2 Thoracentesis with IR 9/7 Thoracentesis with pulmonary 9/8 EBUS with pulmonary  Interim History / Subjective:   Patient is having increased work of breathing. CXR 9/13 showing increased left pleural effusion and right  lower lobe infiltrates.   I discussed the pathology/cytology results with the patient. I left a VM for his daughter Seth Simpson so I have not discussed the diagnosis with her yet.  Objective   Blood pressure (!) 149/86, pulse 69, temperature 98.3 F (36.8 C), temperature source Oral, resp. rate 20, weight 65.8 kg, SpO2 95 %.        Intake/Output Summary (Last 24 hours) at 08/26/2021 0919 Last data filed at 08/26/2021 [id number] Gross per 24 hour  Intake 240 ml  Output 1650 ml  Net -1410 ml   Filed Weights   08/13/21 2200  Weight: 65.8 kg    Examination: General: Elderly male, thin, sitting up eating breakfast HENT: [place]/AT Lungs: Diminished breath sounds, > on left. No wheezing or rhonchi. Accessory muscle use. Cardiovascular: rrr, systolic murmur Abdomen: soft, non-tender, non-distended, bowel sounds present Extremities: warm, no edema Neuro: alert and awake.  GU: deferred  Resolved Hospital Problem list     Assessment & Plan:  Acute Hypoxemic Respiratory Failure Non-hodgkin B Cell Lymphoma leading to mediastinal and hilar lymphadenopathy with left pleural effusion Emphysema  - I have called Dr. Burr Medico of Oncology this morning for consult of his new diagnosis - Will hold off on pleurX placement as pleural effusion may improve rapidly with initiation of treatment for lymphoma.  - Will consider thoracentesis today if needed for symptom relief of his dyspnea and work of breathing. - Continue supplemental oxygen as needed to maintain SpO2 90-92% - Continue inhaler therapy and PRN nebulizers  Labs   CBC: Recent Labs  Lab 08/21/21 0045  08/22/21 0128 08/23/21 0357 08/26/21 0139  WBC 12.1* 13.9* 11.4* 11.0*  NEUTROABS  --   --   --  8.6*  HGB 12.0* 11.7* 11.5* 12.1*  HCT 36.8* 35.9* 35.6* 36.9*  MCV 106.7* 107.8* 109.2* 107.6*  PLT 229 267 272 [id number]    Basic Metabolic Panel: Recent Labs  Lab 08/22/21 0128 08/23/21 0357 08/24/21 0227 08/25/21 0521 08/26/21 0139  NA 139 137  140 141 138  K 4.8 4.2 4.0 3.6 3.9  CL 109 111 115* 118* 117*  CO2 19* 17* 19* 18* 16*  GLUCOSE 166* 156* 148* 142* 151*  BUN 25* 37* 25* 15 12  CREATININE 1.33* 1.84* 1.54* 1.26* 1.25*  CALCIUM 8.5* 8.2* 8.0* 7.6* 7.8*   GFR: Estimated Creatinine Clearance: 38 mL/min (A) (by C-G formula based on SCr of 1.25 mg/dL (H)). Recent Labs  Lab 08/21/21 0045 08/22/21 0128 08/23/21 0357 08/26/21 0139  WBC 12.1* 13.9* 11.4* 11.0*    Liver Function Tests: Recent Labs  Lab 08/22/21 0128 08/23/21 0357 08/24/21 0227 08/25/21 0521 08/26/21 0139  AST 56* 41 69* 30 35  ALT 38 37 '31 27 29  '$ ALKPHOS 78 79 90 76 74  BILITOT 0.8 0.5 0.6 0.7 1.0  PROT 4.2* 5.0* 5.0* 4.9* 5.2*  ALBUMIN 1.9* 2.0* 2.0* 2.3* 2.4*   No results for input(s): LIPASE, AMYLASE in the last 168 hours. No results for input(s): AMMONIA in the last 168 hours.  ABG    Component Value Date/Time   PHART 7.380 04/11/2008 2044   PCO2ART 37.0 04/11/2008 2044   PO2ART 122.0 (H) 04/11/2008 2044   HCO3 21.8 04/11/2008 2044   TCO2 22 07/26/2017 0715   ACIDBASEDEF 3.0 (H) 04/11/2008 2044   O2SAT 99.0 04/11/2008 2044     Coagulation Profile: No results for input(s): INR, PROTIME in the last 168 hours.  Cardiac Enzymes: No results for input(s): CKTOTAL, CKMB, CKMBINDEX, TROPONINI in the last 168 hours.  HbA1C: Hgb A1c MFr Bld  Date/Time Value Ref Range Status  08/14/2021 03:22 AM 7.2 (H) 4.8 - 5.6 % Final    Comment:    (NOTE) Pre diabetes:          5.7%-6.4%  Diabetes:              >6.4%  Glycemic control for   <7.0% adults with diabetes   04/11/2008 03:43 AM   Final   5.7 (NOTE)   The ADA recommends the following therapeutic goals for glycemic   control related to Hgb A1C measurement:   Goal of Therapy:   < 7.0% Hgb A1C   Action Suggested:  > 8.0% Hgb A1C   Ref:  Diabetes Care, 22, Suppl. 1, 1999    CBG: Recent Labs  Lab 08/25/21 0823 08/25/21 1337 08/25/21 1533 08/25/21 1947 08/26/21 0749  GLUCAP  165* 176* 176* 100* 159*    Critical care time: n/a    Freda Jackson, MD [facility] Office: [phone]   See Amion for personal pager PCCM on call pager [phone] until 7pm. Please call Elink 7p-7a. [phone]

## 2021-08-26 NOTE — Consult Note (Signed)
[place] [phone]   Fax:([phone]) [place] NOTE  Patient Care Team: Josetta Huddle, MD as PCP - General (Internal Medicine) Jerline Pain, MD as PCP - Cardiology (Cardiology)  Hematological/Oncological History # Diffuse Large B Cell Lymphoma, Staging in Process 08/13/2021 presented to [facility] ED with shortness of breath. CT scan showed a 4.0 cm medial left upper lobe/perihilar mass extending into the left hilum concerning for primary bronchogenic neoplasm.  Associated thoracic nodal metastases, moderate left pleural effusion, and postobstructive opacity/atelectasis in the lingula and left lower lobe noted 08/19/2021: thoracentesis performed.  08/21/2021: bronchoscopy performed, FNA obtained of enlarged throacic lymph nodes. Findings consistent with DLBCL.  08/26/2021: consult to Oncology  CHIEF COMPLAINTS/PURPOSE OF CONSULTATION:  "Diffuse Large B Cell Lymphoma "  HISTORY OF PRESENTING ILLNESS:  Seth Simpson 87 y.o. male with medical history significant for CAD, GERD, gout, HLD, HTN, RA and DM Type II who presented with shortness of breath and was found to have DLBCL with pleural effusion and perihilar mass.   On review of the previous records Seth Simpson initially presented the emergency department on 08/14/2019 with shortness of breath.  CT scan of the time showed a 4.0 cm medial left upper lobe/perihilar mass extending into the left hilum concerning for bronchogenic neoplasm.  He is also seen to have associated thoracic nodal metastases. A thoracentesis was performed on 08/19/2021. Bronchoscopy was performed on 08/21/2021 with an FNA performed of the enlarged thoracic nodes. Initial pathology results returned as NHL, most consistent with DLBCL.   On exam today Seth Simpson is accompanied by his daughter. He reports that he has been weak and deconditioned. He is not entirely sure why he was admitted, but his daughter helps him with the history. He denies any  fevers, chills, sweats. Appetite is good. He has had no other major changes in his health other than shortness of breath. A full 10 point ROS is listed below.   MEDICAL HISTORY:  Past Medical History:  Diagnosis Date   Bicuspid aortic valve    Bladder tumor    BPH (benign prostatic hypertrophy)    Coronary artery disease cardiologist-  dr Marlou Porch   a. s/p CABG  x5  03/2008 - presentation acute STEMI   Depression    Erectile dysfunction    GERD (gastroesophageal reflux disease)    Gout    per pt 07-14-2017 stable   Heart murmur    Hiatal hernia    History of bladder stone    History of kidney stones    History of prostatitis    and hx orchitis   History of ST elevation myocardial infarction (STEMI)    04-10-2008  acute inferior   History of syncope    2011 sinoatrial node dysfunction  and 11-20-2014 presyncope w/ wide complex tachycardia resolved  w/ valvasa and adenosine   Hyperlipidemia    Hypertension    Iron deficiency anemia    LAFB (left anterior fascicular block)    Lower urinary tract symptoms (LUTS)    Mild aortic stenosis    echo 04-23-2014  Valve Area 1.69 cm^2;  last echo 12-09-2016 valve area 1.47cm^2   OA (osteoarthritis)    PSVT (paroxysmal supraventricular tachycardia) (HCC)    PVC's (premature ventricular contractions)    Rheumatoid arthritis ([place]) 10/02/2013   Dr. Lenna Gilford, methotrexate   S/P CABG x 5    04-11-2008   Sigmoid diverticulosis    Type II diabetes mellitus ([place])    Wears glasses  SURGICAL HISTORY: Past Surgical History:  Procedure Laterality Date   [place]  04/06/2012   Procedure: BALLOON DILATION;  Surgeon: Garlan Fair, MD;  Location: [facility] ENDOSCOPY;  Service: Endoscopy;  Laterality: N/A;   BRONCHIAL NEEDLE ASPIRATION BIOPSY  08/21/2021   Procedure: BRONCHIAL NEEDLE ASPIRATION BIOPSIES;  Surgeon: Freddi Starr, MD;  Location: [facility] ENDOSCOPY;  Service: Pulmonary;;   CARDIAC CATHETERIZATION  04-10-2008  dr  Daneen Schick   3 vessel CAD   CATARACT EXTRACTION W/ INTRAOCULAR LENS  IMPLANT, BILATERAL Bilateral 2003   CHOLECYSTECTOMY OPEN  1978   COLONOSCOPY  04/06/2012   Procedure: COLONOSCOPY;  Surgeon: Garlan Fair, MD;  Location: [facility] ENDOSCOPY;  Service: Endoscopy;  Laterality: N/A;   CORONARY ARTERY BYPASS GRAFT  04-11-2008   LIMA to RI,  SVG to D1,  SVG to LAD,  sequetial SVG to Acute Marginal and pRCA   CYSTOSCOPY W/ RETROGRADES Bilateral 07/26/2017   Procedure: CYSTOSCOPY WITH BILATERAL  RETROGRADE PYELOGRAM;  Surgeon: Irine Seal, MD;  Location: [facility]  Service: Urology;  Laterality: Bilateral;   CYSTOSCOPY WITH BIOPSY N/A 06/17/2016   Procedure: CYSTOSCOPY WITH BIOPSY;  Surgeon: Irine Seal, MD;  Location: [facility];  Service: Urology;  Laterality: N/A;   CYSTOSCOPY WITH BIOPSY N/A 07/26/2017   Procedure: urethral dilation CYSTOSCOPY WITH BIOPSY AND FULGURATION, removal of bladder stone;  Surgeon: Irine Seal, MD;  Location[facility];  Service: Urology;  Laterality: N/A;   CYSTOSCOPY WITH LITHOLAPAXY N/A 06/17/2016   Procedure: CYSTOSCOPY WITH LITHOLAPAXY;  Surgeon: Irine Seal, MD;  Location: [facility];  Service: Urology;  Laterality: N/A;   CYSTOSCOPY WITH RETROGRADE PYELOGRAM, URETEROSCOPY AND STENT PLACEMENT  06-17-2016  dr Irine Seal   Cysto/  Bilateral RTG's/  Bladder bx and fulgeration bladder tumor's left & right lateral wall's/  Litholapaxy bladder stone/  left ureteroscopy/  Insertion bilateral double J stents   CYSTOSCOPY WITH RETROGRADE PYELOGRAM, URETEROSCOPY AND STENT PLACEMENT Bilateral 06/17/2016   Procedure: CYSTOSCOPY BILATERAL RETROGRADE,  LEFT URETEROSCOPY AND BILATERAL STENT PLACEMENT;  Surgeon: Irine Seal, MD;  Location: [facility];  Service: Urology;  Laterality: Bilateral;   CYSTOSCOPY WITH URETEROSCOPY AND STENT PLACEMENT Bilateral 07/01/2016   Procedure: BILATERAL URETEROSCOPY STONE  EXTRACTIONS WITH POSSIBLE STENT PLACEMENT;  Surgeon: Irine Seal, MD;  Location: [facility];  Service: Urology;  Laterality: Bilateral;   ESOPHAGOGASTRODUODENOSCOPY  04/06/2012   Procedure: ESOPHAGOGASTRODUODENOSCOPY (EGD);  Surgeon: Garlan Fair, MD;  Location: [facility];  Service: Endoscopy;  Laterality: N/A;   FINGER SURGERY Left 2000's   "removed BB", 2nd digit   HOLMIUM LASER APPLICATION N/A 05/13/4430   Procedure: HOLMIUM LASER APPLICATION;  Surgeon: Irine Seal, MD;  Location: [facility];  Service: Urology;  Laterality: N/A;   HOLMIUM LASER APPLICATION Bilateral 5/40/0867   Procedure: HOLMIUM LASER APPLICATION;  Surgeon: Irine Seal, MD;  Location: [facility];  Service: Urology;  Laterality: Bilateral;   IR THORACENTESIS ASP PLEURAL SPACE W/IMG GUIDE  08/14/2021   THORACENTESIS Left 08/19/2021   Procedure: THORACENTESIS;  Surgeon: Freddi Starr, MD;  Location: [facility] ENDOSCOPY;  Service: Pulmonary;  Laterality: Left;   TOTAL KNEE ARTHROPLASTY Bilateral right 03-20-2008//  left 08-23-2007   TRANSTHORACIC ECHOCARDIOGRAM  12-09-2016 dr Marlou Porch   mild LVH,  ef 50-55%/  Bicuspid AV with stenosis, valve area 1.47 cm^2 (previous echo it was 1.67cm^2 on 04-23-2014), trivial regurg/  mild LAE/ mild MR/  moderate TR, PASP 69mHg/    VIDEO BRONCHOSCOPY WITH ENDOBRONCHIAL ULTRASOUND N/A 08/21/2021   Procedure: VIDEO BRONCHOSCOPY WITH ENDOBRONCHIAL ULTRASOUND;  Surgeon: DFreddi Starr MD;  Location: [facility]  Service: Pulmonary;  Laterality: N/A;    SOCIAL HISTORY: Social History   Socioeconomic History   Marital status: Married    Spouse name: Not on file   Number of children: Not on file   Years of education: Not on file   Highest education level: Not on file  Occupational History   Not on file  Tobacco Use   Smoking status: Former    Packs/day: 1.00    Years: 15.00    Pack years: 15.00    Types: Cigarettes    Quit date: 09/29/1964     Years since quitting: 56.9   Smokeless tobacco: Never  Substance and Sexual Activity   Alcohol use: Yes    Alcohol/week: 2.0 standard drinks    Types: 2 Glasses of wine per week   Drug use: No   Sexual activity: Not on file  Other Topics Concern   Not on file  Social History Narrative   Not on file   Social Determinants of Health   Financial Resource Strain: Not on file  Food Insecurity: Not on file  Transportation Needs: Not on file  Physical Activity: Not on file  Stress: Not on file  Social Connections: Not on file  Intimate Partner Violence: Not on file    FAMILY HISTORY: Family History  Problem Relation Age of Onset   Heart disease Father     ALLERGIES:  is allergic to peanut allergen powder-dnfp, peanut-containing drug products, hydrochlorothiazide, other, pistachio nut (diagnostic), codeine, and fruit & vegetable daily [nutritional supplements].  MEDICATIONS:  Current Facility-Administered Medications  Medication Dose Route Frequency Provider Last Rate Last Admin   0.9 %  sodium chloride infusion   Intravenous Continuous MCherene Altes MD   Stopped at 08/19/21 [phone]  acetaminophen (TYLENOL) tablet 650 mg  650 mg Oral Q6H PRN PLenore Cordia MD       Or   acetaminophen (TYLENOL) suppository 650 mg  650 mg Rectal Q6H PRN PLenore Cordia MD       acetaminophen (TYLENOL) tablet 500 mg  500 mg Oral Q6H PRN TEugenie Filler MD       allopurinol (ZYLOPRIM) tablet 300 mg  300 mg Oral QPM TEugenie Filler MD   300 mg at 08/25/21 1600   amLODipine (NORVASC) tablet 5 mg  5 mg Oral Daily Swayze, Ava, DO       artificial tears (LACRILUBE) ophthalmic ointment   Both Eyes QHS MCherene Altes MD   Given at 08/25/21 2236   enoxaparin (LOVENOX) injection 30 mg  30 mg Subcutaneous Q24H PZada FindersR, MD   30 mg at 08/25/21 2236   ferrous sulfate tablet 325 mg  325 mg Oral QNeville Route MD   325 mg at 08/26/21 0854   finasteride (PROSCAR) tablet 5 mg   5 mg Oral Daily PZada FindersR, MD   5 mg at [id number]  folic acid (FOLVITE) tablet 3 mg  3 mg Oral q morning TEugenie Filler MD   3 mg at 08/26/21 0853   guaiFENesin (MUCINEX) 12 hr tablet 1,200 mg  1,200 mg Oral BID TEugenie Filler MD   1,200 mg at 08/26/21 0854   insulin aspart (novoLOG) injection 0-9 Units  0-9 Units Subcutaneous TID WC Patel, Vishal R,  MD   2 Units at 08/26/21 0852   ipratropium-albuterol (DUONEB) 0.5-2.5 (3) MG/3ML nebulizer solution 3 mL  3 mL Nebulization Q6H PRN Eugenie Filler, MD   3 mL at 08/25/21 6256   levothyroxine (SYNTHROID) tablet 50 mcg  50 mcg Oral Q0600 Lenore Cordia, MD   50 mcg at 08/26/21 0524   lidocaine (PF) (XYLOCAINE) 1 % injection    PRN Docia Barrier, PA   10 mL at 08/14/21 1135   loratadine (CLARITIN) tablet 10 mg  10 mg Oral q morning Eugenie Filler, MD   10 mg at 08/26/21 0854   metoprolol succinate (TOPROL-XL) 24 hr tablet 25 mg  25 mg Oral Daily Eugenie Filler, MD   25 mg at 08/26/21 0854   mometasone-formoterol (DULERA) 200-5 MCG/ACT inhaler 2 puff  2 puff Inhalation BID Eugenie Filler, MD   2 puff at 08/26/21 0805   ondansetron (ZOFRAN) tablet 4 mg  4 mg Oral Q6H PRN Lenore Cordia, MD       Or   ondansetron (ZOFRAN) injection 4 mg  4 mg Intravenous Q6H PRN Lenore Cordia, MD       pantoprazole (PROTONIX) EC tablet 40 mg  40 mg Oral Q0600 Eugenie Filler, MD   40 mg at 08/26/21 0524   rosuvastatin (CRESTOR) tablet 10 mg  10 mg Oral Q M,W,F Lenore Cordia, MD   10 mg at 08/26/21 0854   sertraline (ZOLOFT) tablet 50 mg  50 mg Oral q morning Lenore Cordia, MD   50 mg at 08/26/21 0901   sodium chloride (MURO 128) 5 % ophthalmic solution 1 drop  1 drop Both Eyes QID Eugenie Filler, MD   1 drop at 08/26/21 3893   sodium chloride flush (NS) 0.9 % injection 3 mL  3 mL Intravenous Q12H Lenore Cordia, MD   3 mL at 08/26/21 0856    REVIEW OF SYSTEMS:   Constitutional: ( - ) fevers, ( - )  chills  , ( - ) night sweats Eyes: ( - ) blurriness of vision, ( - ) double vision, ( - ) watery eyes Ears, nose, mouth, throat, and face: ( - ) mucositis, ( - ) sore throat Respiratory: ( - ) cough, ( - ) dyspnea, ( - ) wheezes Cardiovascular: ( - ) palpitation, ( - ) chest discomfort, ( - ) lower extremity swelling Gastrointestinal:  ( - ) nausea, ( - ) heartburn, ( - ) change in bowel habits Skin: ( - ) abnormal skin rashes Lymphatics: ( - ) new lymphadenopathy, ( - ) easy bruising Neurological: ( - ) numbness, ( - ) tingling, ( - ) new weaknesses Behavioral/Psych: ( - ) mood change, ( - ) new changes  All other systems were reviewed with the patient and are negative.  PHYSICAL EXAMINATION: ECOG PERFORMANCE STATUS: 3 - Symptomatic, >50% confined to bed  Vitals:   08/26/21 1508 08/26/21 1618  BP: 114/63 (!) 112/45  Pulse: 66   Resp: 18   Temp:    SpO2: 96%    Filed Weights   08/13/21 2200  Weight: 145 lb 1 oz (65.8 kg)    GENERAL: chronically ill appearing elderly male in NAD  SKIN: skin color, texture, turgor are normal, no rashes or significant lesions EYES: conjunctiva are pink and non-injected, sclera clear NECK: supple, non-tender LYMPH:  no palpable lymphadenopathy in the cervical, axillary or supraclavicular lymph nodes.  LUNGS: clear to auscultation and  percussion with normal breathing effort HEART: regular rate & rhythm and no murmurs and no lower extremity edema Musculoskeletal: no cyanosis of digits and no clubbing  PSYCH: alert & oriented x 3, fluent speech NEURO: no focal motor/sensory deficits  LABORATORY DATA:  I have reviewed the data as listed CBC Latest Ref Rng & Units 08/26/2021 08/23/2021 08/22/2021  WBC 4.0 - 10.5 K/uL 11.0(H) 11.4(H) 13.9(H)  Hemoglobin 13.0 - 17.0 g/dL 12.1(L) 11.5(L) 11.7(L)  Hematocrit 39.0 - 52.0 % 36.9(L) 35.6(L) 35.9(L)  Platelets 150 - 400 K/uL 277 272 267    CMP Latest Ref Rng & Units 08/26/2021 08/25/2021 08/24/2021  Glucose 70 - 99  mg/dL 151(H) 142(H) 148(H)  BUN 8 - 23 mg/dL 12 15 25(H)  Creatinine 0.61 - 1.24 mg/dL 1.25(H) 1.26(H) 1.54(H)  Sodium 135 - 145 mmol/L 138 141 140  Potassium 3.5 - 5.1 mmol/L 3.9 3.6 4.0  Chloride 98 - 111 mmol/L 117(H) 118(H) 115(H)  CO2 22 - 32 mmol/L 16(L) 18(L) 19(L)  Calcium 8.9 - 10.3 mg/dL 7.8(L) 7.6(L) 8.0(L)  Total Protein 6.5 - 8.1 g/dL 5.2(L) 4.9(L) 5.0(L)  Total Bilirubin 0.3 - 1.2 mg/dL 1.0 0.7 0.6  Alkaline Phos 38 - 126 U/L 74 76 90  AST 15 - 41 U/L 35 30 69(H)  ALT 0 - 44 U/L 29 27 31      PATHOLOGY: CYTOLOGY - NON PAP  CASE: [id number]  PATIENT: Seth Simpson  Non-Gynecological Cytology Report   Clinical History: Pleural Effusion   FINAL MICROSCOPIC DIAGNOSIS:   A. LYMPH NODE, 4R, FINE NEEDLE ASPIRATION:  -  Non-Hodgkin B-cell lymphoma  -  See comment   B. LYMPH NODE, 7, FINE NEEDLE ASPIRATION:  -  Non-Hodgkin B-cell lymphoma  -  See comment   C. LYMPH NODE, 11L, FINE NEEDLE ASPIRATION:  -  Non-Hodgkin B-cell lymphoma  -  See comment   COMMENT:   The samples are composed of atypical lymphocytes, some of which appear  large.  By immunohistochemistry, the atypical lymphocytes are positive  for CD20, Pax-5, Bcl-2 and Bcl-6 but negative for CD3, CD5, CD10, CD23,  CD34, TdT or cyclin-D1.  The proliferative rate by ki-67 is 40-50%.  Mum-1 is pending and will be reported in an addendum.  Flow cytometry  performed on the sample identified a kappa-restricted B-cell population  without expression of CD10 or CD5 (see [phone]).  Overall, the  findings are consistent with a non-Hodgkin B-cell lymphoma.   SPECIMEN ADEQUACY:  A. Satisfactory for Evaluation  B. Satisfactory for Evaluation  C. Satisfactory for Evaluation   IMMEDIATE EVALUATION:  CONCERNING FOR LYMPHOMA (DLB)   GROSS:  Received is/are (A) (1) 2 Slides (1 Quick Stain).  (2) 2 Slides (1 Quick  Stain).   (3) 2 Slides (1 Quick Stain).   Also received are 12cc's of  cloudy, red saline  solution from needle rinses.  (B) (1) 2 Slides (1  Quick Stain).  (2) 2 Slides (1 Quick Stain).  (3) 2 Slides (1 Quick  Stain).   (4) 2 Slides (1 Quick Stain).   Also received are 15cc's of  cloudy, red saline solution from needle rinses.   (C) (1) 2 Slides (1  Quick Stain).  (2) 2 Slides (1 Quick Stain). (3) 2 Slides (1 Quick  Stain). Also received are 12cc's of cloudy, red saline solution from  needle rinses.  Smears: (A) 6 (B) 8 (C) 6  Concentration Method (Thin Prep): (A) 1 (B) 1 (C) 1  Cell Block: (A) 1 Conventional (  B) 1 Conventional (C) 1 Conventional  Additional Studies: Material collected/sent for Flow Cytometry   Final Diagnosis performed by Thressa Sheller, MD.   Electronically signed  08/25/2021  Technical and / or Professional components performed at [employer]. [facility], [place] [address], [place], [place] [address].   Immunohistochemistry Technical component (if applicable) was performed  at [facility]. [address] [place],  [place],  [address].   IMMUNOHISTOCHEMISTRY DISCLAIMER (if applicable):  Some of these immunohistochemical stains may have been developed and the  performance characteristics determine by [facility]. Some  may not have been cleared or approved by the U.S. Food and Drug  Administration. The FDA has determined that such clearance or approval  is not necessary. This test is used for clinical purposes. It should not  be regarded as investigational or for research. This laboratory is  certified under the [place]  (CLIA-88) as qualified to perform high complexity clinical laboratory  testing.  The controls stained appropriately.    RADIOGRAPHIC STUDIES: I have personally reviewed the radiological images as listed and agreed with the findings in the report. DG Chest 1 View  Result Date: 08/14/2021 CLINICAL DATA:  Status post left thoracentesis EXAM: CHEST  1 VIEW  COMPARISON:  Chest radiograph 08/13/2021 FINDINGS: Median sternotomy wires and mediastinal surgical clips are stable. The cardiomediastinal silhouette is grossly stable. Perihilar opacities likely correspond to lymphadenopathy in the left perihilar mass seen on the prior CT. The left pleural effusion has significantly decreased in size following thoracentesis with improved aeration of the left lung. There is no appreciable pneumothorax. Streaky opacities in the right base likely reflect atelectasis. The right lung is otherwise clear. There is no right pleural effusion or pneumothorax. IMPRESSION: Decreased left pleural effusion with improved aeration of the left lung following thoracentesis. No pneumothorax. Electronically Signed   By: Valetta Mole M.D.   On: 08/14/2021 12:11   DG Chest 1 View  Result Date: 08/13/2021 CLINICAL DATA:  Shortness of breath. EXAM: CHEST  1 VIEW COMPARISON:  11/28/2018 FINDINGS: The cardio pericardial silhouette is enlarged. Interstitial markings are diffusely coarsened with chronic features. Right base collapse/consolidation noted with moderate right pleural effusion. Soft tissue fullness noted left suprahilar region. Patchy airspace disease noted right base. Bones are diffusely demineralized. Status post CABG. IMPRESSION: Left base collapse/consolidation with left suprahilar fullness and moderate left effusion. Given asymmetry of findings, underlying neoplasm a concern. Chest CT with contrast recommended to further evaluate. Electronically Signed   By: Misty Stanley M.D.   On: 08/13/2021 13:00   DG Chest 2 View  Result Date: 08/19/2021 CLINICAL DATA:  Pleural effusion EXAM: CHEST - 2 VIEW COMPARISON:  Chest radiograph 08/14/2021 FINDINGS: Median sternotomy wires and mediastinal surgical clips are again seen. The cardiomediastinal silhouette is grossly similar, though suboptimally evaluated on the current study. The left pleural effusion has increased in size with worsening  aeration of the left lung. Left perihilar opacities are not significantly changed. The right lung is spared, with no new focal airspace opacity. There is no right pleural effusion. There is no pneumothorax. Marked degenerative change of both shoulders is again seen. There is no acute osseous abnormality. IMPRESSION: Increased left pleural effusion with worsened opacity in the left lung. Electronically Signed   By: Valetta Mole M.D.   On: 08/19/2021 10:31   CT Chest W Contrast  Result Date: 08/13/2021 CLINICAL DATA:  Shortness of breath EXAM: CT CHEST WITH CONTRAST  TECHNIQUE: Multidetector CT imaging of the chest was performed during intravenous contrast administration. CONTRAST:  79m OMNIPAQUE IOHEXOL 350 MG/ML SOLN COMPARISON:  Chest radiograph dated 08/13/2021 FINDINGS: Cardiovascular: The heart is normal in size. No pericardial effusion. No evidence of thoracic aortic aneurysm. Atherosclerotic calcifications of the aortic arch. Coronary atherosclerosis of the LAD and right coronary artery. Postsurgical changes related to prior CABG. Mediastinum/Nodes: Thoracic lymphadenopathy, including: --16 mm short axis node at the right thoracic inlet (series 3/image 26) --19 mm short axis AP window node (series 3/image 59) --18 mm short axis right paratracheal node (series 3/image 62) --20 mm short axis right hilar node (series 3/image 79) --23 mm short axis subcarinal node (series 3/image 82) --22 mm short axis left perihilar node (series 3/image 87) Visualized thyroid is unremarkable. Lungs/Pleura: 4.0 x 2.4 cm mass in the medial left upper lobe/perihilar region (series 3/image 67). Additional tumor along the left mediastinum (series 3/image 56) and extending to the left hilum. Associated lingular and left lower lobe postobstructive opacity/atelectasis, underlying tumor not excluded. Moderate left pleural effusion. Some degree of pleural-based nodularity anteriorly (series 3/image 105) and along the posteromedial base  (series 3/image 129) suggests a malignant pleural effusion. Mild centrilobular and paraseptal emphysematous changes. Biapical pleural-parenchymal scarring. Mild subpleural reticulation/fibrosis in the right lower lobe. Trace right pleural fluid. No pneumothorax. Upper Abdomen: Visualized upper abdomen is notable for bilateral renal cysts and vascular calcifications. Musculoskeletal: Degenerative changes of the visualized thoracolumbar spine. Median sternotomy. IMPRESSION: 4.0 cm medial left upper lobe/perihilar mass, extending into the left hilum, compatible with primary bronchogenic neoplasm. Associated thoracic nodal metastases. Postobstructive opacity/atelectasis in the lingula and left lower lobe. Moderate left pleural effusion, favored to be malignant. Aortic Atherosclerosis (ICD10-I70.0) and Emphysema (ICD10-J43.9). Electronically Signed   By: SJulian HyM.D.   On: 08/13/2021 20:10   DG CHEST PORT 1 VIEW  Result Date: 08/26/2021 CLINICAL DATA:  Status post thoracentesis EXAM: PORTABLE CHEST 1 VIEW COMPARISON:  08/25/2021 FINDINGS: Single frontal view of the chest demonstrates an unremarkable cardiac silhouette. Postsurgical changes from prior CABG. Persistent but decreased left pleural effusion and residual left basilar atelectasis. The left hilar mass seen on prior CT is again identified consistent with bronchogenic malignancy. Interval improvement in aeration at the right lung base. No right-sided effusion or pneumothorax. No acute bony abnormalities. IMPRESSION: 1. Decreased left pleural effusion after thoracentesis. Small residual left effusion and left basilar atelectasis. 2. Persistent left hilar mass consistent with bronchogenic malignancy seen on prior CT. 3. Improved aeration at the right lung base. Electronically Signed   By: MRanda NgoM.D.   On: 08/26/2021 15:36   DG CHEST PORT 1 VIEW  Result Date: 08/25/2021 CLINICAL DATA:  Follow-up pleural effusion EXAM: PORTABLE CHEST 1 VIEW  COMPARISON:  08/19/2021 FINDINGS: Cardiac shadow is stable. Postsurgical changes are again seen. Increasing bibasilar infiltrates are noted left greater than right with associated left-sided effusion which has also increased in the interval. No acute bony abnormality is noted. IMPRESSION: Increasing bibasilar infiltrates with associated left-sided effusion of a moderate to large size. Electronically Signed   By: MInez CatalinaM.D.   On: 08/25/2021 13:01   DG CHEST PORT 1 VIEW  Result Date: 08/19/2021 CLINICAL DATA:  Status post thoracentesis EXAM: PORTABLE CHEST 1 VIEW COMPARISON:  08/19/2021 at 7:08 a.m. FINDINGS: 3:35 p.m. High riding humeral heads, consistent with chronic rotator cuff insufficiency. Midline trachea. Mild cardiomegaly. Prior median sternotomy. Atherosclerosis in the transverse aorta. Small left pleural effusion, decreased. No pneumothorax. Interstitial edema,  moderate. Improved left base and worsened right base airspace disease. IMPRESSION: No pneumothorax or other acute complication after thoracentesis. Decreased left pleural effusion with improved adjacent airspace disease, atelectasis versus infection. Underlying congestive heart failure with developing right base airspace disease, favoring atelectasis or alveolar edema. Aortic Atherosclerosis (ICD10-I70.0). Electronically Signed   By: Abigail Miyamoto M.D.   On: 08/19/2021 16:01   IR THORACENTESIS ASP PLEURAL SPACE W/IMG GUIDE  Result Date: 08/14/2021 INDICATION: Patient with shortness of breath, left pleural effusion. Request is made for diagnostic and therapeutic thoracentesis. EXAM: ULTRASOUND GUIDED LEFT DIAGNOSTIC AND THERAPEUTIC THORACENTESIS MEDICATIONS: 10 mL 1% lidocaine COMPLICATIONS: None immediate. PROCEDURE: An ultrasound guided thoracentesis was thoroughly discussed with the patient and questions answered. The benefits, risks, alternatives and complications were also discussed. The patient understands and wishes to proceed with  the procedure. Written consent was obtained. Ultrasound was performed to localize and mark an adequate pocket of fluid in the left chest. The area was then prepped and draped in the normal sterile fashion. 1% Lidocaine was used for local anesthesia. Under ultrasound guidance a 6 Fr Safe-T-Centesis catheter was introduced. Thoracentesis was performed. The catheter was removed and a dressing applied. FINDINGS: A total of approximately 1.3 liters of amber fluid was removed. Samples were sent to the laboratory as requested by the clinical team. IMPRESSION: Successful ultrasound guided diagnostic and therapeutic left thoracentesis yielding 1.3 liters of pleural fluid. Read by: Brynda Greathouse PA-C Electronically Signed   By: Jacqulynn Cadet M.D.   On: 08/14/2021 12:00    ASSESSMENT & PLAN Seth Simpson 87 y.o. male with medical history significant for CAD, GERD, gout, HLD, HTN, RA and DM Type II who presented with shortness of breath and was found to have DLBCL with pleural effusion and perihilar mass.   After review of the labs, review of the records, and discussion with the patient the patients findings are most consistent with a newly diagnosed non-Hodgkin B-cell lymphoma, most likely diffuse large B-cell lymphoma.  # Diffuse Large B Cell Lymphoma, Staging in Process --given the patient's advanced age and co-morbidities he would not be a good candidate for chemotherapy treatment --additionally in his deconditioned state he would not likely be able to tolerate chemotherapy. He would need considerable clinical improvement to consider treatment.  --can complete staging with a CT abdomen pelvis if patient is clinically stable enough for CT scan.  --labs to include LDH, uric acid, phosphorus added to tomorrows labs --recommend palliative care consult and goals of care discussions.  --Oncology will continue to follow.   All questions were answered. The patient knows to call the clinic with any problems,  questions or concerns.  A total of more than 50 minutes were spent on this encounter with face-to-face time and non-face-to-face time, including preparing to see the patient, ordering tests and/or medications, counseling the patient and coordination of care as outlined above.   Ledell Peoples, MD Department of Hematology/Oncology [facility] at [facility] Phone: [phone] Pager: [phone] Email: [email or web address]  08/26/2021 4:25 PM

## 2021-08-26 NOTE — Assessment & Plan Note (Signed)
Baseline creatinine appears to be 1.0 - 1.04. Creatinine upon presentation was 1.78. Now improved to 1.25. Will continue to monitor creatinine, electrolytes and volume status. Avoid nephrotoxins and hypotension.

## 2021-08-26 NOTE — Progress Notes (Signed)
Physical Therapy Treatment Patient Details Name: Seth Simpson MRN: [id number] DOB: October 05, 1931 Today's Date: 08/26/2021   History of Present Illness 87 y/o presented to ED on 9/1 for DOE. CT chest concerning of L upper lobe mass and moderate L sided pleural effusion concerning for malignancy. Pt s/p thoracentesis 9/2 and 9/7. EBUS on 9/9 with sampling of 4R, 7 and 11L lymph nodes showing non-hodgkin B cell lymphoma. PMH: CAD s/p CABG, paroxysmal SVT on amiodarone, frequent PVCs, LBBB, type 2 diabetes, HTN, HLD, hypothyroidism, depression, BPH    PT Comments    Pt received in bed with noted dyspnea and increased WOB. SpO2 94% on 3L. Pt declining OOB/mobility attempts reporting fatigue and difficulty breathing. Agreeable to exercises in bed. Pt able to perform minimal LE exercises. Multiple rest breaks between exercises/reps to recover breathing. Pt remained supine in bed at end of session.    Recommendations for follow up therapy are one component of a multi-disciplinary discharge planning process, led by the attending physician.  Recommendations may be updated based on patient status, additional functional criteria and insurance authorization.  Follow Up Recommendations  Home health PT;Supervision/Assistance - 24 hour     Equipment Recommendations  None recommended by PT    Recommendations for Other Services       Precautions / Restrictions Precautions Precautions: Fall;Other (comment) Precaution Comments: watch sats Restrictions Weight Bearing Restrictions: No     Mobility  Bed Mobility                    Transfers                    Ambulation/Gait                 Stairs             Wheelchair Mobility    Modified Rankin (Stroke Patients Only)       Balance                                            Cognition Arousal/Alertness: Awake/alert Behavior During Therapy: WFL for tasks  assessed/performed;Anxious Overall Cognitive Status: Within Functional Limits for tasks assessed                                 General Comments: anxious regarding breathing (dyspnea, increased WOB)      Exercises General Exercises - Lower Extremity Ankle Circles/Pumps: AROM;Both;15 reps Heel Slides: AROM;Right;Left;10 reps;Supine Hip ABduction/ADduction: AROM;Right;Left;10 reps;Supine    General Comments        Pertinent Vitals/Pain Pain Assessment: Faces Faces Pain Scale: Hurts a little bit Pain Location: chest Pain Descriptors / Indicators: Tightness Pain Intervention(s): Monitored during session    Home Living                      Prior Function            PT Goals (current goals can now be found in the care plan section) Acute Rehab PT Goals Patient Stated Goal: "get this fluid off my lungs" Progress towards PT goals: Not progressing toward goals - comment (increased dyspnea, increased WOB)    Frequency    Min 3X/week      PT Plan Current plan remains appropriate    Co-evaluation  AM-PAC PT "6 Clicks" Mobility   Outcome Measure  Help needed turning from your back to your side while in a flat bed without using bedrails?: A Little Help needed moving from lying on your back to sitting on the side of a flat bed without using bedrails?: A Little Help needed moving to and from a bed to a chair (including a wheelchair)?: A Little Help needed standing up from a chair using your arms (e.g., wheelchair or bedside chair)?: A Little Help needed to walk in hospital room?: A Little Help needed climbing 3-5 steps with a railing? : Total 6 Click Score: 16    End of Session Equipment Utilized During Treatment: Oxygen Activity Tolerance: Patient limited by fatigue;Other (comment) (dyspnea, increased WOB) Patient left: in bed;with call bell/phone within reach   PT Visit Diagnosis: Unsteadiness on feet (R26.81);Muscle weakness  (generalized) (M62.81);History of falling (Z91.81);Difficulty in walking, not elsewhere classified (R26.2)     Time: [id number] PT Time Calculation (min) (ACUTE ONLY): 13 min  Charges:  $Therapeutic Exercise: 8-22 mins                     Lorrin Goodell, PT  Office # [phone] Pager [phone]    Lorriane Shire 08/26/2021, 11:47 AM

## 2021-08-26 NOTE — Assessment & Plan Note (Signed)
Controlled on metformin at home. Hemoglobin A1c was 7.2. Inpatient his glucoses are being managed with FSBS and SSI. Glucoses have been 100-176 in the last 24 hours.

## 2021-08-26 NOTE — Assessment & Plan Note (Signed)
Noted and stable. Patient is s/p CABG in 2009. The patient is continued on metoprolol and crestor. Monitor on telemetry.

## 2021-08-26 NOTE — Assessment & Plan Note (Signed)
Biopsy of lymph nodes was positive for NHL.

## 2021-08-26 NOTE — Assessment & Plan Note (Signed)
Continue metoprolol and to monitor on telemetry.

## 2021-08-26 NOTE — Assessment & Plan Note (Signed)
Due to multiple factors. Left upper lobe mass, pleural effusion. Pulmonology has been consulted, and the plan is for the patient to go for thoracentesis tomorrow. The patient has had thoracentesis by IR that was non-diagnostic and FNA of lymph nodes that was positive for Non-hodgkins lymphoma. . He has also undergone bronchoscopy with biopsy that is still pending. Repeat thoracentesis is planned for tomorrow for therapeutic relief.

## 2021-08-26 NOTE — Assessment & Plan Note (Signed)
Blood pressures are trending a little high. Will add amlodipine 5 mg daily. Monitor.

## 2021-08-26 NOTE — Assessment & Plan Note (Signed)
For therapeutic thoracentesis tomorrow. 1.3 L removed upon admission. Non-diagnostic for malignancy.

## 2021-08-26 NOTE — Progress Notes (Signed)
PROGRESS NOTE  COLON DELAOSSA [id number] DOB: 03-26-1931 DOA: 08/13/2021 PCP: Josetta Huddle, MD  Brief History   87 year old with a history of CAD status post CABG, paroxysmal SVT on amiodarone, LBBB, DM2, HTN, HLD, hypothyroidism, depression, and BPH who presented to the ED with dyspnea.  CT chest in the ED revealed a possible left upper lobe lung mass and a moderate left-sided pleural effusion.  Patient was admitted to the acute units to undergo diagnostic and therapeutic thoracentesis. Thoracentesis removed 1.3 L amber fluid that was non-diagnostic. However, the patient underwent biopsy of lymph nodes that was positive for non-hodgkins lymphoma. Fluid has been re-accumulating and the patient will undergo therapeutic thoracentesis with pulmonary critical care tomorrow.  Consultants  PCCM Interventional radiology  Procedures  Thoracentesis x 2 FNA lymph nodes  Antibiotics  None  Subjective  The patient states that he is feeling more short of breath today.  Objective   Vitals:  Vitals:   08/26/21 1211 08/26/21 1424  BP: 138/84 (!) 153/73  Pulse:  72  Resp:  20  Temp:    SpO2:  95%    Exam:  Constitutional:  Appears calm and comfortable Eyes:  pupils and irises appear normal Normal lids and conjunctivae ENMT:  grossly normal hearing  Lips appear normal external ears, nose appear normal Oropharynx: mucosa, tongue,posterior pharynx appear normal Neck:  neck appears normal, no masses, normal ROM, supple no thyromegaly Respiratory:  CTA bilaterally, no w/r/r.  Respiratory effort normal. No retractions or accessory muscle use Cardiovascular:  RRR, no m/r/g No LE extremity edema   Normal pedal pulses Abdomen:  Abdomen appears normal; no tenderness or masses No hernias No HSM Musculoskeletal:  Digits/nails BUE: no clubbing, cyanosis, petechiae, infection exam of joints, bones, muscles of at least one of following: head/neck, RUE, LUE, RLE, LLE   strength and  tone normal, no atrophy, no abnormal movements No tenderness, masses Normal ROM, no contractures  gait and station Skin:  No rashes, lesions, ulcers palpation of skin: no induration or nodules Neurologic:  CN 2-12 intact Sensation all 4 extremities intact Psychiatric:  Mental status Mood, affect appropriate Orientation to person, place, time  judgment and insight appear intact     I have personally reviewed the following:   Today's Data  CBC    Component Value Date/Time   WBC 11.0 (H) 08/26/2021 0139   RBC 3.43 (L) 08/26/2021 0139   HGB 12.1 (L) 08/26/2021 0139   HCT 36.9 (L) 08/26/2021 0139   PLT 277 08/26/2021 0139   MCV 107.6 (H) 08/26/2021 0139   MCH 35.3 (H) 08/26/2021 0139   MCHC 32.8 08/26/2021 0139   RDW 15.1 08/26/2021 0139   LYMPHSABS 0.8 08/26/2021 0139   MONOABS 1.0 08/26/2021 0139   EOSABS 0.3 08/26/2021 0139   BASOSABS 0.1 08/26/2021 0139   BMP Latest Ref Rng & Units 08/26/2021 08/25/2021 08/24/2021  Glucose 70 - 99 mg/dL 151(H) 142(H) 148(H)  BUN 8 - 23 mg/dL 12 15 25(H)  Creatinine 0.61 - 1.24 mg/dL 1.25(H) 1.26(H) 1.54(H)  Sodium 135 - 145 mmol/L 138 141 140  Potassium 3.5 - 5.1 mmol/L 3.9 3.6 4.0  Chloride 98 - 111 mmol/L 117(H) 118(H) 115(H)  CO2 22 - 32 mmol/L 16(L) 18(L) 19(L)  Calcium 8.9 - 10.3 mg/dL 7.8(L) 7.6(L) 8.0(L)   Micro Data  Urine culture: No growth  Imaging  CT Chest with contrast 08/13/2021  Cardiology Data  EKG 08/14/2021  Other Data  FNA, Lymph node 08/14/2021: Non-Hodgkins lymphoma  Scheduled Meds:  allopurinol  300 mg Oral QPM   amLODipine  5 mg Oral Daily   artificial tears   Both Eyes QHS   enoxaparin (LOVENOX) injection  30 mg Subcutaneous Q24H   ferrous sulfate  325 mg Oral QODAY   finasteride  5 mg Oral Daily   folic acid  3 mg Oral q morning   guaiFENesin  1,200 mg Oral BID   insulin aspart  0-9 Units Subcutaneous TID WC   levothyroxine  50 mcg Oral Q0600   loratadine  10 mg Oral q morning   metoprolol  succinate  25 mg Oral Daily   mometasone-formoterol  2 puff Inhalation BID   pantoprazole  40 mg Oral Q0600   rosuvastatin  10 mg Oral Q M,W,F   sertraline  50 mg Oral q morning   sodium chloride  1 drop Both Eyes QID   sodium chloride flush  3 mL Intravenous Q12H   Continuous Infusions:  sodium chloride Stopped (08/19/21 0943)    Principal Problem:   Acute respiratory failure with hypoxia (HCC) Active Problems:   Coronary artery disease   Hypertension   Hypercholesteremia   Rheumatoid arthritis (HCC)   PSVT (paroxysmal supraventricular tachycardia) (HCC)   DM II (diabetes mellitus, type II), controlled ([place])   Mass of upper lobe of left lung   Pleural effusion   AKI (acute kidney injury) ([place])   Electrolyte abnormality   Hypothyroidism (acquired)   LOS: 12 days   A & P  Acute respiratory failure with hypoxia (HCC) Due to multiple factors. Left upper lobe mass, pleural effusion. Pulmonology has been consulted, and the plan is for the patient to go for thoracentesis tomorrow. The patient has had thoracentesis by IR that was non-diagnostic and FNA of lymph nodes that was positive for Non-hodgkins lymphoma. . He has also undergone bronchoscopy with biopsy that is still pending. Repeat thoracentesis is planned for tomorrow for therapeutic relief.  AKI (acute kidney injury) ([place]) Baseline creatinine appears to be 1.0 - 1.04. Creatinine upon presentation was 1.78. Now improved to 1.25. Will continue to monitor creatinine, electrolytes and volume status. Avoid nephrotoxins and hypotension.  Coronary artery disease Noted and stable. Patient is s/p CABG in 2009. The patient is continued on metoprolol and crestor. Monitor on telemetry.  DM II (diabetes mellitus, type II), controlled ([place]) Controlled on metformin at home. Hemoglobin A1c was 7.2. Inpatient his glucoses are being managed with FSBS and SSI. Glucoses have been 100-176 in the last 24 hours.  Electrolyte abnormality Patient  with hypercalcemia and hypomagnesemia upon admission. Hypercalcemia due to malignance. Now normalized when corrected for albumin. Hypomagnesia has been made replete. Monitor and address as necessary. Monitor on telemetry.  Hypercholesteremia Noted. Crestor continued.  Hypertension Blood pressures are trending a little high. Will add amlodipine 5 mg daily. Monitor.  Hypothyroidism (acquired) Continue synthroid as at home.  Mass of upper lobe of left lung Biopsy of lymph nodes was positive for NHL.   Pleural effusion For therapeutic thoracentesis tomorrow. 1.3 L removed upon admission. Non-diagnostic for malignancy.  PSVT (paroxysmal supraventricular tachycardia) (HCC) Continue metoprolol and to monitor on telemetry.  Rheumatoid arthritis ([place]) Noted.    This patient was seen and examined by myself. I have spent 34 minutes in his evaluation and care. Inpatient status  DVT prophylaxis: Lovenox Code Status: DNR Family Communication: None available Disposition Plan: To be determined.    Shloima Clinch, DO [facility] Direct contact: see [email or web address]  7PM-7AM contact night coverage as above 08/26/2021, 2:40  PM  LOS: 12 days

## 2021-08-26 NOTE — Progress Notes (Signed)
NAME:  Seth Simpson, MRN:  [id number], DOB:  10/31/1931, LOS: 12 ADMISSION DATE:  08/13/2021, CONSULTATION DATE:  08/14/21 REFERRING MD:  Irine Seal, MD CHIEF COMPLAINT:  Lung Mass, Pleural Effusion   History of Present Illness:  Seth Simpson is an 87 year old male with CAD s/p CABG, DMII, hypertension, and hyperlipidemia who presented to the ER on 08/13/21 with shortness of breath.   The dyspnea was progressive over the past 2 weeks. He has a 10lbs weight loss over recent months and his appetite has decreased. Initial chest radiograph showed left pleural effusion and left hilar fullness. CT chest with contrast was then performed which showed 4cm left upper lobe/perihilar mass extending into the left hilum. Moderate to large left pleural effusion. He underwent thoracentesis by IR 9/2 with removal of 1.3L of amber fluid. He had a second thoracentesis on 9/7 with 1.4L removed. Both sets of pleural fluid were negative for malignant cells. He then under went EBUS on 9/9 with sampling of 4R, 7 and 11L lymph nodes showing non-hodgkin B cell lymphoma.   He is a former smoker, he quit at age 29 and has a 12 pack year smoking history. 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 He denies any harmful dust or chemical exposures. Denies any asbestos exposures.  Pertinent  Medical History  Coronary Artery Disease GERD Hypertension Hyperlipidemia Rheumatoid arthritis Diabetes Mellitus Type II  Significant Hospital Events: Including procedures, antibiotic start and stop dates in addition to other pertinent events   Admitted 9/1 for dyspnea, CT chest notable for left hilar mass and left pleural effusion 9/2 Thoracentesis with IR 9/7 Thoracentesis with pulmonary 9/8 EBUS with pulmonary  Interim History / Subjective:   Patient is having increased work of breathing. CXR 9/13 showing increased left pleural effusion and right  lower lobe infiltrates.   I discussed the pathology/cytology results with the patient. I left a VM for his daughter Shauna Hugh so I have not discussed the diagnosis with her yet.  Objective   Blood pressure (!) 149/86, pulse 69, temperature 98.3 F (36.8 C), temperature source Oral, resp. rate 20, weight 65.8 kg, SpO2 95 %.        Intake/Output Summary (Last 24 hours) at 08/26/2021 0919 Last data filed at 08/26/2021 [id number] Gross per 24 hour  Intake 240 ml  Output 1650 ml  Net -1410 ml   Filed Weights   08/13/21 2200  Weight: 65.8 kg    Examination: General: Elderly male, thin, sitting up eating breakfast HENT: [place]/AT Lungs: Diminished breath sounds, > on left. No wheezing or rhonchi. Accessory muscle use. Cardiovascular: rrr, systolic murmur Abdomen: soft, non-tender, non-distended, bowel sounds present Extremities: warm, no edema Neuro: alert and awake.  GU: deferred  Resolved Hospital Problem list     Assessment & Plan:  Acute Hypoxemic Respiratory Failure Non-hodgkin B Cell Lymphoma leading to mediastinal and hilar lymphadenopathy with left pleural effusion Emphysema  - I have called Dr. Burr Medico of Oncology this morning for consult of his new diagnosis - Will hold off on pleurX placement as pleural effusion may improve rapidly with initiation of treatment for lymphoma.  - Will consider thoracentesis today if needed for symptom relief of his dyspnea and work of breathing. - Continue supplemental oxygen as needed to maintain SpO2 90-92% - Continue inhaler therapy and PRN nebulizers  Labs   CBC: Recent Labs  Lab 08/21/21 0045  08/22/21 0128 08/23/21 0357 08/26/21 0139  WBC 12.1* 13.9* 11.4* 11.0*  NEUTROABS  --   --   --  8.6*  HGB 12.0* 11.7* 11.5* 12.1*  HCT 36.8* 35.9* 35.6* 36.9*  MCV 106.7* 107.8* 109.2* 107.6*  PLT 229 267 272 [id number]    Basic Metabolic Panel: Recent Labs  Lab 08/22/21 0128 08/23/21 0357 08/24/21 0227 08/25/21 0521 08/26/21 0139  NA 139 137  140 141 138  K 4.8 4.2 4.0 3.6 3.9  CL 109 111 115* 118* 117*  CO2 19* 17* 19* 18* 16*  GLUCOSE 166* 156* 148* 142* 151*  BUN 25* 37* 25* 15 12  CREATININE 1.33* 1.84* 1.54* 1.26* 1.25*  CALCIUM 8.5* 8.2* 8.0* 7.6* 7.8*   GFR: Estimated Creatinine Clearance: 38 mL/min (A) (by C-G formula based on SCr of 1.25 mg/dL (H)). Recent Labs  Lab 08/21/21 0045 08/22/21 0128 08/23/21 0357 08/26/21 0139  WBC 12.1* 13.9* 11.4* 11.0*    Liver Function Tests: Recent Labs  Lab 08/22/21 0128 08/23/21 0357 08/24/21 0227 08/25/21 0521 08/26/21 0139  AST 56* 41 69* 30 35  ALT 38 37 '31 27 29  '$ ALKPHOS 78 79 90 76 74  BILITOT 0.8 0.5 0.6 0.7 1.0  PROT 4.2* 5.0* 5.0* 4.9* 5.2*  ALBUMIN 1.9* 2.0* 2.0* 2.3* 2.4*   No results for input(s): LIPASE, AMYLASE in the last 168 hours. No results for input(s): AMMONIA in the last 168 hours.  ABG    Component Value Date/Time   PHART 7.380 04/11/2008 2044   PCO2ART 37.0 04/11/2008 2044   PO2ART 122.0 (H) 04/11/2008 2044   HCO3 21.8 04/11/2008 2044   TCO2 22 07/26/2017 0715   ACIDBASEDEF 3.0 (H) 04/11/2008 2044   O2SAT 99.0 04/11/2008 2044     Coagulation Profile: No results for input(s): INR, PROTIME in the last 168 hours.  Cardiac Enzymes: No results for input(s): CKTOTAL, CKMB, CKMBINDEX, TROPONINI in the last 168 hours.  HbA1C: Hgb A1c MFr Bld  Date/Time Value Ref Range Status  08/14/2021 03:22 AM 7.2 (H) 4.8 - 5.6 % Final    Comment:    (NOTE) Pre diabetes:          5.7%-6.4%  Diabetes:              >6.4%  Glycemic control for   <7.0% adults with diabetes   04/11/2008 03:43 AM   Final   5.7 (NOTE)   The ADA recommends the following therapeutic goals for glycemic   control related to Hgb A1C measurement:   Goal of Therapy:   < 7.0% Hgb A1C   Action Suggested:  > 8.0% Hgb A1C   Ref:  Diabetes Care, 22, Suppl. 1, 1999    CBG: Recent Labs  Lab 08/25/21 0823 08/25/21 1337 08/25/21 1533 08/25/21 1947 08/26/21 0749  GLUCAP  165* 176* 176* 100* 159*    Critical care time: n/a    Freda Jackson, MD [facility] Office: [phone]   See Amion for personal pager PCCM on call pager [phone] until 7pm. Please call Elink 7p-7a. [phone]

## 2021-08-26 NOTE — Assessment & Plan Note (Signed)
Noted  

## 2021-08-26 NOTE — Progress Notes (Signed)
Lab called with a Lactic of 2.0  MD made aware.

## 2021-08-26 NOTE — Procedures (Signed)
Thoracentesis  Procedure Note  Seth Simpson  [id number]  Apr 22, 1931  Date:08/26/21  Time:3:15 PM   Provider Performing:Tyarra Nolton B Kately Graffam   Procedure: Thoracentesis with imaging guidance [id number])  Indication(s) Pleural Effusion  Consent Risks of the procedure as well as the alternatives and risks of each were explained to the patient and/or caregiver.  Consent for the procedure was obtained and is signed in the bedside chart  Anesthesia Topical only with 1% lidocaine    Time Out Verified patient identification, verified procedure, site/side was marked, verified correct patient position, special equipment/implants available, medications/allergies/relevant history reviewed, required imaging and test results available.   Sterile Technique Maximal sterile technique including full sterile barrier drape, hand hygiene, sterile gown, sterile gloves, mask, hair covering, sterile ultrasound probe cover (if used).  Procedure Description Ultrasound was used to identify appropriate pleural anatomy for placement and overlying skin marked.  Area of drainage cleaned and draped in sterile fashion. Lidocaine was used to anesthetize the skin and subcutaneous tissue.  1500 cc's of yellow, cloudy appearing fluid was drained from the left pleural space. Catheter then removed and bandaid applied to site.   Complications/Tolerance None; patient tolerated the procedure well. Chest X-ray is ordered to confirm no post-procedural complication.   EBL Minimal   Specimen(s) None sent to the lab

## 2021-08-26 NOTE — Assessment & Plan Note (Signed)
Continue synthroid as at home. °

## 2021-08-27 ENCOUNTER — Inpatient Hospital Stay (HOSPITAL_COMMUNITY): Payer: Medicare Other

## 2021-08-27 DIAGNOSIS — C8332 Diffuse large B-cell lymphoma, intrathoracic lymph nodes: Secondary | ICD-10-CM

## 2021-08-27 DIAGNOSIS — Z7189 Other specified counseling: Secondary | ICD-10-CM

## 2021-08-27 DIAGNOSIS — Z515 Encounter for palliative care: Secondary | ICD-10-CM

## 2021-08-27 DIAGNOSIS — J9601 Acute respiratory failure with hypoxia: Secondary | ICD-10-CM | POA: Diagnosis not present

## 2021-08-27 LAB — CBC WITH DIFFERENTIAL/PLATELET
Abs Immature Granulocytes: 0.19 10*3/uL — ABNORMAL HIGH (ref 0.00–0.07)
Basophils Absolute: 0 10*3/uL (ref 0.0–0.1)
Basophils Relative: 0 %
Eosinophils Absolute: 0.3 10*3/uL (ref 0.0–0.5)
Eosinophils Relative: 3 %
HCT: 36 % — ABNORMAL LOW (ref 39.0–52.0)
Hemoglobin: 11.7 g/dL — ABNORMAL LOW (ref 13.0–17.0)
Immature Granulocytes: 2 %
Lymphocytes Relative: 7 %
Lymphs Abs: 0.7 10*3/uL (ref 0.7–4.0)
MCH: 34.9 pg — ABNORMAL HIGH (ref 26.0–34.0)
MCHC: 32.5 g/dL (ref 30.0–36.0)
MCV: 107.5 fL — ABNORMAL HIGH (ref 80.0–100.0)
Monocytes Absolute: 0.9 10*3/uL (ref 0.1–1.0)
Monocytes Relative: 9 %
Neutro Abs: 8.2 10*3/uL — ABNORMAL HIGH (ref 1.7–7.7)
Neutrophils Relative %: 79 %
Platelets: 256 10*3/uL (ref 150–400)
RBC: 3.35 MIL/uL — ABNORMAL LOW (ref 4.22–5.81)
RDW: 15.1 % (ref 11.5–15.5)
WBC: 10.3 10*3/uL (ref 4.0–10.5)
nRBC: 0 % (ref 0.0–0.2)

## 2021-08-27 LAB — BASIC METABOLIC PANEL
Anion gap: 6 (ref 5–15)
BUN: 14 mg/dL (ref 8–23)
CO2: 16 mmol/L — ABNORMAL LOW (ref 22–32)
Calcium: 7.5 mg/dL — ABNORMAL LOW (ref 8.9–10.3)
Chloride: 119 mmol/L — ABNORMAL HIGH (ref 98–111)
Creatinine, Ser: 1.22 mg/dL (ref 0.61–1.24)
GFR, Estimated: 57 mL/min — ABNORMAL LOW (ref >60)
Glucose, Bld: 122 mg/dL — ABNORMAL HIGH (ref 70–99)
Potassium: 3.8 mmol/L (ref 3.5–5.1)
Sodium: 141 mmol/L (ref 135–145)

## 2021-08-27 LAB — LACTIC ACID, PLASMA
Lactic Acid, Venous: 1.7 mmol/L (ref 0.5–1.9)
Lactic Acid, Venous: 2.1 mmol/L (ref 0.5–1.9)

## 2021-08-27 LAB — URIC ACID: Uric Acid, Serum: 2.9 mg/dL — ABNORMAL LOW (ref 3.7–8.6)

## 2021-08-27 LAB — GLUCOSE, CAPILLARY
Glucose-Capillary: 125 mg/dL — ABNORMAL HIGH (ref 70–99)
Glucose-Capillary: 148 mg/dL — ABNORMAL HIGH (ref 70–99)

## 2021-08-27 LAB — LACTATE DEHYDROGENASE: LDH: 336 U/L — ABNORMAL HIGH (ref 98–192)

## 2021-08-27 LAB — PHOSPHORUS: Phosphorus: 1.2 mg/dL — ABNORMAL LOW (ref 2.5–4.6)

## 2021-08-27 LAB — CYTOLOGY - NON PAP

## 2021-08-27 MED ORDER — MORPHINE SULFATE (CONCENTRATE) 10 MG/0.5ML PO SOLN
5.0000 mg | ORAL | Status: DC | PRN
  Administered 2021-08-27 – 2021-08-28 (×2): 5 mg via ORAL
  Filled 2021-08-27 (×2): qty 0.5

## 2021-08-27 MED ORDER — DIPHENHYDRAMINE HCL 50 MG/ML IJ SOLN: 12.5000 mg | Freq: Four times a day (QID) | INTRAMUSCULAR | Status: DC | PRN

## 2021-08-27 MED ORDER — SODIUM CHLORIDE 0.9 % IV BOLUS
500.0000 mL | Freq: Once | INTRAVENOUS | Status: AC
  Administered 2021-08-27: 500 mL via INTRAVENOUS

## 2021-08-27 MED ORDER — MORPHINE (PF) INJECTION FOR INHALATION 10 MG/ML
5.0000 mg | RESPIRATORY_TRACT | Status: DC | PRN
  Administered 2021-08-27: 5 mg via RESPIRATORY_TRACT
  Filled 2021-08-27: qty 1

## 2021-08-27 MED ORDER — SENNA 8.6 MG PO TABS
1.0000 | ORAL_TABLET | Freq: Every day | ORAL | Status: DC
  Administered 2021-08-27 – 2021-08-29 (×3): 8.6 mg via ORAL
  Filled 2021-08-27 (×3): qty 1

## 2021-08-27 MED ORDER — ENOXAPARIN SODIUM 40 MG/0.4ML IJ SOSY: 40.0000 mg | PREFILLED_SYRINGE | INTRAMUSCULAR | Status: DC

## 2021-08-27 MED ORDER — METHOTREXATE 2.5 MG PO TABS
12.5000 mg | ORAL_TABLET | ORAL | Status: DC
  Administered 2021-08-28: 12.5 mg via ORAL
  Filled 2021-08-27: qty 5

## 2021-08-27 MED ORDER — SODIUM PHOSPHATES 45 MMOLE/15ML IV SOLN
30.0000 mmol | Freq: Once | INTRAVENOUS | Status: AC
  Administered 2021-08-27: 30 mmol via INTRAVENOUS
  Filled 2021-08-27: qty 10

## 2021-08-27 MED ORDER — IOHEXOL 350 MG/ML SOLN
80.0000 mL | Freq: Once | INTRAVENOUS | Status: AC | PRN
  Administered 2021-08-27: 80 mL via INTRAVENOUS

## 2021-08-27 MED ORDER — IOHEXOL 9 MG/ML PO SOLN
ORAL | Status: AC
  Administered 2021-08-27: 500 mL
  Filled 2021-08-27: qty 1000

## 2021-08-27 MED ORDER — LORAZEPAM 1 MG PO TABS
1.0000 mg | ORAL_TABLET | ORAL | Status: DC | PRN
  Administered 2021-08-27: 1 mg via ORAL
  Filled 2021-08-27: qty 1

## 2021-08-27 NOTE — Progress Notes (Signed)
PCCM Update: Plans for transition to hospice care as treatment for his lymphoma is not recommended by Oncology given his age and deconditioning. Discussed with family about placing pleurX catheter for palliative purposes. Procedure request has been placed for 9/16.  Freda Jackson, MD [facility] Office: [phone]   See Amion for personal pager PCCM on call pager [phone] until 7pm. Please call Elink 7p-7a. [phone]

## 2021-08-27 NOTE — Progress Notes (Signed)
Occupational Therapy Treatment Patient Details Name: Seth Simpson MRN: [id number] DOB: Jan 31, 1931 Today's Date: 08/27/2021   History of present illness 87 y/o presented to ED on 9/1 for DOE. CT chest concerning of L upper lobe mass and moderate L sided pleural effusion concerning for malignancy. Pt s/p thoracentesis 9/2 and 9/7. EBUS on 9/9 with sampling of 4R, 7 and 11L lymph nodes showing non-hodgkin B cell lymphoma. PMH: CAD s/p CABG, paroxysmal SVT on amiodarone, frequent PVCs, LBBB, type 2 diabetes, HTN, HLD, hypothyroidism, depression, BPH   OT comments  Patient was in bed when therapist entered room and asking to go to bathroom.  Patient had soiled bed and transferred to [facility] next to bed.  Patient stood for toilet hygiene and required a seated rest break before returning to bed.  Patient to continue to be followed by Acute OT.    Recommendations for follow up therapy are one component of a multi-disciplinary discharge planning process, led by the attending physician.  Recommendations may be updated based on patient status, additional functional criteria and insurance authorization.    Follow Up Recommendations  Home health OT;Supervision/Assistance - 24 hour    Equipment Recommendations  Wheelchair (measurements OT);Wheelchair cushion (measurements OT)    Recommendations for Other Services      Precautions / Restrictions Precautions Precautions: Fall;Other (comment) Precaution Comments: watch sats       Mobility Bed Mobility Overal bed mobility: Needs Assistance Bed Mobility: Supine to Sit;Sit to Supine     Supine to sit: Min guard Sit to supine: Min assist   General bed mobility comments: vcs and assistance with lines to get to eob    Transfers Overall transfer level: Needs assistance Equipment used: Rolling walker (2 wheeled) Transfers: Sit to/from [facility] Sit to Stand: Min assist Stand pivot transfers: Min assist       General transfer  comment: performed transfer to [place] Overall balance assessment: Needs assistance Sitting-balance support: No upper extremity supported;Feet supported Sitting balance-Leahy Scale: Fair Sitting balance - Comments: min guard to supervision for sitting balance   Standing balance support: Bilateral upper extremity supported;During functional activity Standing balance-Leahy Scale: Poor Standing balance comment: reliant on UE support and external assist                           ADL either performed or assessed with clinical judgement   ADL Overall ADL's : Needs assistance/impaired                         Toilet Transfer: Minimal assistance;BSC;RW;Stand-pivot Toilet Transfer Details (indicate cue type and reason): Patient transferred to [facility] next to bed with RW Toileting- Clothing Manipulation and Hygiene: Maximal assistance;Sit to/from stand Toileting - Clothing Manipulation Details (indicate cue type and reason): max assist for toilet hygiene while standing     Functional mobility during ADLs: Minimal assistance;Rolling walker General ADL Comments: performed transfer to [facility] from [place]: Awake/alert Behavior During Therapy: [facility] for tasks assessed/performed;Anxious Overall Cognitive Status: Within Functional Limits for tasks assessed Area of Impairment: Safety/judgement;Orientation                 Orientation Level: Disoriented to;Time       Safety/Judgement: Decreased awareness of deficits     General Comments: concerned over  using the bathroom        Exercises     Shoulder Instructions       General Comments      Pertinent Vitals/ Pain       Pain Assessment: No/denies pain  Home Living                                          Prior Functioning/Environment              Frequency  Min 2X/week        Progress Toward  Goals  OT Goals(current goals can now be found in the care plan section)  Progress towards OT goals: Progressing toward goals  Acute Rehab OT Goals Patient Stated Goal: "get this fluid off my lungs" OT Goal Formulation: With patient Time For Goal Achievement: 08/31/21 Potential to Achieve Goals: Fair ADL Goals Pt Will Perform Grooming: with set-up;sitting;standing Pt Will Perform Lower Body Bathing: with set-up;sit to/from stand Pt Will Perform Lower Body Dressing: with set-up;sit to/from stand Pt Will Transfer to Toilet: with supervision;ambulating;regular height toilet Pt Will Perform Toileting - Clothing Manipulation and hygiene: with supervision;sit to/from stand Pt/caregiver will Perform Home Exercise Program: Increased strength;Both right and left upper extremity;With theraband;With written HEP provided;With Supervision  Plan Discharge plan remains appropriate    Co-evaluation                 AM-PAC OT "6 Clicks" Daily Activity     Outcome Measure   Help from another person eating meals?: None Help from another person taking care of personal grooming?: A Little Help from another person toileting, which includes using toliet, bedpan, or urinal?: A Lot Help from another person bathing (including washing, rinsing, drying)?: A Lot Help from another person to put on and taking off regular upper body clothing?: A Little Help from another person to put on and taking off regular lower body clothing?: A Lot 6 Click Score: 16    End of Session Equipment Utilized During Treatment: Rolling walker;Oxygen  OT Visit Diagnosis: Unsteadiness on feet (R26.81);Muscle weakness (generalized) (M62.81)   Activity Tolerance Patient tolerated treatment well   Patient Left in bed;with call bell/phone within reach;with nursing/sitter in room   Nurse Communication Mobility status        Time: [id number] OT Time Calculation (min): 23 min  Charges: OT General Charges $OT Visit: 1  Visit OT Treatments $Self Care/Home Management : 23-37 mins  Lodema Hong, OTA   Trixie Dredge 08/27/2021, 3:31 PM

## 2021-08-27 NOTE — Consult Note (Addendum)
Consultation Note Date: 08/27/2021   Patient Name: Seth Simpson  DOB: Jul 18, 1931  MRN: [id number]  Age / Sex: 87 y.o., male  PCP: Seth Huddle, MD Referring Physician: Karie Kirks, DO  Reason for Consultation: GOC and symptom management  HPI/Patient Profile: 87 y.o. male  with past medical history of CAD s/p CABG, paroxysmal SVT, HTN, DM2, HTN, HLD, hypothyroid, depression, BPH admitted on 08/13/2021 with dyspnea on exertion. Thorough workup has revealed lung mass with reaccumuluating pleural effusions. Biopsy of lymph nodes indicates diffuse large B-Cell lymphoma. Oncology consulted and he is not a candidate for treatment. Palliative consulted for GOC and symptom management.   Primary Decision Maker PATIENT and next of kin- daughter- Seth Simpson  Discussion: Chart reviewed. Met at bedside with patient and his daughter and son-in-law.  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  He also was in [employer] and served in [place].  He graduated from [employer].  He had 2 children, 1 daughter died at age 8 from [place], his surviving daughter is Seth Simpson.  He lives at home by himself, however Seth Simpson lives very close by and assists in his care. His health has had significant declines over the last year and especially in the last month.  His ability to function has decreased and his appetite has decreased. We discussed his current acute illness.  He and his family are aware that this cancer will eventually lead him to end-of-life. We discussed what his goals would be for how he would want to spend his time with what time he does have left and his end-of-life wishes. He wants to be at home.  He wants to be able to read.  He wants to not be in pain or short of breath and not suffer. We discussed the option of having hospice services at home.  He and his family are familiar with hospice  care and would like to have hospice support at home.  Seth Simpson has also been in touch with [facility] and is arranging to ensure that he has 24-hour care at home. Symptom management was discussed.  We reviewed his current meds and discontinued medications that were not necessary to maintain his comfort or quality of life.  Pleurx catheter has been presented as an option for symptom management.  He is interested in having this placed. He is having some difficulty swallowing at times coughing when taking sips of water.     SUMMARY OF RECOMMENDATIONS -Plan for symptom management and to discharge home with Hospice -Medications reviewed- some dc'd to decrease pill burden, he requests to restart his methotrexate for his arthritis- Seth Simpson contacted and she kindly agreed to restart- he and family wish to continue IV fluids until he is discharged -Comfort medications ordered-  21m morphine concentrate SL q1hr prn SOB, air hunger Lorazepam 168mpo, SL or IV q4 hr anxiety -DC'd labs and CBG checks -Recommend that he sits completely upright when taking sips and bites. Take small bites and sips. Avoid straws-  will also consult SLP for strategies to make eating and drinking more comfortable for him    Code Status/Advance Care Planning: DNR   Prognosis:   < 6 months  Discharge Planning: Home with Hospice  Primary Diagnoses: Present on Admission:  Acute respiratory failure with hypoxia ([place])  Mass of upper lobe of left lung  Pleural effusion  PSVT (paroxysmal supraventricular tachycardia) (HCC)  Hypercholesteremia  Coronary artery disease  Hypertension  AKI (acute kidney injury) ([place])  Electrolyte abnormality  Rheumatoid arthritis ([place])  Hypothyroidism (acquired)   Review of Systems  Constitutional:  Positive for activity change and appetite change.  Respiratory:  Positive for shortness of breath.    Physical Exam Vitals and nursing note reviewed.  Pulmonary:     Comments: Dyspnea  at rest Neurological:     Mental Status: He is alert.     Motor: Weakness present.     Comments: Hard of hearing, hearing aids in place    Vital Signs: BP 110/64   Pulse 74   Temp 97.6 F (36.4 C) (Oral)   Resp 16   Wt 65.8 kg   SpO2 97%   BMI 22.06 kg/m  Pain Scale: 0-10   Pain Score: 0-No pain   SpO2: SpO2: 97 % O2 Device:SpO2: 97 % O2 Flow Rate: .O2 Flow Rate (L/min): 3 L/min  IO: Intake/output summary:  Intake/Output Summary (Last 24 hours) at 08/27/2021 1654 Last data filed at 08/27/2021 0700 Gross per 24 hour  Intake 244.79 ml  Output 800 ml  Net -555.21 ml    LBM: Last BM Date: 08/25/21 Baseline Weight: Weight: 65.8 kg Most recent weight: Weight: 65.8 kg     Palliative Assessment/Data: PPS: 40%       Thank you for this consult. Palliative medicine will continue to follow and assist as needed.   Time In: 1530 Time Out: 1730 Time Total: 120 mins Prolonged billing: yes Greater than 50%  of this time was spent counseling and coordinating care related to the above assessment and plan.  Signed by: Seth Simpson, AGNP-C Palliative Medicine    Please contact Palliative Medicine Team phone at [phone] for questions and concerns.  For individual provider: See Seth Simpson

## 2021-08-27 NOTE — Progress Notes (Addendum)
PROGRESS NOTE  Seth Simpson OZD:[id number] DOB: Oct 09, 1931 DOA: 08/13/2021 PCP: Josetta Huddle, MD  Brief History   87 year old with a history of CAD status post CABG, paroxysmal SVT on amiodarone, LBBB, DM2, HTN, HLD, hypothyroidism, depression, and BPH who presented to the ED with dyspnea.  CT chest in the ED revealed a possible left upper lobe lung mass and a moderate left-sided pleural effusion.  Patient was admitted to the acute units to undergo diagnostic and therapeutic thoracentesis. Thoracentesis removed 1.3 L amber fluid that was non-diagnostic. However, the patient underwent biopsy of lymph nodes that was positive for non-hodgkins lymphoma. Fluid has been re-accumulating and the patient undergwent therapeutic thoracentesis with pulmonary critical care on 08/26/2021. 1.5 liters removed. Patient remains short of breath.  Dr. Lorenso Courier with oncology has been consulted. He states that the patient is not a candidate for chemotherapy and has recommended palliative care. Palliative care has met with the family. The patient and his family wish to have pleurex catheter placed, and for the patient to go home with hospice.  IR has been consulted for catheter placement tomorrow.  Consultants  PCCM  Procedures  Thoracentesis x 2 FNA lymph nodes  Antibiotics  None  Subjective  The patient states that he is feeling more short of breath today.  Objective   Vitals:  Vitals:   08/27/21 0300 08/27/21 0843  BP: 127/76 110/64  Pulse: 66 74  Resp: 15 16  Temp: (!) 97.4 F (36.3 C) 97.6 F (36.4 C)  SpO2: 97%     Exam:  Constitutional:  Appears calm and comfortable Eyes:  pupils and irises appear normal Normal lids and conjunctivae ENMT:  grossly normal hearing  Lips appear normal external ears, nose appear normal Oropharynx: mucosa, tongue,posterior pharynx appear normal Neck:  neck appears normal, no masses, normal ROM, supple no thyromegaly Respiratory:  CTA bilaterally, no  w/r/r.  Respiratory effort normal. No retractions or accessory muscle use Cardiovascular:  RRR, no m/r/g No LE extremity edema   Normal pedal pulses Abdomen:  Abdomen appears normal; no tenderness or masses No hernias No HSM Musculoskeletal:  Digits/nails BUE: no clubbing, cyanosis, petechiae, infection exam of joints, bones, muscles of at least one of following: head/neck, RUE, LUE, RLE, LLE   strength and tone normal, no atrophy, no abnormal movements No tenderness, masses Normal ROM, no contractures  gait and station Skin:  No rashes, lesions, ulcers palpation of skin: no induration or nodules Neurologic:  CN 2-12 intact Sensation all 4 extremities intact Psychiatric:  Mental status Mood, affect appropriate Orientation to person, place, time  judgment and insight appear intact     I have personally reviewed the following:   Today's Data  CBC    Component Value Date/Time   WBC 10.3 08/27/2021 0210   RBC 3.35 (L) 08/27/2021 0210   HGB 11.7 (L) 08/27/2021 0210   HCT 36.0 (L) 08/27/2021 0210   PLT 256 08/27/2021 0210   MCV 107.5 (H) 08/27/2021 0210   MCH 34.9 (H) 08/27/2021 0210   MCHC 32.5 08/27/2021 0210   RDW 15.1 08/27/2021 0210   LYMPHSABS 0.7 08/27/2021 0210   MONOABS 0.9 08/27/2021 0210   EOSABS 0.3 08/27/2021 0210   BASOSABS 0.0 08/27/2021 0210   BMP Latest Ref Rng & Units 08/27/2021 08/26/2021 08/25/2021  Glucose 70 - 99 mg/dL 122(H) 151(H) 142(H)  BUN 8 - 23 mg/dL 14 12 15   Creatinine 0.61 - 1.24 mg/dL 1.22 1.25(H) 1.26(H)  Sodium 135 - 145 mmol/L 141 138 141  Potassium 3.5 - 5.1 mmol/L 3.8 3.9 3.6  Chloride 98 - 111 mmol/L 119(H) 117(H) 118(H)  CO2 22 - 32 mmol/L 16(L) 16(L) 18(L)  Calcium 8.9 - 10.3 mg/dL 7.5(L) 7.8(L) 7.6(L)   Micro Data  Urine culture: No growth  Imaging  CT Chest with contrast 08/13/2021  Cardiology Data  EKG 08/14/2021  Other Data  FNA, Lymph node 08/14/2021: Non-Hodgkins lymphoma  Scheduled Meds:  allopurinol  300 mg  Oral QPM   amLODipine  5 mg Oral Daily   artificial tears   Both Eyes QHS   enoxaparin (LOVENOX) injection  30 mg Subcutaneous Q24H   ferrous sulfate  325 mg Oral QODAY   finasteride  5 mg Oral Daily   folic acid  3 mg Oral q morning   guaiFENesin  1,200 mg Oral BID   insulin aspart  0-9 Units Subcutaneous TID WC   levothyroxine  50 mcg Oral Q0600   loratadine  10 mg Oral q morning   metoprolol succinate  25 mg Oral Daily   mometasone-formoterol  2 puff Inhalation BID   pantoprazole  40 mg Oral Q0600   rosuvastatin  10 mg Oral Q M,W,F   sertraline  50 mg Oral q morning   sodium chloride  1 drop Both Eyes QID   sodium chloride flush  3 mL Intravenous Q12H   Continuous Infusions:  sodium chloride Stopped (08/19/21 0943)    Principal Problem:   Acute respiratory failure with hypoxia (HCC) Active Problems:   Coronary artery disease   Hypertension   Hypercholesteremia   Rheumatoid arthritis (HCC)   PSVT (paroxysmal supraventricular tachycardia) (HCC)   DM II (diabetes mellitus, type II), controlled ([place])   Mass of upper lobe of left lung   Pleural effusion   AKI (acute kidney injury) ([place])   Electrolyte abnormality   Hypothyroidism (acquired)   LOS: 13 days   A & P  Acute respiratory failure with hypoxia (HCC) Due to multiple factors. Left upper lobe mass, pleural effusion. Pulmonology has been consulted, and the plan is for the patient to go for thoracentesis tomorrow. The patient has had thoracentesis by IR that was non-diagnostic and FNA of lymph nodes that was positive for Non-hodgkins lymphoma. . He has also undergone bronchoscopy with biopsy that is still pending. Repeat thoracentesis was performed with removal of another 1.5L cloudy fluid. The patient is currently saturating at 95% on 3 liters.   Dyspnea: Despite Oxygen saturations in the mid-nineties, and normal respiratory rate, the patient continues to complain of dyspnea and is displaying accessory muscle use and 2-3  word dyspnea. I discussed this with the patient and offered inhaled morphine to help with dyspnea. The patient was in agreement. Palliative care has been consulted.  Lactic Acidosis: Ongoing. Monitor and treat with IV fluids.   Diffuse Large B Cell Lymphoma: Patient is not a candidate for chemotherapy per Dr. Lorenso Courier. Palliative care consulted. The family wishes to take the patient home with hospice. Plan is for him to have pleurex catheter placed today.  AKI (acute kidney injury) ([place]) Baseline creatinine appears to be 1.0 - 1.04. Creatinine upon presentation was 1.78. Now improved to 1.22. Will continue to monitor creatinine, electrolytes and volume status. Avoid nephrotoxins and hypotension.  Coronary artery disease Noted and stable. Patient is s/p CABG in 2009. The patient is continued on metoprolol and crestor. Monitor on telemetry.  DM II (diabetes mellitus, type II), controlled ([place]) Controlled on metformin at home. Hemoglobin A1c was 7.2. Inpatient his glucoses  are being managed with FSBS and SSI. Glucoses have been 100-176 in the last 24 hours.  Electrolyte abnormality Patient with hypercalcemia and hypomagnesemia upon admission. Hypercalcemia due to malignance. Now normalized when corrected for albumin. Hypomagnesia has been made replete. Monitor and address as necessary. Monitor on telemetry. Phosphorous is low at 1.2 today. Will supplement.  Hypercholesteremia Noted. Crestor continued.  Hypertension Blood pressures are trending a little high. Will add amlodipine 5 mg daily. Monitor.  Hypothyroidism (acquired) Continue synthroid as at home.  Mass of upper lobe of left lung Biopsy of lymph nodes was positive for diffuse large B cell lymphoma. CT abdomen and pelvis for staging has been ordered by oncology. Per Dr. Lorenso Courier, the patient is not a candidate for chemotherapy. Palliative care has been consulted.   Pleural effusion For therapeutic thoracentesis tomorrow. 1.3 L removed  upon admission. Non-diagnostic for malignancy. 1.5 L removed for therapeutic purposes on 08/26/2021. Pt may require Pleurex catheter vs pleurodesis prior to discharge.  PSVT (paroxysmal supraventricular tachycardia) (HCC) Continue metoprolol and to monitor on telemetry.  Rheumatoid arthritis ([place]) Noted.    This patient was seen and examined by myself. I have spent 36 minutes in his evaluation and care. Inpatient status  DVT prophylaxis: Lovenox Code Status: DNR Family Communication: None available Disposition Plan: To be determined.    Jamani Bearce, DO [facility] Direct contact: see [email or web address]  7PM-7AM contact night coverage as above 08/27/2021, 12:45 PM  LOS: 12 days

## 2021-08-28 ENCOUNTER — Encounter (HOSPITAL_COMMUNITY)
Admission: EM | Disposition: A | Discharge status: Hospice | Payer: Self-pay | Source: Home / Self Care | Attending: Internal Medicine

## 2021-08-28 ENCOUNTER — Inpatient Hospital Stay (HOSPITAL_COMMUNITY): Payer: Medicare Other

## 2021-08-28 ENCOUNTER — Encounter (HOSPITAL_COMMUNITY): Payer: Self-pay | Admitting: Internal Medicine

## 2021-08-28 DIAGNOSIS — J9601 Acute respiratory failure with hypoxia: Secondary | ICD-10-CM | POA: Diagnosis not present

## 2021-08-28 HISTORY — PX: CHEST TUBE INSERTION: SHX231

## 2021-08-28 SURGERY — INSERTION, PLEURAL DRAINAGE CATHETER
Anesthesia: Topical | Laterality: Left

## 2021-08-28 MED ORDER — MORPHINE SULFATE (CONCENTRATE) 10 MG/0.5ML PO SOLN: 10.0000 mg | ORAL | Status: DC | PRN

## 2021-08-28 MED ORDER — ALBUMIN HUMAN 5 % IV SOLN
INTRAVENOUS | Status: AC
  Filled 2021-08-28: qty 250

## 2021-08-28 MED ORDER — ALBUMIN HUMAN 5 % IV SOLN
INTRAVENOUS | Status: AC
  Administered 2021-08-28: 12.5 g via INTRAVENOUS
  Filled 2021-08-28: qty 250

## 2021-08-28 MED ORDER — LIDOCAINE HCL (PF) 1 % IJ SOLN
INTRAMUSCULAR | Status: AC
  Filled 2021-08-28: qty 30

## 2021-08-28 MED ORDER — ALBUMIN HUMAN 5 % IV SOLN: 25.0000 g | Freq: Once | INTRAVENOUS | Status: DC

## 2021-08-28 MED ORDER — MORPHINE SULFATE (CONCENTRATE) 10 MG/0.5ML PO SOLN
5.0000 mg | Freq: Four times a day (QID) | ORAL | Status: DC
Start: 2021-08-28 — End: 2021-08-30
  Administered 2021-08-28 – 2021-08-30 (×8): 5 mg via ORAL
  Filled 2021-08-28 (×8): qty 0.5

## 2021-08-28 MED ORDER — ALBUMIN HUMAN 5 % IV SOLN: 12.5000 g | Freq: Once | INTRAVENOUS | Status: AC

## 2021-08-28 NOTE — Op Note (Signed)
Left PleurX Insertion Procedure Note  Seth Simpson  [id number]  [birth year]  Date:08/28/21  Time:3:59 PM   Provider Performing:Seth Simpson Seth Simpson  Procedure: PleurX Tunneled Pleural Catheter Placement ([id number])  Indication(s) Relief of dyspnea from recurrent effusion  Consent Risks of the procedure as well as the alternatives and risks of each were explained to the patient and/or caregiver.  Consent for the procedure was obtained.  Anesthesia Topical only with 1% lidocaine   Time Out Verified patient identification, verified procedure, site/side was marked, verified correct patient position, special equipment/implants available, medications/allergies/relevant history reviewed, required imaging and test results available.  Sterile Technique Maximal sterile technique including sterile barrier drape, hand hygiene, sterile gown, sterile gloves, mask, hair covering.  Procedure Description Ultrasound used to identify appropriate pleural anatomy for placement and overlying skin marked.  Area of drainage cleaned and draped in sterile fashion.   Lidocaine was used to anesthetize the skin and subcutaneous tissue.   1.5 cm incision made overlying fluid and another about 5 cm anterior to this along chest wall.  PleurX catheter inserted in usual sterile fashion using modified seldinger technique.  Interrupted silk sutures placed at catheter insertion and tunneling points which will be removed at later date.  PleurX catheter then hooked to suction.  After fluid aspirated, pleurX capped and sterile dressing applied.  Complications/Tolerance None; patient tolerated the procedure well. Chest X-ray is ordered to confirm no post-procedural complication.  EBL Minimal  Specimen(s) fluid   Garner Nash, DO [facility] 08/28/2021 3:59 PM

## 2021-08-28 NOTE — Interval H&P Note (Signed)
History and Physical Interval Note:  08/28/2021 3:07 PM  Seth Simpson  has presented today for surgery, with the diagnosis of Malignant Pleural Effusion.  The various methods of treatment have been discussed with the patient and family. After consideration of risks, benefits and other options for treatment, the patient has consented to  Procedure(s): INSERTION PLEURAL DRAINAGE CATHETER (Left) as a surgical intervention.  The patient's history has been reviewed, patient examined, no change in status, stable for surgery.  I have reviewed the patient's chart and labs.  Questions were answered to the patient's satisfaction.     [place]

## 2021-08-28 NOTE — Progress Notes (Signed)
Patient off floor to Endo.   

## 2021-08-28 NOTE — Progress Notes (Signed)
Palliative Medicine RN Note: Daily symptom check.  Pt is in bed with obviously labored breathing. Daughter Diane and her husband are at bedside. They report that morphine sublingual helped, but the morphine nebs did not help. Family is concerned that pt isn't requesting meds d/t not wanting to bother people. Made a plan with pt and family to start scheduled morphine (Roxanol) with breakthrough doses available as needed.  Family reports pt is due to get PleurX at 1430, so we are all hesitant to give morphine now. After procedure, he can certainly get whatever he needs.  Family asked about hospice referral. We discussed hospice provider options in the area, and family elected [facility]. I called the referral in, and I let them know he needs O2 and a bed, and that he will have a PleurX.   Our team will follow for symptoms until he is discharged.  Updated RN CM Festus Holts, as well as pt's Marine scientist.  Marjie Skiff Azana Kiesler, RN, BSN, [facility] Palliative Medicine Team 08/28/2021 2:44 PM Office [phone]

## 2021-08-28 NOTE — Progress Notes (Signed)
Brief Oncology Progress Note  I spoke with Seth Simpson and his daughter as well as his son-in-law and family friends.  The patient underwent a CT scan of his abdomen which did show lymphadenopathy in the abdomen as well.  This would put the patient at least a stage III or IV for his diffuse large B-cell lymphoma.  Given his advanced age, level of deconditioning, prior heart history I do not believe that he would be a good candidate for intensive treatment.  Palliative treatment could be considered but I do not believe it would change the big picture or alter his clinical course.  As such I am in agreement that hospice based care would be most appropriate.  I would estimate remaining time of life as less than 6 months, probably less.  The family and the patient noted that he thought this was the best course of action moving forward.  Oncology will continue to be involved if the patient has any questions concerns or if we are needed for further goals of care discussions.  Ledell Peoples, MD Department of Hematology/Oncology [place] [facility] Phone: [phone] Pager: [phone] Email: [email or web address]

## 2021-08-28 NOTE — Progress Notes (Signed)
PROGRESS NOTE  Seth Simpson QHU:[id number] DOB: 03-17-1931 DOA: 08/13/2021 PCP: Josetta Huddle, MD  Brief History   87 year old with a history of CAD status post CABG, paroxysmal SVT on amiodarone, LBBB, DM2, HTN, HLD, hypothyroidism, depression, and BPH who presented to the ED with dyspnea.  CT chest in the ED revealed a possible left upper lobe lung mass and a moderate left-sided pleural effusion.  Patient was admitted to the acute units to undergo diagnostic and therapeutic thoracentesis. Thoracentesis removed 1.3 L amber fluid that was non-diagnostic. However, the patient underwent biopsy of lymph nodes that was positive for non-hodgkins lymphoma. Fluid has been re-accumulating and the patient undergwent therapeutic thoracentesis with pulmonary critical care on 08/26/2021. 1.5 liters removed. Patient remains short of breath.  Dr. Lorenso Courier with oncology has been consulted. He states that the patient is not a candidate for chemotherapy and has recommended palliative care. Palliative care has met with the family. The patient and his family wish to have pleurex catheter placed, and for the patient to go home with hospice.  CT abdomen and pelvis demonstrated peritoneal lymphomatosis, wedge shaped area of hypoperfusion seen in the lower pole of the right kidney likely representing infarct. There are enlarged retroperitoneal pelvic and inguinal lymph nodes. There is also nodular pleural thickening and left greater than right pleural effusions.   Dr. Lorenso Courier states that this likely results in stage III or IV diffuse large B Cell lymphoma.  Pulmonology has placed pleurex catheter. The patient will be discharged to home with hospice once all arrangements for his care have been made.  Consultants  PCCM  Procedures  Thoracentesis x 2 FNA lymph nodes  Antibiotics  None  Subjective  The patient is resting comfortably. No new complaints.  Objective   Vitals:  Vitals:   08/28/21 0404 08/28/21 1205   BP: 121/85 100/67  Pulse: 77 76  Resp: 19 17  Temp: 98.6 F (37 C) 98.5 F (36.9 C)  SpO2: 95% 94%    Exam:  Constitutional:  The patient is resting comfortably. No new complaints.  Respiratory:  CTA bilaterally, no w/r/r.  Respiratory effort normal. No retractions or accessory muscle use Cardiovascular:  RRR, no m/r/g No LE extremity edema   Normal pedal pulses Abdomen:  Abdomen appears normal; no tenderness or masses No hernias No HSM Musculoskeletal:  Digits/nails BUE: no clubbing, cyanosis, petechiae, infection exam of joints, bones, muscles of at least one of following: head/neck, RUE, LUE, RLE, LLE   strength and tone normal, no atrophy, no abnormal movements No tenderness, masses Normal ROM, no contractures  gait and station Skin:  No rashes, lesions, ulcers palpation of skin: no induration or nodules Neurologic:  Patient is unable to cooperate with exam. Psychiatric:  Patient is unable to cooperate with exam.   I have personally reviewed the following:   Today's Data  CBC    Component Value Date/Time   WBC 10.3 08/27/2021 0210   RBC 3.35 (L) 08/27/2021 0210   HGB 11.7 (L) 08/27/2021 0210   HCT 36.0 (L) 08/27/2021 0210   PLT 256 08/27/2021 0210   MCV 107.5 (H) 08/27/2021 0210   MCH 34.9 (H) 08/27/2021 0210   MCHC 32.5 08/27/2021 0210   RDW 15.1 08/27/2021 0210   LYMPHSABS 0.7 08/27/2021 0210   MONOABS 0.9 08/27/2021 0210   EOSABS 0.3 08/27/2021 0210   BASOSABS 0.0 08/27/2021 0210   BMP Latest Ref Rng & Units 08/27/2021 08/26/2021 08/25/2021  Glucose 70 - 99 mg/dL 122(H) 151(H) 142(H)  BUN  8 - 23 mg/dL 14 12 15   Creatinine 0.61 - 1.24 mg/dL 1.22 1.25(H) 1.26(H)  Sodium 135 - 145 mmol/L 141 138 141  Potassium 3.5 - 5.1 mmol/L 3.8 3.9 3.6  Chloride 98 - 111 mmol/L 119(H) 117(H) 118(H)  CO2 22 - 32 mmol/L 16(L) 16(L) 18(L)  Calcium 8.9 - 10.3 mg/dL 7.5(L) 7.8(L) 7.6(L)   Micro Data  Urine culture: No growth  Imaging  CT Chest with contrast  08/13/2021 CT abdomen and pelvis 08/27/2021  Cardiology Data  EKG 08/14/2021  Other Data  FNA, Lymph node 08/14/2021: Non-Hodgkins lymphoma  Scheduled Meds:  allopurinol  300 mg Oral QPM   amLODipine  5 mg Oral Daily   artificial tears   Both Eyes QHS   finasteride  5 mg Oral Daily   folic acid  3 mg Oral q morning   levothyroxine  50 mcg Oral Q0600   loratadine  10 mg Oral q morning   methotrexate  12.5 mg Oral Q Fri   metoprolol succinate  25 mg Oral Daily   mometasone-formoterol  2 puff Inhalation BID   pantoprazole  40 mg Oral Q0600   senna  1 tablet Oral QHS   sertraline  50 mg Oral q morning   sodium chloride  1 drop Both Eyes QID   sodium chloride flush  3 mL Intravenous Q12H   Continuous Infusions:  sodium chloride Stopped (08/19/21 0943)    Principal Problem:   Acute respiratory failure with hypoxia (HCC) Active Problems:   Coronary artery disease   Hypertension   Hypercholesteremia   Rheumatoid arthritis (HCC)   PSVT (paroxysmal supraventricular tachycardia) (HCC)   DM II (diabetes mellitus, type II), controlled (HCC)   Mass of upper lobe of left lung   Pleural effusion   AKI (acute kidney injury) ([place])   Electrolyte abnormality   Hypothyroidism (acquired)   LOS: 14 days   A & P  Acute respiratory failure with hypoxia (HCC) Due to multiple factors. Left upper lobe mass, pleural effusion. Pulmonology has been consulted, and the plan is for the patient to go for thoracentesis tomorrow. The patient has had thoracentesis by IR that was non-diagnostic and FNA of lymph nodes that was positive for Non-hodgkins lymphoma. . He has also undergone bronchoscopy with biopsy that is still pending. Repeat thoracentesis was performed with removal of another 1.5L cloudy fluid. The patient is currently saturating at 92% on 3 liters.   Dyspnea: Despite Oxygen saturations in the mid-nineties, and normal respiratory rate, the patient continues to complain of dyspnea and is displaying  accessory muscle use and 2-3 word dyspnea. I discussed this with the patient and offered inhaled morphine to help with dyspnea. The patient was in agreement. Palliative care was consulted. The patient and family have decided that the patient will go home with hospice.   Lactic Acidosis: Ongoing. Monitor and treat with IV fluids.   Diffuse Large B Cell Lymphoma: Patient is not a candidate for chemotherapy per Dr. Lorenso Courier. Palliative care consulted. The family wishes to take the patient home with hospice. Pleurex catheter inserted by pulmonology today. Although Dr. Lorenso Courier is able to offer only pallilative chemotherapy, he is in agreement with the hospice decision. He remains available for questions and to assist the patient and family.  AKI (acute kidney injury) ([place]) Baseline creatinine appears to be 1.0 - 1.04. Creatinine upon presentation was 1.78. Now improved to 1.22. Will continue to monitor creatinine, electrolytes and volume status. Avoid nephrotoxins and hypotension.  Coronary artery  disease Noted and stable. Patient is s/p CABG in 2009. The patient is continued on metoprolol and crestor. Monitor on telemetry.  DM II (diabetes mellitus, type II), controlled ([place]) Controlled on metformin at home. Hemoglobin A1c was 7.2. Inpatient his glucoses are being managed with FSBS and SSI. Glucoses have been 100-176 in the last 24 hours.  Electrolyte abnormality Patient with hypercalcemia and hypomagnesemia upon admission. Hypercalcemia due to malignance. Now normalized when corrected for albumin. Hypomagnesia has been made replete. Monitor and address as necessary. Monitor on telemetry. Phosphorous is low at 1.2 today. Will supplement.  Hypercholesteremia Noted. Crestor continued.  Hypertension Blood pressures are trending a little high. Will add amlodipine 5 mg daily. Monitor.  Hypothyroidism (acquired) Continue synthroid as at home.  Mass of upper lobe of left lung Biopsy of lymph nodes was  positive for diffuse large B cell lymphoma. CT abdomen and pelvis for staging has been ordered by oncology. Per Dr. Lorenso Courier, the patient is not a candidate for chemotherapy. Palliative care has been consulted, and the patient and family has decided that the patient will be discharged to home with hospice.  Pleural effusion For therapeutic thoracentesis tomorrow. 1.3 L removed upon admission. Non-diagnostic for malignancy. 1.5 L removed for therapeutic purposes on 08/26/2021. Pulmonology has placed a pleurex catheter for ease of managing the pleural effusion.  PSVT (paroxysmal supraventricular tachycardia) (HCC) Continue metoprolol and to monitor on telemetry.  Rheumatoid arthritis ([place]) Noted.    This patient was seen and examined by myself. I have spent 32 minutes in his evaluation and care. Inpatient status  DVT prophylaxis: Lovenox Code Status: DNR Family Communication: None available Disposition Plan: Home with hospice when all arrangements have been made.  Seth Holston, DO [facility] Direct contact: see [email or web address]  7PM-7AM contact night coverage as above 08/28/2021, 1:58 PM  LOS: 12 days

## 2021-08-29 DIAGNOSIS — Z515 Encounter for palliative care: Secondary | ICD-10-CM | POA: Diagnosis not present

## 2021-08-29 DIAGNOSIS — Z7189 Other specified counseling: Secondary | ICD-10-CM | POA: Diagnosis not present

## 2021-08-29 DIAGNOSIS — Z66 Do not resuscitate: Secondary | ICD-10-CM

## 2021-08-29 NOTE — Progress Notes (Signed)
   Palliative Medicine Inpatient Follow Up Note  Reason for Consultation: GOC and symptom management   HPI/Patient Profile: 87 y.o. male  with past medical history of CAD s/p CABG, paroxysmal SVT, HTN, DM2, HTN, HLD, hypothyroid, depression, BPH admitted on 08/13/2021 with dyspnea on exertion. Thorough workup has revealed lung mass with reaccumuluating pleural effusions. Biopsy of lymph nodes indicates diffuse large B-Cell lymphoma. Oncology consulted and he is not a candidate for treatment. Palliative consulted for GOC and symptom management.   Today's Discussion (08/29/2021):  *Please note that this is a verbal dictation therefore any spelling or grammatical errors are due to the "[employer]" system interpretation.  Chart reviewed.  I met with Seth Simpson at bedside this morning.  He shares with me that he feels "thirsty for something cold".  I was able to assist him by getting him some ice water.  He goes on to tell me that he is not experiencing any chest pain, nausea, he does share that he is chronically feeling short of breath.  I noted that his oxygen was not in his nose therefore was properly placed with improvement in symptoms.  [name] and I reviewed the plan for him to transition home with hospice once everything is delivered to his home environment.  He is in agreement with this presently.  Questions and concerns addressed   Objective Assessment: Vital Signs Vitals:   08/29/21 0405 08/29/21 0837  BP: 129/67   Pulse: 77   Resp: (!) 23   Temp: 98.4 F (36.9 C)   SpO2: (!) 87% 92%    Intake/Output Summary (Last 24 hours) at 08/29/2021 1242 Last data filed at 08/29/2021 1139 Gross per 24 hour  Intake 360 ml  Output 600 ml  Net -240 ml   Last Weight  Most recent update: 08/13/2021 10:27 PM    Weight  65.8 kg (145 lb 1 oz)            Physical Exam Vitals and nursing note reviewed.  Pulmonary:     Comments: Dyspnea at rest Neurological:     Mental Status: He is alert.      Motor: Weakness present.     Comments: Hard of hearing, hearing aids in place   SUMMARY OF RECOMMENDATIONS   DNAR/DNI  S/P PleurX placement yesterday  Comfort medications ordered-  55m morphine concentrate SL Q6H ATC and 166mq1hr prn SOB, air hunger Lorazepam 62m58mo, SL or IV q4 hr anxiety  Plan to transition home with HOP tomorrow   Time Spent:25 Greater than 50% of the time was spent in counseling and coordination of care ______________________________________________________________________________________ [facility] [place] Cell Phone: [phone] utilize secure chat with additional questions, if there is no response within 30 minutes please call the above phone number  Palliative Medicine Team providers are available by phone from 7am to 7pm daily and can be reached through the team cell phone.  Should this patient require assistance outside of these hours, please call the patient's attending physician.

## 2021-08-29 NOTE — Progress Notes (Signed)
PCCM Progress Note  Patient seen and evaluated this am with no acute issues overnight or complaints this am. States he feels better this am post PleurX cath placement 9/17.Had 1,039ms removed after PleurX placed 9/16. Begin drainage of PleurX cath 9/18, order written.   Currently awaiting Hospice discharge home.  PCCM will sign off. Thank you for the opportunity to participate in this patient's care. Please contact if we can be of further assistance.  Zahlia Deshazer D. HKenton Kingfisher NP-C [facility] Personal contact information can be found on Amion  08/29/2021, 10:20 AM

## 2021-08-29 NOTE — Progress Notes (Addendum)
PROGRESS NOTE  Seth Simpson [id number] DOB: March 20, 1931 DOA: 08/13/2021 PCP: Josetta Huddle, MD  HPI/Recap of past 24 hours:  Patient seen and examined at bedside her his daughter Shauna Hugh and her husband at bedside. Diane is requesting for telemetry to be discontinued Patient stated that he did well he is status postthoracentesis which has helped with his shortness of breath Hospitalist/palliative care involved she is waiting for hospice discharge bed to be delivered  Assessment/Plan: Principal Problem:   Acute respiratory failure with hypoxia ([place]) Active Problems:   Coronary artery disease   Hypertension   Hypercholesteremia   Rheumatoid arthritis (HCC)   PSVT (paroxysmal supraventricular tachycardia) (HCC)   DM II (diabetes mellitus, type II), controlled ([place])   Mass of upper lobe of left lung   Pleural effusion   AKI (acute kidney injury) ([place])   Electrolyte abnormality   Hypothyroidism (acquired)  1.  Acute respiratory failure with hypoxia Heat is multifactorial.  He had a Pleurx catheter placed with improvement He is saturating well  2.  Dyspnea Patient is receiving inhaled morphine to help with dyspnea He has discontinued his hemodialysis being of sound mind  3.  Lactic acidosis continue with IV fluid  4.  B cell lymphoma Patient is said not to be a candidate for chemotherapy.  Dr. Lorenso Courier Palliative care consult has been obtained and are involved Pleurx catheter inserted by pulmonology Waiting for hospice  5.  Type 2 diabetes mellitus Current hemoglobin is 7.2 Continue sliding scale insulin   6.  Bilateral pleural effusion patient is status postthoracentesis Status post Pleurx catheter  7.  Hypothyroidism continue Synthroid  8.  Paroxysmal supraventricular tachycardia Continue metoprolol and telemetry monitor  9.  Rheumatoid arthritis  Code Status: DNR  Severity of Illness: The appropriate patient status for this patient is INPATIENT. Inpatient  status is judged to be reasonable and necessary in order to provide the required intensity of service to ensure the patient's safety. The patient's presenting symptoms, physical exam findings, and initial radiographic and laboratory data in the context of their chronic comorbidities is felt to place them at high risk for further clinical deterioration. Furthermore, it is not anticipated that the patient will be medically stable for discharge from the hospital within 2 midnights of admission. The following factors support the patient status of inpatient.    * I certify that at the point of admission it is my clinical judgment that the patient will require inpatient hospital care spanning beyond 2 midnights from the point of admission due to high intensity of service, high risk for further deterioration and high frequency of surveillance required.*   Family Communication: Daughter Diane and her husband Bill at bedside  Disposition Plan:  Status is: Inpatient   Dispo: The patient is from: Home              Anticipated d/c is to: Home with hospice              Anticipated d/c date is: 1 to 2 days              Patient currently not medically stable for discharge  Consultants:  Palliative care PCCM.  Signed off today Oncology  Procedures: Thoracentesis x2 Pleurx catheter placement August 29, 2021 1000 mils of fluid removed Fine-needle aspiration of lymph node  Antimicrobials: None    DVT prophylaxis: Lovenox discontinued on September 15   Objective: Vitals:   08/28/21 1955 08/28/21 2002 08/29/21 0405 08/29/21 0837  BP: 100/61  129/67   Pulse: 69  77   Resp: (!) 21  (!) 23   Temp: 98.5 F (36.9 C)  98.4 F (36.9 C)   TempSrc: Oral  Oral   SpO2: 95% 94% (!) 87% 92%  Weight:        Intake/Output Summary (Last 24 hours) at 08/29/2021 0905 Last data filed at 08/28/2021 1823 Gross per 24 hour  Intake 240 ml  Output 600 ml  Net -360 ml   Filed Weights   08/13/21 2200   Weight: 65.8 kg   Body mass index is 22.06 kg/m.  Exam:  General: 87 y.o. year-old male well developed well nourished in no acute distress.  Alert and oriented x3.   no respiratory distress at rest Cardiovascular: Regular rate and rhythm with no rubs or gallops.  No thyromegaly or JVD noted.   Respiratory: Clear to auscultation with no wheezes or rales. Good inspiratory effort. Abdomen: Soft nontender nondistended with normal bowel sounds x4 quadrants. Musculoskeletal: No lower extremity edema. 2/4 pulses in all 4 extremities. Skin: No ulcers or lesions or rash Psychiatry: Mood is appropriate for condition and setting Neurology:    Data Reviewed: CBC: Recent Labs  Lab 08/23/21 0357 08/26/21 0139 08/27/21 0210  WBC 11.4* 11.0* 10.3  NEUTROABS  --  8.6* 8.2*  HGB 11.5* 12.1* 11.7*  HCT 35.6* 36.9* 36.0*  MCV 109.2* 107.6* 107.5*  PLT 272 277 [id number]   Basic Metabolic Panel: Recent Labs  Lab 08/23/21 0357 08/24/21 0227 08/25/21 0521 08/26/21 0139 08/27/21 0210 08/27/21 0820  NA 137 140 141 138 141  --   K 4.2 4.0 3.6 3.9 3.8  --   CL 111 115* 118* 117* 119*  --   CO2 17* 19* 18* 16* 16*  --   GLUCOSE 156* 148* 142* 151* 122*  --   BUN 37* 25* '15 12 14  '$ --   CREATININE 1.84* 1.54* 1.26* 1.25* 1.22  --   CALCIUM 8.2* 8.0* 7.6* 7.8* 7.5*  --   PHOS  --   --   --   --   --  1.2*   GFR: Estimated Creatinine Clearance: 39 mL/min (by C-G formula based on SCr of 1.22 mg/dL). Liver Function Tests: Recent Labs  Lab 08/23/21 0357 08/24/21 0227 08/25/21 0521 08/26/21 0139  AST 41 69* 30 35  ALT 37 '31 27 29  '$ ALKPHOS 79 90 76 74  BILITOT 0.5 0.6 0.7 1.0  PROT 5.0* 5.0* 4.9* 5.2*  ALBUMIN 2.0* 2.0* 2.3* 2.4*   No results for input(s): LIPASE, AMYLASE in the last 168 hours. No results for input(s): AMMONIA in the last 168 hours. Coagulation Profile: No results for input(s): INR, PROTIME in the last 168 hours. Cardiac Enzymes: No results for input(s): CKTOTAL, CKMB,  CKMBINDEX, TROPONINI in the last 168 hours. BNP (last 3 results) No results for input(s): PROBNP in the last 8760 hours. HbA1C: No results for input(s): HGBA1C in the last 72 hours. CBG: Recent Labs  Lab 08/26/21 1210 08/26/21 1616 08/26/21 1948 08/27/21 0803 08/27/21 1203  GLUCAP 119* 186* 102* 125* 148*   Lipid Profile: No results for input(s): CHOL, HDL, LDLCALC, TRIG, CHOLHDL, LDLDIRECT in the last 72 hours. Thyroid Function Tests: No results for input(s): TSH, T4TOTAL, FREET4, T3FREE, THYROIDAB in the last 72 hours. Anemia Panel: No results for input(s): VITAMINB12, FOLATE, FERRITIN, TIBC, IRON, RETICCTPCT in the last 72 hours. Urine analysis:    Component Value Date/Time   COLORURINE YELLOW 08/14/2021 1753   APPEARANCEUR  CLEAR 08/14/2021 1753   LABSPEC 1.020 08/14/2021 1753   PHURINE 5.0 08/14/2021 1753   GLUCOSEU NEGATIVE 08/14/2021 1753   HGBUR LARGE (A) 08/14/2021 1753   BILIRUBINUR NEGATIVE 08/14/2021 1753   KETONESUR NEGATIVE 08/14/2021 1753   PROTEINUR NEGATIVE 08/14/2021 1753   UROBILINOGEN 0.2 08/13/2010 0647   NITRITE NEGATIVE 08/14/2021 1753   LEUKOCYTESUR NEGATIVE 08/14/2021 1753   Sepsis Labs: '@LABRCNTIP'$ (procalcitonin:4,lacticidven:4)  )No results found for this or any previous visit (from the past 240 hour(s)).    Studies: DG CHEST PORT 1 VIEW  Result Date: 08/28/2021 CLINICAL DATA:  Left-sided pleural drainage catheter. Malignant pleural effusion. EXAM: PORTABLE CHEST 1 VIEW COMPARISON:  08/26/2021 FINDINGS: Midline trachea. Mild cardiomegaly. A left-sided pleural catheter terminates over the inferolateral left pleural space. A small pneumothorax is seen, on the order of 5-10%. Small volume left-sided pleural fluid as well. Soft tissue fullness within both hila, secondary to adenopathy on prior CT. Worsened aeration, with moderate interstitial edema. Lower lung predominant airspace disease is similar on the left and progressive on the right.  IMPRESSION: Placement of a left-sided pleural drain with development of a 5-10% pneumothorax. Worsened aeration with developing congestive heart failure. Similar left and progressive right base airspace disease which could represent atelectasis or concurrent infection or aspiration. Similar small volume left pleural fluid. Electronically Signed   By: Abigail Miyamoto M.D.   On: 08/28/2021 16:40    Scheduled Meds:  allopurinol  300 mg Oral QPM   amLODipine  5 mg Oral Daily   artificial tears   Both Eyes QHS   finasteride  5 mg Oral Daily   folic acid  3 mg Oral q morning   levothyroxine  50 mcg Oral Q0600   loratadine  10 mg Oral q morning   methotrexate  12.5 mg Oral Q Fri   metoprolol succinate  25 mg Oral Daily   mometasone-formoterol  2 puff Inhalation BID   morphine CONCENTRATE  5 mg Oral Q6H   pantoprazole  40 mg Oral Q0600   senna  1 tablet Oral QHS   sertraline  50 mg Oral q morning   sodium chloride  1 drop Both Eyes QID   sodium chloride flush  3 mL Intravenous Q12H    Continuous Infusions:  sodium chloride Stopped (08/19/21 0943)     LOS: 15 days     Cristal Deer, MD [facility]  To reach me or the doctor on call, go to: [email or web address] Password [email or web address]  08/29/2021, 9:05 AM

## 2021-08-29 NOTE — TOC Progression Note (Signed)
Transition of Care [facility]) - Progression Note    Patient Details  Name: Seth Simpson MRN: [id number] Date of Birth: 1931-01-31  Transition of Care [facility]) CM/SW Contact  Coralee P[place], [place] Phone Number: 08/29/2021, 12:29 PM  Clinical Narrative:    [facility] [place] to discuss barriers to [place]. Pt is still waiting on a hospital bed to be delivered. Hospice will follow up with CSW when they have everything for DC. MD noted pt is ready from a medical standpoint. Plan to discharge tomorrow. TOC will continue to follow.   Expected Discharge Plan: [place] Barriers to Discharge: Continued Medical Work up  Expected Discharge Plan and Services Expected Discharge Plan: [place] In-house Referral: NA Discharge Planning Services: CM Consult   Living arrangements for the past 2 months: Single Family Home                 DME Arranged: Walker rolling with seat DME Agency: [employer] Date DME Agency Contacted: 08/22/21 Time DME Agency Contacted: [phone] Representative spoke with at DME Agency: [place]: RN, PT, OT, Nurse's Aide [place] Agency: [place] Date [place]: 08/22/21 Time [place]: [id number] Representative spoke with at [place]: [place] ([place]) Interventions    Readmission Risk Interventions Readmission Risk Prevention Plan 08/24/2021  Transportation Screening Complete  PCP or Specialist Appt within 3-5 Days Complete  HRI or [place] Complete  Social Work Consult for [place] Planning/Counseling Complete  Palliative Care Screening Complete  Medication Review Press photographer) Complete  Some recent data might be hidden

## 2021-08-30 ENCOUNTER — Encounter (HOSPITAL_COMMUNITY): Payer: Self-pay | Admitting: Pulmonary Disease

## 2021-08-30 DIAGNOSIS — J9601 Acute respiratory failure with hypoxia: Secondary | ICD-10-CM | POA: Diagnosis not present

## 2021-08-30 DIAGNOSIS — Z66 Do not resuscitate: Secondary | ICD-10-CM | POA: Diagnosis not present

## 2021-08-30 DIAGNOSIS — Z7189 Other specified counseling: Secondary | ICD-10-CM | POA: Diagnosis not present

## 2021-08-30 DIAGNOSIS — Z515 Encounter for palliative care: Secondary | ICD-10-CM | POA: Diagnosis not present

## 2021-08-30 MED ORDER — ALBUTEROL SULFATE HFA 108 (90 BASE) MCG/ACT IN AERS: 2.0000 | INHALATION_SPRAY | Freq: Four times a day (QID) | RESPIRATORY_TRACT | 2 refills | Status: AC | PRN

## 2021-08-30 MED ORDER — MORPHINE SULFATE (CONCENTRATE) 10 MG/0.5ML PO SOLN: 5.0000 mg | Freq: Four times a day (QID) | ORAL | 0 refills | Status: AC

## 2021-08-30 MED ORDER — MOMETASONE FURO-FORMOTEROL FUM 200-5 MCG/ACT IN AERO: 2.0000 | INHALATION_SPRAY | Freq: Two times a day (BID) | RESPIRATORY_TRACT | 0 refills | Status: AC

## 2021-08-30 MED ORDER — ONDANSETRON HCL 4 MG PO TABS: 4.0000 mg | ORAL_TABLET | Freq: Four times a day (QID) | ORAL | 0 refills | Status: AC | PRN

## 2021-08-30 MED ORDER — SENNA 8.6 MG PO TABS: 1.0000 | ORAL_TABLET | Freq: Every day | ORAL | 0 refills | Status: AC

## 2021-08-30 NOTE — Discharge Summary (Signed)
Discharge Summary  GAMAL TODISCO TMA:[id number] DOB: 1931/11/19  PCP: Josetta Huddle, MD  Admit date: 08/13/2021 Discharge date: 08/30/2021  Time spent: 35 minutes  Recommendations for Outpatient Follow-up:  Primary care provider  Discharge Diagnoses:  Active Hospital Problems   Diagnosis Date Noted   Acute respiratory failure with hypoxia ([place]) 08/13/2021   AKI (acute kidney injury) ([place]) 08/26/2021   Electrolyte abnormality 08/26/2021   Hypothyroidism (acquired) 08/26/2021   Mass of upper lobe of left lung 08/13/2021   Pleural effusion 08/13/2021   DM II (diabetes mellitus, type II), controlled ([place])    PSVT (paroxysmal supraventricular tachycardia) ([place]) 04/22/2015   Rheumatoid arthritis ([place]) 10/02/2013   Hypercholesteremia    Coronary artery disease    Hypertension     Resolved Hospital Problems  No resolved problems to display.    Discharge Condition: Improved discharge home with hospice  Diet recommendation: Regular  Vitals:   08/30/21 0300 08/30/21 0841  BP: (!) 102/58   Pulse: 72   Resp: 16   Temp: 98 F (36.7 C)   SpO2: 96% 96%    History of present illness:  This is an 87 year old male with history of coronary disease status post CABG, paroxysmal supraventricular tachycardia on amiodarone left bundle branch block, type 2 diabetes mellitus, hypertension, hyperlipidemia, hypothyroidism, depression, benign prostatic hypertrophy who presented to the emergency department with dyspnea CT scan done in the ED revealed left upper lobe lung mass and moderate left sided pleural effusion.  Patient was admitted for acute pleural effusion and underwent diagnostic/therapeutic thoracentesis.  Thoracentesis yielded 1.3 L of amber fluid that was nondiagnostic.  He underwent biopsy of the lymph node that was positive for non-Hodgkin lymphoma.Marland Kitchen  He underwent another thoracentesis on August 26, 2021 with removal of 1.5 L.  And remains with shortness of breath  Hospital  Course:  Principal Problem:   Acute respiratory failure with hypoxia (H[facility]) Active Problems:   Coronary artery disease   Hypertension   Hypercholesteremia   Rheumatoid arthritis ([facility])   PSVT (paroxysmal supraventricular tachycardia) ([facility])   DM II (diabetes mellitus, type II), controlled ([facility])   Mass of upper lobe of left lung   Pleural effusion   AKI (acute kidney injury) ([place])   Electrolyte abnormality   Hypothyroidism (acquired)  This is an 87 year old male with history of coronary disease status post CABG, paroxysmal supraventricular tachycardia on amiodarone left bundle branch block, type 2 diabetes mellitus, hypertension, hyperlipidemia, hypothyroidism, depression, benign prostatic hypertrophy who presented to the emergency department with dyspnea CT scan done in the ED revealed left upper lobe lung mass and moderate left sided pleural effusion.  Patient was admitted for acute pleural effusion and underwent diagnostic/therapeutic thoracentesis.  Thoracentesis yielded 1.3 L of amber fluid that was nondiagnostic.  He underwent biopsy of the lymph node that was positive for non-Hodgkin lymphoma.Marland Kitchen  He underwent another thoracentesis on August 26, 2021 with removal of 1.5 L.  And remains with shortness of breath.  Dr. Lorenso Courier oncology was consulted and he stated patient is not a candidate for chemotherapy and recommended palliative care patient met with palliative care in presence of her family members and they have agreed to discharge him home with hospice.  Procedures: Thoracentesis x2 Pleurx catheter placement Fine-needle aspiration of lymph node  Consultations: Oncology PCCM Palliative care  Discharge Exam: BP (!) 102/58 (BP Location: Left Arm)   Pulse 72   Temp 98 F (36.7 C) (Oral)   Resp 16   Wt 65.8 kg   SpO2  96%   BMI 22.06 kg/m   General: Alert oriented in good spirit Cardiovascular: Regular rate and rhythm Respiratory: Clear to auscultation  Discharge  Instructions You were cared for by a hospitalist during your hospital stay. If you have any questions about your discharge medications or the care you received while you were in the hospital after you are discharged, you can call the unit and asked to speak with the hospitalist on call if the hospitalist that took care of you is not available. Once you are discharged, your primary care physician will handle any further medical issues. Please note that NO REFILLS for any discharge medications will be authorized once you are discharged, as it is imperative that you return to your primary care physician (or establish a relationship with a primary care physician if you do not have one) for your aftercare needs so that they can reassess your need for medications and monitor your lab values.  Discharge Instructions     Call MD for:  severe uncontrolled pain   Complete by: As directed    Call MD for:  temperature >100.4   Complete by: As directed    Diet - low sodium heart healthy   Complete by: As directed    Discharge instructions   Complete by: As directed    Discharge home with hospice   Discharge wound care:   Complete by: As directed    Dressing of Pleurx site per instruction   Increase activity slowly   Complete by: As directed    Pleural Drainage Schedule   Complete by: As directed    Pleural drainage schedule to start 9/18. Drain daily, up to max of 1L until patient is only able to drain out 1100m. If <1516mfor 3 consecutive drains then drain every other day. If <15056mor 3 consecutive drains every other day then call the practice that inserted the catheter for evaluation and possible removal. [facility] office ([phone] Interventional Radiology ([phone] Dr. OakGenevive [phone] [facility] ([phone]     Allergies as of 08/30/2021       Reactions   Peanut Allergen Powder-dnfp Anaphylaxis   Peanut-containing Drug Products Anaphylaxis, Shortness Of Breath,  Swelling   Peanuts... Anaphylaxis   Hydrochlorothiazide Other (See Comments)   Avoids , flares up Gout   Other Diarrhea   Splenda   Pistachio Nut (diagnostic) Swelling, Other (See Comments)   gout   Codeine Itching, Rash   Fruit & Vegetable Daily [nutritional Supplements] Rash   Papaya        Medication List     STOP taking these medications    hydrALAZINE 25 MG tablet Commonly known as: APRESOLINE   metFORMIN 500 MG 24 hr tablet Commonly known as: GLUCOPHAGE-XR   terazosin 5 MG capsule Commonly known as: HYTRIN       TAKE these medications    Accu-Chek Aviva Plus test strip Generic drug: glucose blood   Accu-Chek Softclix Lancets lancets USE TO TEST BLOOD SUGAR DAILY   acetaminophen 500 MG tablet Commonly known as: TYLENOL Take 500 mg by mouth every 6 (six) hours as needed for mild pain, moderate pain, fever or headache.   albuterol 108 (90 Base) MCG/ACT inhaler Commonly known as: VENTOLIN HFA Inhale 2 puffs into the lungs every 6 (six) hours as needed for wheezing or shortness of breath.   allopurinol 300 MG tablet Commonly known as: ZYLOPRIM Take 300 mg by mouth every evening.   amiodarone 200 MG tablet Commonly known  as: PACERONE Take 0.5 tablets (100 mg total) by mouth daily.   amLODipine-benazepril 5-20 MG capsule Commonly known as: LOTREL Take 1 capsule by mouth at bedtime.   colchicine 0.6 MG tablet Take 0.6 mg by mouth daily as needed (gout).   [employer] Sod Chloride Hypertonicity 5 % ophthalmic ointment Generic drug: sodium chloride Place 1 application into both eyes at bedtime. What changed: Another medication with the same name was removed. Continue taking this medication, and follow the directions you see here.   diphenhydrAMINE 25 MG tablet Commonly known as: BENADRYL Take 25 mg by mouth daily as needed for allergies. Reported on 06/19/2016   ferrous sulfate 325 (65 FE) MG tablet Take 325 mg by mouth every other day.   finasteride 5 MG  tablet Commonly known as: PROSCAR Take 5 mg by mouth daily.   Flaxseed Oil Max Str 1300 MG Caps Take 1 capsule by mouth daily.   folic acid 1 MG tablet Commonly known as: FOLVITE Take 3 mg by mouth every morning.   levothyroxine 50 MCG tablet Commonly known as: SYNTHROID Take 50 mcg by mouth daily.   loratadine 10 MG tablet Commonly known as: CLARITIN Take 10 mg by mouth every morning.   methotrexate 2.5 MG tablet Commonly known as: RHEUMATREX Take 12.5 mg by mouth every Friday. Take on Fridays.  Caution:Chemotherapy. Protect from light.   metoprolol succinate 25 MG 24 hr tablet Commonly known as: TOPROL-XL Take 1 tablet (25 mg total) by mouth daily.   mometasone-formoterol 200-5 MCG/ACT Aero Commonly known as: DULERA Inhale 2 puffs into the lungs 2 (two) times daily.   morphine CONCENTRATE 10 MG/0.5ML Soln concentrated solution Take 0.25 mLs (5 mg total) by mouth every 6 (six) hours for 5 days. For cancer pain   multivitamin with minerals Tabs tablet Take 1 tablet by mouth every evening.   omeprazole 20 MG tablet Commonly known as: PRILOSEC OTC Take 10 mg by mouth as needed.   ondansetron 4 MG tablet Commonly known as: ZOFRAN Take 1 tablet (4 mg total) by mouth every 6 (six) hours as needed for nausea.   rosuvastatin 10 MG tablet Commonly known as: CRESTOR Take 10 mg by mouth every Monday, Wednesday, and Friday.   senna 8.6 MG Tabs tablet Commonly known as: SENOKOT Take 1 tablet (8.6 mg total) by mouth at bedtime.   sertraline 100 MG tablet Commonly known as: ZOLOFT Take 50 mg by mouth every morning.   silodosin 8 MG Caps capsule Commonly known as: RAPAFLO Take 8 mg by mouth daily.   Vitamin D3 50 MCG (2000 UT) Tabs Take 2,000 Units by mouth every evening.               Durable Medical Equipment  (From admission, onward)           Start     Ordered   08/22/21 1614  For home use only DME 4 wheeled rolling walker with seat  Once        Question:  Patient needs a walker to treat with the following condition  Answer:  Weakness   08/22/21 1613              Discharge Care Instructions  (From admission, onward)           Start     Ordered   08/30/21 0000  Discharge wound care:       Comments: Dressing of Pleurx site per instruction   08/30/21 1241  Allergies  Allergen Reactions   Peanut Allergen Powder-Dnfp Anaphylaxis   Peanut-Containing Drug Products Anaphylaxis, Shortness Of Breath and Swelling    Peanuts... Anaphylaxis   Hydrochlorothiazide Other (See Comments)    Avoids , flares up Gout   Other Diarrhea    Splenda   Pistachio Nut (Diagnostic) Swelling and Other (See Comments)    gout   Codeine Itching and Rash   Fruit & Vegetable Daily [Nutritional Supplements] Rash    Papaya    Follow-up Information     [facility]   Specialty: Home Health Services Why: for home health services. They will call Diane to set up first home appointment Contact information: [address] [place] [address] [phone]                  The results of significant diagnostics from this hospitalization (including imaging, microbiology, ancillary and laboratory) are listed below for reference.    Significant Diagnostic Studies: DG Chest 1 View  Result Date: 08/14/2021 CLINICAL DATA:  Status post left thoracentesis EXAM: CHEST  1 VIEW COMPARISON:  Chest radiograph 08/13/2021 FINDINGS: Median sternotomy wires and mediastinal surgical clips are stable. The cardiomediastinal silhouette is grossly stable. Perihilar opacities likely correspond to lymphadenopathy in the left perihilar mass seen on the prior CT. The left pleural effusion has significantly decreased in size following thoracentesis with improved aeration of the left lung. There is no appreciable pneumothorax. Streaky opacities in the right base likely reflect atelectasis. The right lung is otherwise  clear. There is no right pleural effusion or pneumothorax. IMPRESSION: Decreased left pleural effusion with improved aeration of the left lung following thoracentesis. No pneumothorax. Electronically Signed   By: Valetta Mole M.D.   On: 08/14/2021 12:11   DG Chest 1 View  Result Date: 08/13/2021 CLINICAL DATA:  Shortness of breath. EXAM: CHEST  1 VIEW COMPARISON:  11/28/2018 FINDINGS: The cardio pericardial silhouette is enlarged. Interstitial markings are diffusely coarsened with chronic features. Right base collapse/consolidation noted with moderate right pleural effusion. Soft tissue fullness noted left suprahilar region. Patchy airspace disease noted right base. Bones are diffusely demineralized. Status post CABG. IMPRESSION: Left base collapse/consolidation with left suprahilar fullness and moderate left effusion. Given asymmetry of findings, underlying neoplasm a concern. Chest CT with contrast recommended to further evaluate. Electronically Signed   By: Misty Stanley M.D.   On: 08/13/2021 13:00   DG Chest 2 View  Result Date: 08/19/2021 CLINICAL DATA:  Pleural effusion EXAM: CHEST - 2 VIEW COMPARISON:  Chest radiograph 08/14/2021 FINDINGS: Median sternotomy wires and mediastinal surgical clips are again seen. The cardiomediastinal silhouette is grossly similar, though suboptimally evaluated on the current study. The left pleural effusion has increased in size with worsening aeration of the left lung. Left perihilar opacities are not significantly changed. The right lung is spared, with no new focal airspace opacity. There is no right pleural effusion. There is no pneumothorax. Marked degenerative change of both shoulders is again seen. There is no acute osseous abnormality. IMPRESSION: Increased left pleural effusion with worsened opacity in the left lung. Electronically Signed   By: Valetta Mole M.D.   On: 08/19/2021 10:31   CT Chest W Contrast  Result Date: 08/13/2021 CLINICAL DATA:  Shortness of  breath EXAM: CT CHEST WITH CONTRAST TECHNIQUE: Multidetector CT imaging of the chest was performed during intravenous contrast administration. CONTRAST:  72m OMNIPAQUE IOHEXOL 350 MG/ML SOLN COMPARISON:  Chest radiograph dated 08/13/2021 FINDINGS: Cardiovascular: The heart  is normal in size. No pericardial effusion. No evidence of thoracic aortic aneurysm. Atherosclerotic calcifications of the aortic arch. Coronary atherosclerosis of the LAD and right coronary artery. Postsurgical changes related to prior CABG. Mediastinum/Nodes: Thoracic lymphadenopathy, including: --16 mm short axis node at the right thoracic inlet (series 3/image 26) --19 mm short axis AP window node (series 3/image 59) --18 mm short axis right paratracheal node (series 3/image 62) --20 mm short axis right hilar node (series 3/image 79) --23 mm short axis subcarinal node (series 3/image 82) --22 mm short axis left perihilar node (series 3/image 87) Visualized thyroid is unremarkable. Lungs/Pleura: 4.0 x 2.4 cm mass in the medial left upper lobe/perihilar region (series 3/image 67). Additional tumor along the left mediastinum (series 3/image 56) and extending to the left hilum. Associated lingular and left lower lobe postobstructive opacity/atelectasis, underlying tumor not excluded. Moderate left pleural effusion. Some degree of pleural-based nodularity anteriorly (series 3/image 105) and along the posteromedial base (series 3/image 129) suggests a malignant pleural effusion. Mild centrilobular and paraseptal emphysematous changes. Biapical pleural-parenchymal scarring. Mild subpleural reticulation/fibrosis in the right lower lobe. Trace right pleural fluid. No pneumothorax. Upper Abdomen: Visualized upper abdomen is notable for bilateral renal cysts and vascular calcifications. Musculoskeletal: Degenerative changes of the visualized thoracolumbar spine. Median sternotomy. IMPRESSION: 4.0 cm medial left upper lobe/perihilar mass, extending into  the left hilum, compatible with primary bronchogenic neoplasm. Associated thoracic nodal metastases. Postobstructive opacity/atelectasis in the lingula and left lower lobe. Moderate left pleural effusion, favored to be malignant. Aortic Atherosclerosis (ICD10-I70.0) and Emphysema (ICD10-J43.9). Electronically Signed   By: Julian Hy M.D.   On: 08/13/2021 20:10   CT ABDOMEN PELVIS W CONTRAST  Result Date: 08/27/2021 CLINICAL DATA:  Hematologic malignancy, staging EXAM: CT ABDOMEN AND PELVIS WITH CONTRAST TECHNIQUE: Multidetector CT imaging of the abdomen and pelvis was performed using the standard protocol following bolus administration of intravenous contrast. CONTRAST:  41m OMNIPAQUE IOHEXOL 350 MG/ML SOLN COMPARISON:  Chest CT dated August 13, 2021 FINDINGS: Lower Chest: Partially imaged small right and moderate left pleural effusions with left pleural thickening, measuring up to 1.5 cm in thickness. Left-greater-than-right bibasilar atelectasis. Hepatobiliary: No focal liver abnormality is seen. Status post cholecystectomy. No biliary dilatation. Pancreas: Atrophic pancreas. No ductal dilation or surrounding inflammatory changes. Spleen: Normal in size without focal abnormality. Spleen measures 10.0 cm in length. Adrenals/Urinary Tract: Bilateral adrenal glands are unremarkable. Bilateral cystic renal lesions, largest are compatible with simple cysts, others are too small to completely characterize. Wedge-shaped area of hypoperfusion seen in the lower pole of the right kidney. No hydronephrosis. Bladder wall thickening. Diverticulum of the right lateral bladder wall. Stomach/Bowel: Small hiatal hernia. Numerous diverticula of the sigmoid colon. Appendix is not definitely visualized. No bowel wall thickening or evidence of obstruction. Vascular/Lymphatic: Aortic atherosclerosis. Enlarged retroperitoneal, pelvic and inguinal lymph nodes. Reference left periaortic lymph node measuring 1.2 cm in short  axis on series 3, image 39. Reference right pelvic sidewall lymph node measuring 1.1 cm in short axis on series 3, image 68. Reference right inguinal lymph node measuring 1.2 cm in short axis on series 3, image 81. Reproductive: Prostatomegaly, measuring up to 5.8 cm. Other: Small volume abdominal ascites with omental nodularity and peritoneal thickening and enhancement. Musculoskeletal: Unchanged sclerotic changes of the L1 and L2 vertebral bodies when compared with 2017 CT, favored to be degenerative. No aggressive appearing osseous lesions. IMPRESSION: Partially visualized lung bases demonstrate increased small right and moderate left pleural effusions with unchanged nodular pleural thickening. Mildly  enlarged retroperitoneal, pelvic and inguinal lymph nodes. Small volume abdominal ascites with omental nodularity and peritoneal thickening, likely due to peritoneal lymphomatosis. Wedge-shaped area of hypoperfusion seen in the lower pole of the right kidney, differential includes infection versus infarct. Correlate with urinalysis. Bladder wall thickening and prostatomegaly, likely due to chronic outlet obstruction. Aortic Atherosclerosis (ICD10-I70.0). Electronically Signed   By: Yetta Glassman M.D.   On: 08/27/2021 14:43   DG CHEST PORT 1 VIEW  Result Date: 08/28/2021 CLINICAL DATA:  Left-sided pleural drainage catheter. Malignant pleural effusion. EXAM: PORTABLE CHEST 1 VIEW COMPARISON:  08/26/2021 FINDINGS: Midline trachea. Mild cardiomegaly. A left-sided pleural catheter terminates over the inferolateral left pleural space. A small pneumothorax is seen, on the order of 5-10%. Small volume left-sided pleural fluid as well. Soft tissue fullness within both hila, secondary to adenopathy on prior CT. Worsened aeration, with moderate interstitial edema. Lower lung predominant airspace disease is similar on the left and progressive on the right. IMPRESSION: Placement of a left-sided pleural drain with  development of a 5-10% pneumothorax. Worsened aeration with developing congestive heart failure. Similar left and progressive right base airspace disease which could represent atelectasis or concurrent infection or aspiration. Similar small volume left pleural fluid. Electronically Signed   By: Abigail Miyamoto M.D.   On: 08/28/2021 16:40   DG CHEST PORT 1 VIEW  Result Date: 08/26/2021 CLINICAL DATA:  Status post thoracentesis EXAM: PORTABLE CHEST 1 VIEW COMPARISON:  08/25/2021 FINDINGS: Single frontal view of the chest demonstrates an unremarkable cardiac silhouette. Postsurgical changes from prior CABG. Persistent but decreased left pleural effusion and residual left basilar atelectasis. The left hilar mass seen on prior CT is again identified consistent with bronchogenic malignancy. Interval improvement in aeration at the right lung base. No right-sided effusion or pneumothorax. No acute bony abnormalities. IMPRESSION: 1. Decreased left pleural effusion after thoracentesis. Small residual left effusion and left basilar atelectasis. 2. Persistent left hilar mass consistent with bronchogenic malignancy seen on prior CT. 3. Improved aeration at the right lung base. Electronically Signed   By: Randa Ngo M.D.   On: 08/26/2021 15:36   DG CHEST PORT 1 VIEW  Result Date: 08/25/2021 CLINICAL DATA:  Follow-up pleural effusion EXAM: PORTABLE CHEST 1 VIEW COMPARISON:  08/19/2021 FINDINGS: Cardiac shadow is stable. Postsurgical changes are again seen. Increasing bibasilar infiltrates are noted left greater than right with associated left-sided effusion which has also increased in the interval. No acute bony abnormality is noted. IMPRESSION: Increasing bibasilar infiltrates with associated left-sided effusion of a moderate to large size. Electronically Signed   By: Inez Catalina M.D.   On: 08/25/2021 13:01   DG CHEST PORT 1 VIEW  Result Date: 08/19/2021 CLINICAL DATA:  Status post thoracentesis EXAM: PORTABLE CHEST  1 VIEW COMPARISON:  08/19/2021 at 7:08 a.m. FINDINGS: 3:35 p.m. High riding humeral heads, consistent with chronic rotator cuff insufficiency. Midline trachea. Mild cardiomegaly. Prior median sternotomy. Atherosclerosis in the transverse aorta. Small left pleural effusion, decreased. No pneumothorax. Interstitial edema, moderate. Improved left base and worsened right base airspace disease. IMPRESSION: No pneumothorax or other acute complication after thoracentesis. Decreased left pleural effusion with improved adjacent airspace disease, atelectasis versus infection. Underlying congestive heart failure with developing right base airspace disease, favoring atelectasis or alveolar edema. Aortic Atherosclerosis (ICD10-I70.0). Electronically Signed   By: Abigail Miyamoto M.D.   On: 08/19/2021 16:01   IR THORACENTESIS ASP PLEURAL SPACE W/IMG GUIDE  Result Date: 08/14/2021 INDICATION: Patient with shortness of breath, left pleural effusion. Request is  made for diagnostic and therapeutic thoracentesis. EXAM: ULTRASOUND GUIDED LEFT DIAGNOSTIC AND THERAPEUTIC THORACENTESIS MEDICATIONS: 10 mL 1% lidocaine COMPLICATIONS: None immediate. PROCEDURE: An ultrasound guided thoracentesis was thoroughly discussed with the patient and questions answered. The benefits, risks, alternatives and complications were also discussed. The patient understands and wishes to proceed with the procedure. Written consent was obtained. Ultrasound was performed to localize and mark an adequate pocket of fluid in the left chest. The area was then prepped and draped in the normal sterile fashion. 1% Lidocaine was used for local anesthesia. Under ultrasound guidance a 6 Fr Safe-T-Centesis catheter was introduced. Thoracentesis was performed. The catheter was removed and a dressing applied. FINDINGS: A total of approximately 1.3 liters of amber fluid was removed. Samples were sent to the laboratory as requested by the clinical team. IMPRESSION: Successful  ultrasound guided diagnostic and therapeutic left thoracentesis yielding 1.3 liters of pleural fluid. Read by: Brynda Greathouse PA-C Electronically Signed   By: Jacqulynn Cadet M.D.   On: 08/14/2021 12:00    Microbiology: No results found for this or any previous visit (from the past 240 hour(s)).   Labs: Basic Metabolic Panel: Recent Labs  Lab 08/24/21 0227 08/25/21 0521 08/26/21 0139 08/27/21 0210 08/27/21 0820  NA 140 141 138 141  --   K 4.0 3.6 3.9 3.8  --   CL 115* 118* 117* 119*  --   CO2 19* 18* 16* 16*  --   GLUCOSE 148* 142* 151* 122*  --   BUN 25* 15 12 14   --   CREATININE 1.54* 1.26* 1.25* 1.22  --   CALCIUM 8.0* 7.6* 7.8* 7.5*  --   PHOS  --   --   --   --  1.2*   Liver Function Tests: Recent Labs  Lab 08/24/21 0227 08/25/21 0521 08/26/21 0139  AST 69* 30 35  ALT 31 27 29   ALKPHOS 90 76 74  BILITOT 0.6 0.7 1.0  PROT 5.0* 4.9* 5.2*  ALBUMIN 2.0* 2.3* 2.4*   No results for input(s): LIPASE, AMYLASE in the last 168 hours. No results for input(s): AMMONIA in the last 168 hours. CBC: Recent Labs  Lab 08/26/21 0139 08/27/21 0210  WBC 11.0* 10.3  NEUTROABS 8.6* 8.2*  HGB 12.1* 11.7*  HCT 36.9* 36.0*  MCV 107.6* 107.5*  PLT 277 256   Cardiac Enzymes: No results for input(s): CKTOTAL, CKMB, CKMBINDEX, TROPONINI in the last 168 hours. BNP: BNP (last 3 results) Recent Labs    08/13/21 1227  BNP 163.6*    ProBNP (last 3 results) No results for input(s): PROBNP in the last 8760 hours.  CBG: Recent Labs  Lab 08/26/21 1210 08/26/21 1616 08/26/21 1948 08/27/21 0803 08/27/21 1203  GLUCAP 119* 186* 102* 125* 148*       Signed:  Cristal Deer, MD [facility] 08/30/2021, 12:42 PM

## 2021-08-30 NOTE — Progress Notes (Signed)
   Palliative Medicine Inpatient Follow Up Note  Reason for Consultation: GOC and symptom management   HPI/Patient Profile: 87 y.o. male  with past medical history of CAD s/p CABG, paroxysmal SVT, HTN, DM2, HTN, HLD, hypothyroid, depression, BPH admitted on 08/13/2021 with dyspnea on exertion. Thorough workup has revealed lung mass with reaccumuluating pleural effusions. Biopsy of lymph nodes indicates diffuse large B-Cell lymphoma. Oncology consulted and he is not a candidate for treatment. Palliative consulted for GOC and symptom management.   Today's Discussion (08/30/2021):  *Please note that this is a verbal dictation therefore any spelling or grammatical errors are due to the "[place] [employer]" system interpretation.  Chart reviewed.    I met with Legion at bedside this morning. He is in good spirits though complains of shortness of breath. His O2 was on the other side of his bed, I was able to place it back into his nose and he expressed relief. Zackory complains of little to no appetite this morning.  We discussed the plan for discharge this morning, Davidmichael responded "I'm ready, can I get dressed" I shared that he would first need for the [facility] care team to confirm the ambulance time.   Otherwise Milferd denies pain and nausea this morning.   Questions and concerns addressed/palliative support provided  Objective Assessment: Vital Signs Vitals:   08/29/21 1955 08/30/21 0300  BP: (!) 112/59 (!) 102/58  Pulse: 69 72  Resp: 16 16  Temp: 98 F (36.7 C) 98 F (36.7 C)  SpO2: 95% 96%    Intake/Output Summary (Last 24 hours) at 08/30/2021 9485 Last data filed at 08/30/2021 0600 Gross per 24 hour  Intake 120 ml  Output 800 ml  Net -680 ml    Last Weight  Most recent update: 08/13/2021 10:27 PM    Weight  65.8 kg (145 lb 1 oz)            Physical Exam Vitals and nursing note reviewed.  Pulmonary:     Comments: Dyspnea at rest Neurological:     Mental Status: He is alert.     Motor:  Weakness present.     Comments: Hard of hearing, hearing aids in place   SUMMARY OF RECOMMENDATIONS   DNAR/DNI  S/P PleurX placement 9/16  Comfort medications ordered-  $RemoveB'5mg'VbanoIGd$  morphine concentrate SL Q6H ATC and $Remov'10mg'dqfHWZ$  q1hr prn SOB, air hunger Lorazepam $RemoveBeforeDE'1mg'UTkszZizSElBnIx$  po, SL or IV q4 hr anxiety  Plan to transition home with [facility] today  Time Spent: 25 Greater than 50% of the time was spent in counseling and coordination of care ______________________________________________________________________________________ [place] Team Team Cell Phone: [phone] Please utilize secure chat with additional questions, if there is no response within 30 minutes please call the above phone number  Palliative Medicine Team providers are available by phone from 7am to 7pm daily and can be reached through the team cell phone.  Should this patient require assistance outside of these hours, please call the patient's attending physician.

## 2021-08-30 NOTE — TOC Transition Note (Addendum)
Transition of Care [facility]) - CM/SW Discharge Note   Patient Details  Name: KYREE PERREN MRN: [id number] Date of Birth: [birth year]  [facility] CM/SW Contact:  Coralee Pesa, [place] Phone Number: 08/30/2021, 9:30 AM   Clinical Narrative:    Pt to be transported home with hospice care via [facility]. [facility] transport will arranged by nurse. TOC to sign off at this time.   Final next level of care: Home w Hospice Care Barriers to Discharge: Barriers Resolved   Patient Goals and CMS Choice Patient states their goals for this hospitalization and ongoing recovery are:: to go home CMS Medicare.gov Compare Post Acute Care list provided to:: Other (Comment Required) Choice offered to / list presented to : Adult Children  Discharge Placement                  Name of family member notified: Diane Patient and family notified of of transfer: 08/30/21  Discharge Plan and Services In-house Referral: NA Discharge Planning Services: CM Consult            DME Arranged: Gilford Rile rolling with seat DME Agency: [employer] Date DME Agency Contacted: 08/22/21 Time DME Agency Contacted: [phone] Representative spoke with at DME Agency: [place]: RN, PT, OT, Nurse's Aide [place] Agency: [place] Date [place]: 08/22/21 Time [place]: 1614 Representative spoke with at [place]: [place] Interventions     Readmission Risk Interventions Readmission Risk Prevention Plan 08/24/2021  Transportation Screening Complete  PCP or Specialist Appt within 3-5 Days Complete  HRI or [place] Complete  Social Work Consult for [place] Planning/Counseling Complete  Palliative Care Screening Complete  Medication Review Press photographer) Complete  Some recent data might be hidden

## 2021-09-12 DEATH — deceased

## 2021-11-27 ENCOUNTER — Ambulatory Visit: Payer: Medicare Other | Admitting: Cardiology

## 2023-08-05 IMAGING — CT CT ABD-PELV W/ CM
2 of 5 series · 16 of 46 positions shown, 18 images · IV contrast (Omni 300)
Comparison: Chest CT dated August 13, 2021

CLINICAL DATA: Hematologic malignancy, staging

EXAM:
CT ABDOMEN AND PELVIS WITH CONTRAST
TECHNIQUE: Multidetector CT imaging of the abdomen and pelvis was performed
using the standard protocol following bolus administration of
intravenous contrast.
CONTRAST:  80mL OMNIPAQUE IOHEXOL 350 MG/ML SOLN

[Series 3: a/p w/ 5mm · axial · 0.80mm/px · z∈[-433,-33]mm · 13 of 91 slices shown, 15 images]
[im 6/91  soft-tissue]
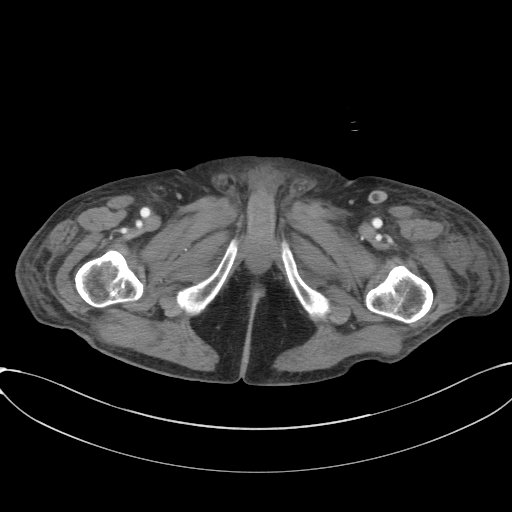
[im 6/91  bone]
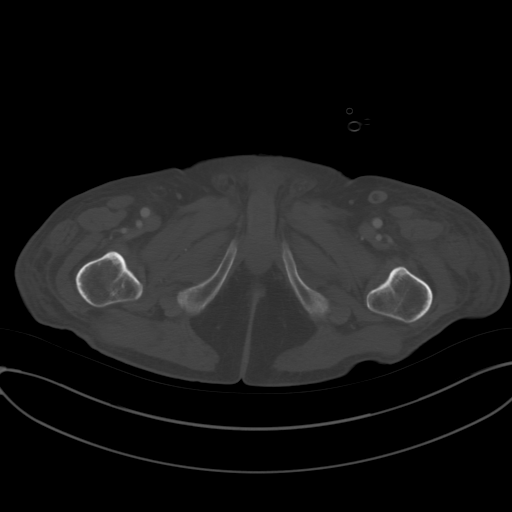
[im 11/91  soft-tissue]
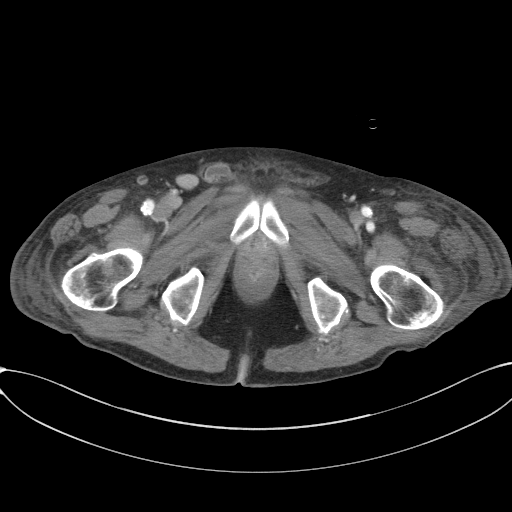
[im 21/91  soft-tissue]
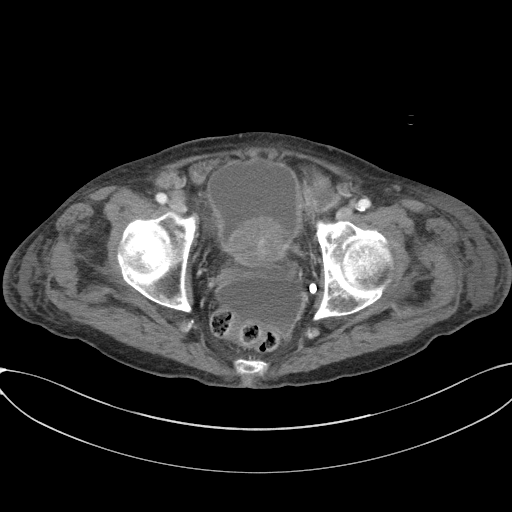
[im 26/91  soft-tissue]
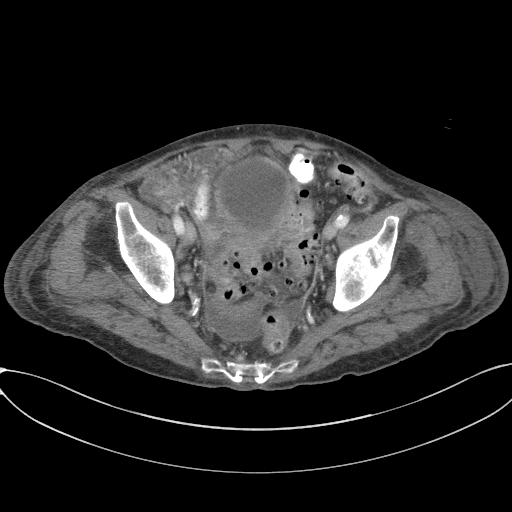
[im 31/91  soft-tissue]
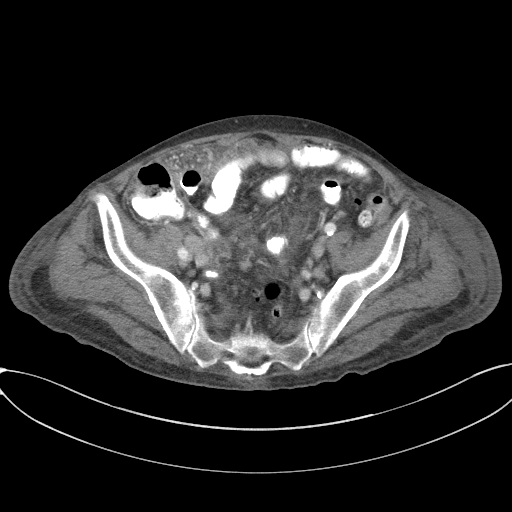
[im 41/91  soft-tissue]
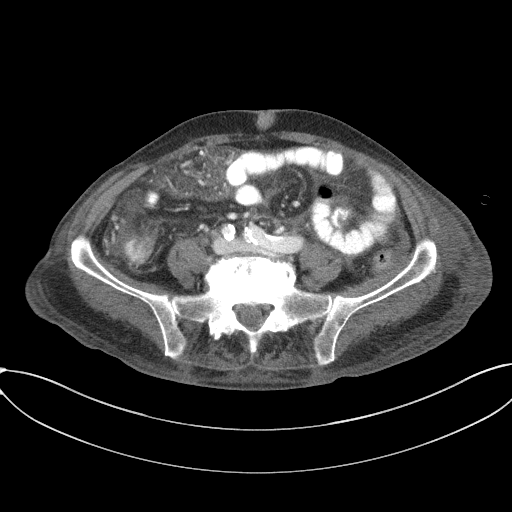
[im 46/91  soft-tissue]
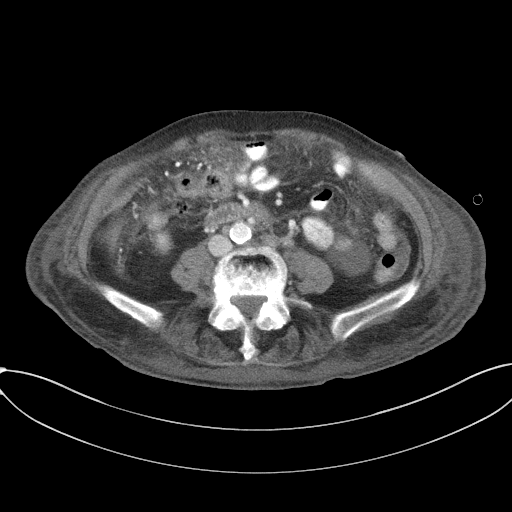
[im 51/91  soft-tissue]
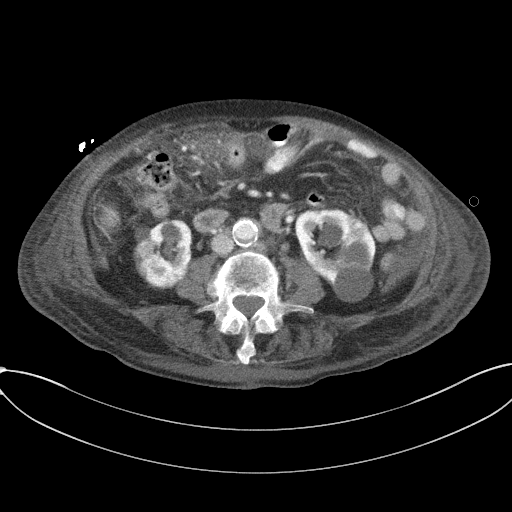
[im 61/91  soft-tissue]
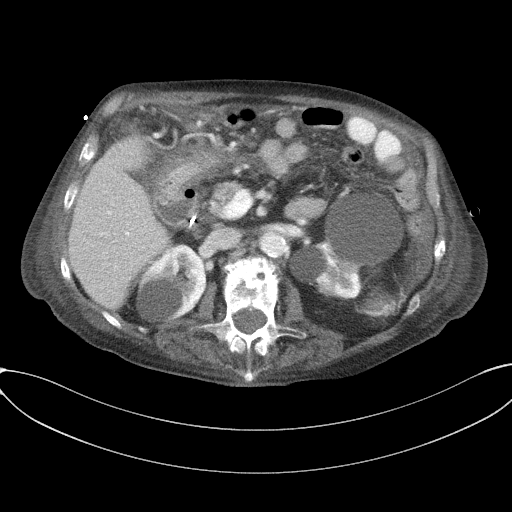
[im 61/91  bone]
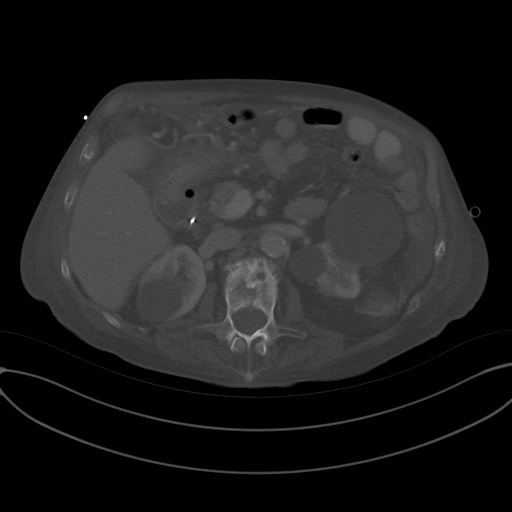
[im 66/91  soft-tissue]
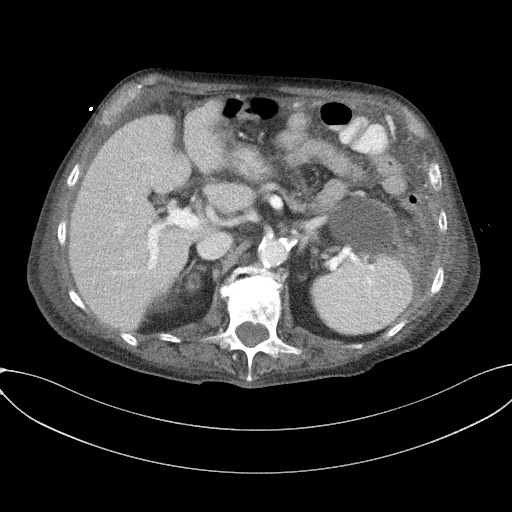
[im 71/91  soft-tissue]
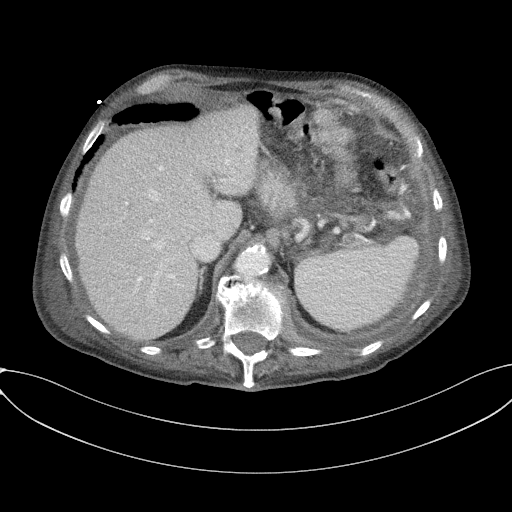
[im 81/91  soft-tissue]
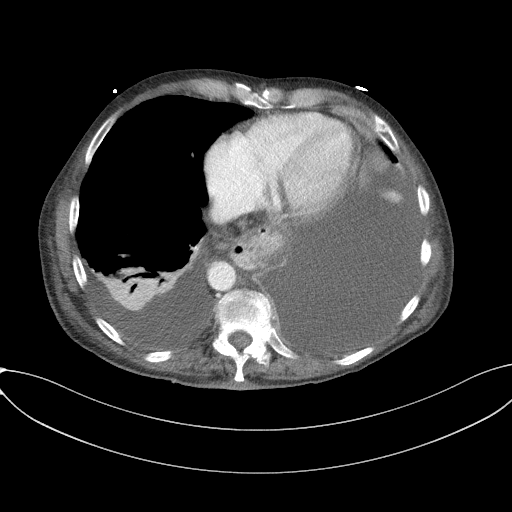
[im 86/91  soft-tissue]
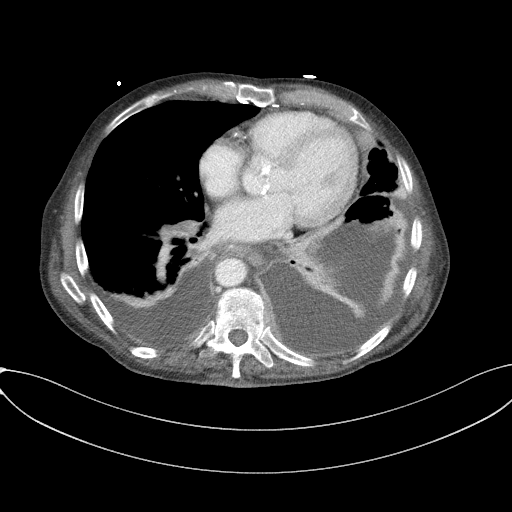

[Series 6: a/p w/ cor · coronal · 0.86mm/px · 3 of 134 slices shown]
[im 45/134  soft-tissue]
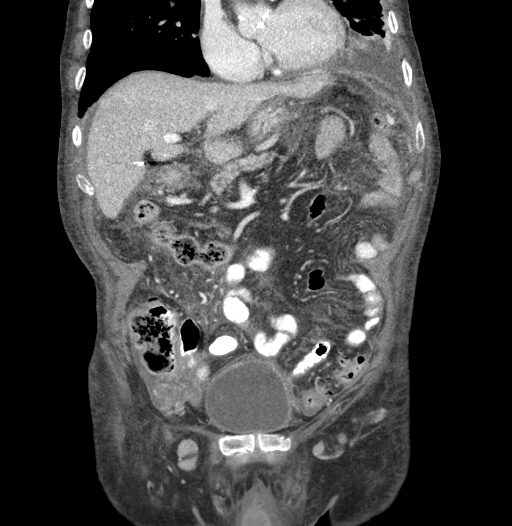
[im 60/134  soft-tissue]
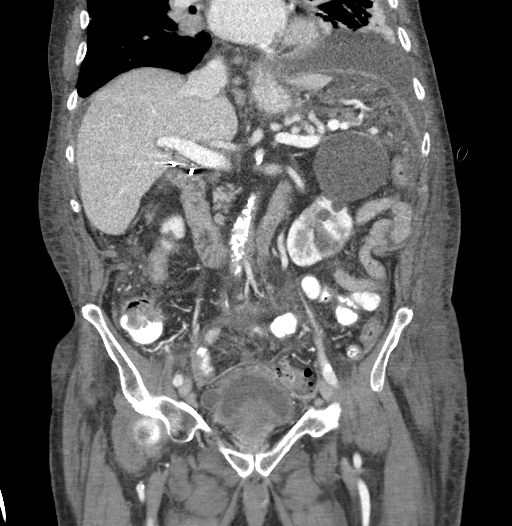
[im 74/134  soft-tissue]
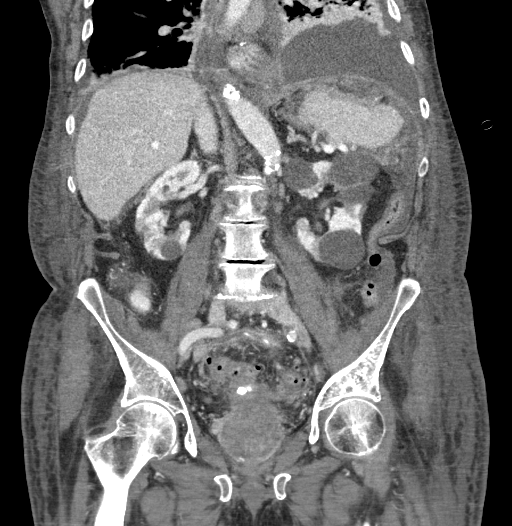

[16 of 46 positions shown; findings below may reference images not displayed]

FINDINGS: Lower Chest: Partially imaged small right and moderate left pleural
effusions with left pleural thickening, measuring up to 1.5 cm in
thickness. Left-greater-than-right bibasilar atelectasis.

Hepatobiliary: No focal liver abnormality is seen. Status post
cholecystectomy. No biliary dilatation.

Pancreas: Atrophic pancreas. No ductal dilation or surrounding
inflammatory changes.

Spleen: Normal in size without focal abnormality. Spleen measures
10.0 cm in length.

Adrenals/Urinary Tract: Bilateral adrenal glands are unremarkable.
Bilateral cystic renal lesions, largest are compatible with simple
cysts, others are too small to completely characterize. Wedge-shaped
area of hypoperfusion seen in the lower pole of the right kidney. No
hydronephrosis. Bladder wall thickening. Diverticulum of the right
lateral bladder wall.

Stomach/Bowel: Small hiatal hernia. Numerous diverticula of the
sigmoid colon. Appendix is not definitely visualized. No bowel wall
thickening or evidence of obstruction.

Vascular/Lymphatic: Aortic atherosclerosis. Enlarged
retroperitoneal, pelvic and inguinal lymph nodes. Reference left
periaortic lymph node measuring 1.2 cm in short axis on series 3,
image 39. Reference right pelvic sidewall lymph node measuring
cm in short axis on series 3, image 68. Reference right inguinal
lymph node measuring 1.2 cm in short axis on series 3, image 81.

Reproductive: Prostatomegaly, measuring up to 5.8 cm.

Other: Small volume abdominal ascites with omental nodularity and
peritoneal thickening and enhancement.

Musculoskeletal: Unchanged sclerotic changes of the L1 and L2
vertebral bodies when compared with 5971 CT, favored to be
degenerative. No aggressive appearing osseous lesions.
IMPRESSION: Partially visualized lung bases demonstrate increased small right
and moderate left pleural effusions with unchanged nodular pleural
thickening.

Mildly enlarged retroperitoneal, pelvic and inguinal lymph nodes.

Small volume abdominal ascites with omental nodularity and
peritoneal thickening, likely due to peritoneal lymphomatosis.

Wedge-shaped area of hypoperfusion seen in the lower pole of the
right kidney, differential includes infection versus infarct.
Correlate with urinalysis.

Bladder wall thickening and prostatomegaly, likely due to chronic
outlet obstruction.

Aortic Atherosclerosis ([id number]).

## 2023-08-06 IMAGING — DX DG CHEST 1V PORT
1 series · 1 of 1 positions shown · non-contrast
Comparison: 08/26/2021

CLINICAL DATA: Left-sided pleural drainage catheter. Malignant
pleural effusion.

EXAM:
PORTABLE CHEST 1 VIEW

[chest ap]
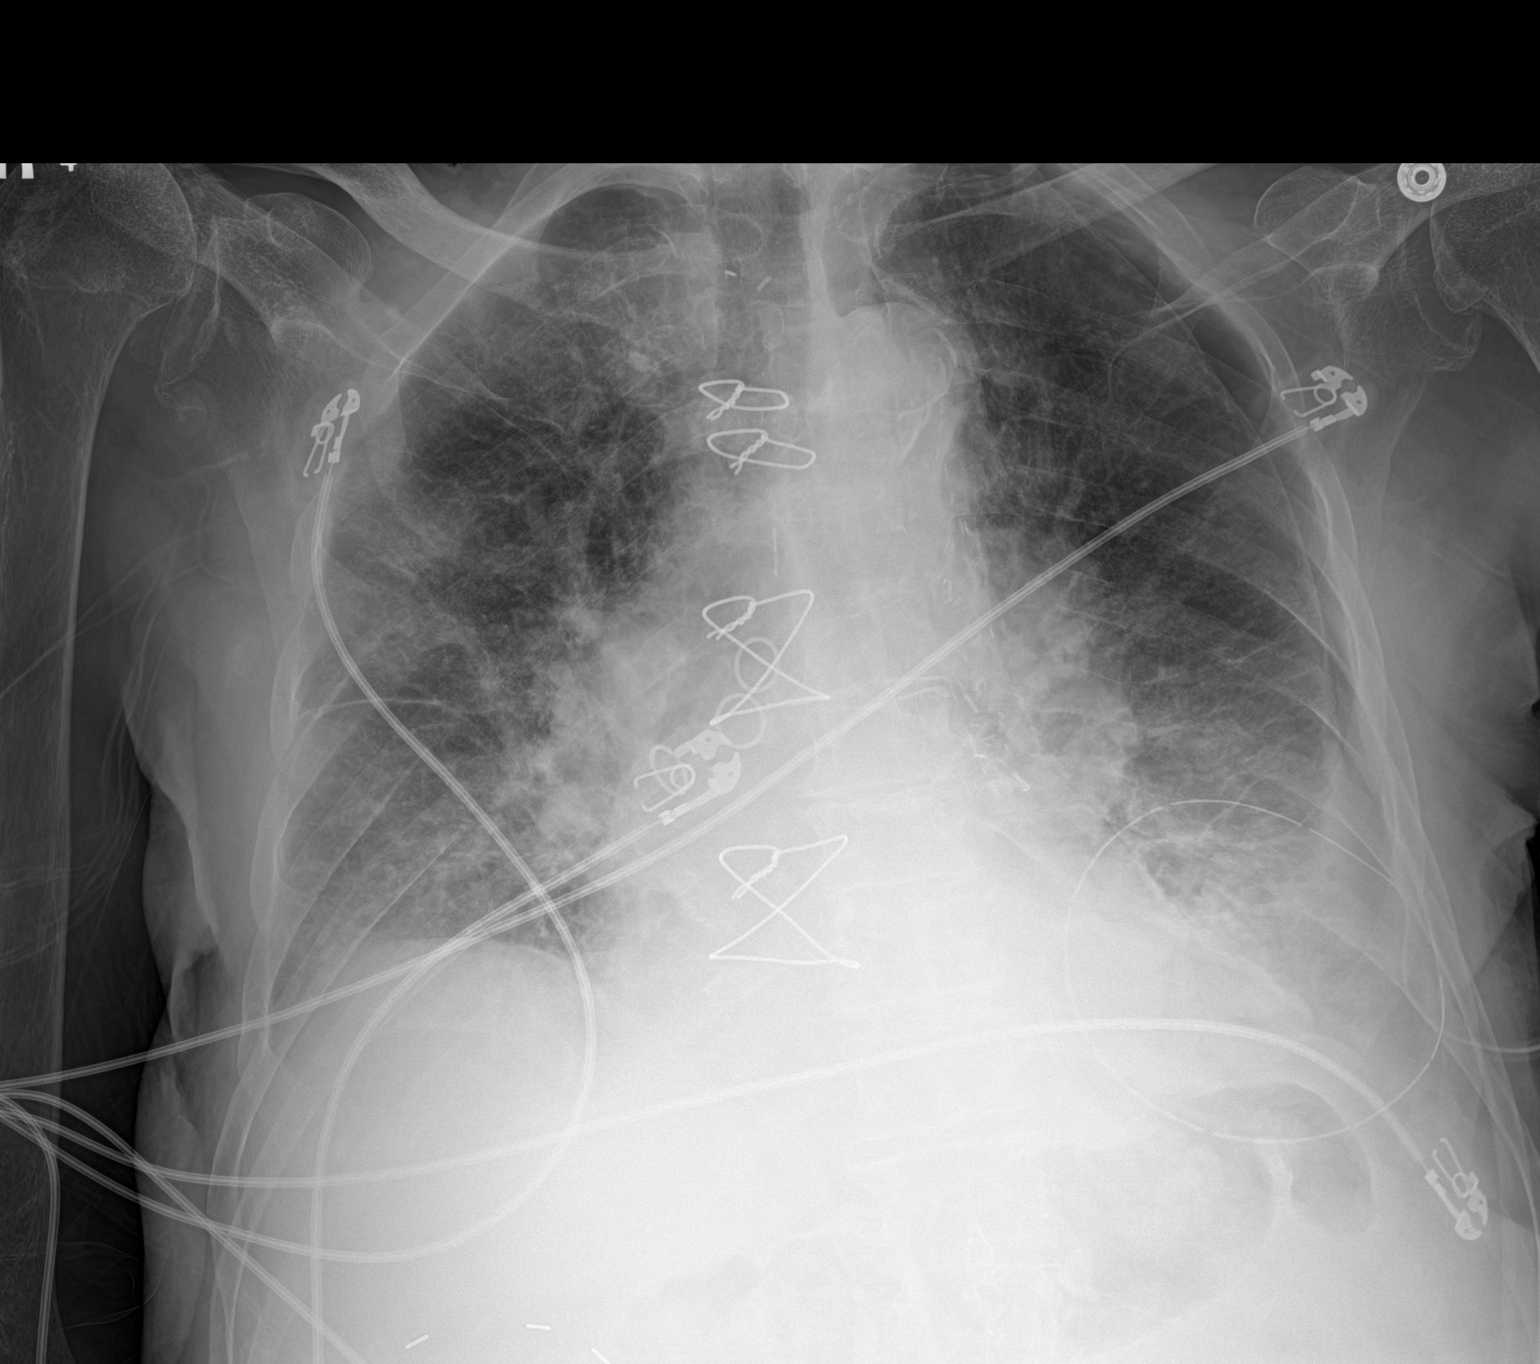

[1 of 1 positions shown; findings below may reference images not displayed]

FINDINGS: Midline trachea. Mild cardiomegaly. A left-sided pleural catheter
terminates over the inferolateral left pleural space. A small
pneumothorax is seen, on the order of 5-10%. Small volume left-sided
pleural fluid as well.

Soft tissue fullness within both hila, secondary to adenopathy on
prior CT. Worsened aeration, with moderate interstitial edema. Lower
lung predominant airspace disease is similar on the left and
progressive on the right.
IMPRESSION: Placement of a left-sided pleural drain with development of a 5-10%
pneumothorax.

Worsened aeration with developing congestive heart failure.

Similar left and progressive right base airspace disease which could
represent atelectasis or concurrent infection or aspiration.

Similar small volume left pleural fluid.
